# Patient Record
Sex: Female | Born: 1990 | Race: White | Hispanic: No | Marital: Single | Smoking: Current every day smoker
Health system: Southern US, Community
[De-identification: ages and names within clinical notes are randomized; demographics above are authoritative.]

## PROBLEM LIST (undated history)

## (undated) ENCOUNTER — Emergency Department (HOSPITAL_COMMUNITY): Admission: EM | Payer: Commercial Managed Care - PPO | Source: Home / Self Care

## (undated) ENCOUNTER — Emergency Department (HOSPITAL_COMMUNITY): Admission: EM | Discharge status: Absent without leave | Payer: Commercial Managed Care - PPO

## (undated) DIAGNOSIS — F419 Anxiety disorder, unspecified: Secondary | ICD-10-CM

## (undated) DIAGNOSIS — Q211 Atrial septal defect, unspecified: Secondary | ICD-10-CM

## (undated) DIAGNOSIS — F1910 Other psychoactive substance abuse, uncomplicated: Secondary | ICD-10-CM

## (undated) DIAGNOSIS — F329 Major depressive disorder, single episode, unspecified: Secondary | ICD-10-CM

## (undated) DIAGNOSIS — Z7289 Other problems related to lifestyle: Secondary | ICD-10-CM

## (undated) DIAGNOSIS — F489 Nonpsychotic mental disorder, unspecified: Secondary | ICD-10-CM

## (undated) DIAGNOSIS — R011 Cardiac murmur, unspecified: Secondary | ICD-10-CM

## (undated) DIAGNOSIS — F32A Depression, unspecified: Secondary | ICD-10-CM

## (undated) DIAGNOSIS — F319 Bipolar disorder, unspecified: Secondary | ICD-10-CM

## (undated) HISTORY — DX: Major depressive disorder, single episode, unspecified: F32.9

## (undated) HISTORY — DX: Other psychoactive substance abuse, uncomplicated: F19.10

## (undated) HISTORY — PX: ASD REPAIR: SHX258

## (undated) HISTORY — DX: Anxiety disorder, unspecified: F41.9

## (undated) HISTORY — DX: Depression, unspecified: F32.A

## (undated) HISTORY — DX: Cardiac murmur, unspecified: R01.1

---
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
## 2010-02-16 ENCOUNTER — Encounter: Payer: Self-pay | Admitting: Obstetrics

## 2010-02-25 NOTE — Miscellaneous (Signed)
  Clinical Lists Changes  Problems: Removed problem of ACUTE BRONCHITIS (ICD-466.0) Removed problem of ANKLE SPRAIN, RIGHT (ICD-845.00) Removed problem of Question of  ABSCESS, PERITONSILLAR, RIGHT (ICD-475) Removed problem of SORE THROAT (ICD-462) Removed problem of DIARRHEA OF PRESUMED INFECTIOUS ORIGIN (ICD-009.3)

## 2010-03-05 ENCOUNTER — Encounter: Payer: Self-pay | Admitting: *Deleted
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
## 2010-06-30 ENCOUNTER — Emergency Department (HOSPITAL_COMMUNITY)
Admission: EM | Admit: 2010-06-30 | Discharge: 2010-06-30 | Disposition: A | Discharge status: Home / Self Care | Payer: BC Managed Care – PPO | Attending: Emergency Medicine | Admitting: Emergency Medicine

## 2010-06-30 DIAGNOSIS — F329 Major depressive disorder, single episode, unspecified: Secondary | ICD-10-CM | POA: Insufficient documentation

## 2010-06-30 DIAGNOSIS — T424X1A Poisoning by benzodiazepines, accidental (unintentional), initial encounter: Secondary | ICD-10-CM | POA: Insufficient documentation

## 2010-06-30 DIAGNOSIS — T43694A Poisoning by other psychostimulants, undetermined, initial encounter: Secondary | ICD-10-CM | POA: Insufficient documentation

## 2010-06-30 DIAGNOSIS — T424X4A Poisoning by benzodiazepines, undetermined, initial encounter: Secondary | ICD-10-CM | POA: Insufficient documentation

## 2010-06-30 DIAGNOSIS — Z79899 Other long term (current) drug therapy: Secondary | ICD-10-CM | POA: Insufficient documentation

## 2010-06-30 DIAGNOSIS — T43601A Poisoning by unspecified psychostimulants, accidental (unintentional), initial encounter: Secondary | ICD-10-CM | POA: Insufficient documentation

## 2010-06-30 DIAGNOSIS — F3289 Other specified depressive episodes: Secondary | ICD-10-CM | POA: Insufficient documentation

## 2010-06-30 DIAGNOSIS — R1110 Vomiting, unspecified: Secondary | ICD-10-CM | POA: Insufficient documentation

## 2010-06-30 LAB — URINALYSIS, ROUTINE W REFLEX MICROSCOPIC
Bilirubin Urine: NEGATIVE
Glucose, UA: NEGATIVE mg/dL
Hgb urine dipstick: NEGATIVE
Ketones, ur: NEGATIVE mg/dL
Protein, ur: NEGATIVE mg/dL
Specific Gravity, Urine: 1.026 (ref 1.005–1.030)
pH: 5.5 (ref 5.0–8.0)

## 2010-06-30 LAB — COMPREHENSIVE METABOLIC PANEL
Albumin: 4 g/dL (ref 3.5–5.2)
Alkaline Phosphatase: 58 U/L (ref 39–117)
CO2: 24 mEq/L (ref 19–32)
Calcium: 9 mg/dL (ref 8.4–10.5)
Chloride: 104 mEq/L (ref 96–112)
GFR calc Af Amer: >60 mL/min (ref >60)
Potassium: 4.1 mEq/L (ref 3.5–5.1)
Total Protein: 7.1 g/dL (ref 6.0–8.3)

## 2010-06-30 LAB — RAPID URINE DRUG SCREEN, HOSP PERFORMED
Barbiturates: NOT DETECTED
Cocaine: NOT DETECTED

## 2010-06-30 LAB — ACETAMINOPHEN LEVEL: Acetaminophen (Tylenol), Serum: <15 ug/mL (ref 10–30)

## 2010-06-30 LAB — URINE MICROSCOPIC-ADD ON

## 2010-07-01 LAB — URINE CULTURE

## 2010-10-16 LAB — DIFFERENTIAL
Basophils Absolute: 0
Basophils Relative: 0
Eosinophils Absolute: 0.3
Eosinophils Relative: 3
Lymphocytes Relative: 30
Lymphs Abs: 2.8
Monocytes Absolute: 0.9
Monocytes Absolute: 0.6
Monocytes Relative: 9
Neutro Abs: 2.9
Neutrophils Relative %: 44

## 2010-10-16 LAB — COMPREHENSIVE METABOLIC PANEL
ALT: 12
Albumin: 3.3 — ABNORMAL LOW
BUN: 7
Calcium: 9.1
Glucose, Bld: 94
Potassium: 4
Sodium: 139
Total Protein: 6.1

## 2010-10-16 LAB — HEPATIC FUNCTION PANEL
ALT: 14
AST: 15
Alkaline Phosphatase: 58
Bilirubin, Direct: 0.1
Indirect Bilirubin: 0.4

## 2010-10-16 LAB — DRUGS OF ABUSE SCREEN W/O ALC, ROUTINE URINE
Amphetamine Screen, Ur: NEGATIVE
Barbiturate Quant, Ur: NEGATIVE
Benzodiazepines.: NEGATIVE
Phencyclidine (PCP): NEGATIVE
Propoxyphene: NEGATIVE

## 2010-10-16 LAB — URINALYSIS, ROUTINE W REFLEX MICROSCOPIC
Bilirubin Urine: NEGATIVE
Bilirubin Urine: NEGATIVE
Glucose, UA: NEGATIVE
Glucose, UA: NEGATIVE
Hgb urine dipstick: NEGATIVE
Nitrite: NEGATIVE
Protein, ur: NEGATIVE
Specific Gravity, Urine: 1.024
Urobilinogen, UA: 0.2
pH: 6

## 2010-10-16 LAB — CBC
HCT: 44.2
Hemoglobin: 15.2
Hemoglobin: 13.4
MCHC: 34.4
MCHC: 34.7
MCV: 87
Platelets: 350
Platelets: 276
RDW: 14.5
RDW: 14.2

## 2010-10-16 LAB — BASIC METABOLIC PANEL
BUN: 11
CO2: 26
Glucose, Bld: 94
Potassium: 3.8
Potassium: 3.8
Sodium: 139
Sodium: 135

## 2010-10-16 LAB — THC (MARIJUANA), URINE, CONFIRMATION: Marijuana, Ur-Confirmation: 180 ng/mL

## 2010-10-16 LAB — URINE MICROSCOPIC-ADD ON

## 2010-10-16 LAB — T4, FREE: Free T4: 1.07

## 2010-10-16 LAB — LITHIUM LEVEL
Lithium Lvl: 0.29 — ABNORMAL LOW
Lithium Lvl: 1.14

## 2010-10-16 LAB — PREGNANCY, URINE: Preg Test, Ur: NEGATIVE

## 2010-10-16 LAB — TSH: TSH: 4.078

## 2010-10-16 LAB — GC/CHLAMYDIA PROBE AMP, URINE: GC Probe Amp, Urine: NEGATIVE

## 2010-11-05 LAB — I-STAT 8, (EC8 V) (CONVERTED LAB)
Acid-base deficit: 1
Bicarbonate: 23.4
TCO2: 24
pCO2, Ven: 36.2 — ABNORMAL LOW
pH, Ven: 7.419 — ABNORMAL HIGH

## 2010-11-05 LAB — COMPREHENSIVE METABOLIC PANEL
Albumin: 3.5
BUN: 10
Creatinine, Ser: 0.8
Potassium: 3.7
Total Protein: 6.9

## 2010-11-05 LAB — POCT I-STAT CREATININE
Creatinine, Ser: 0.8
Operator id: 272551

## 2010-11-05 LAB — DIFFERENTIAL
Lymphocytes Relative: 18 — ABNORMAL LOW
Monocytes Absolute: 0.8
Monocytes Relative: 7
Neutro Abs: 7.5 — ABNORMAL HIGH

## 2010-11-05 LAB — POCT PREGNANCY, URINE
Operator id: 272551
Preg Test, Ur: NEGATIVE

## 2010-11-05 LAB — SALICYLATE LEVEL: Salicylate Lvl: <4

## 2010-11-05 LAB — CBC
HCT: 45.1
MCV: 86.3
Platelets: 332 — ABNORMAL HIGH
RDW: 13.8

## 2010-11-05 LAB — URINE MICROSCOPIC-ADD ON

## 2010-11-05 LAB — URINALYSIS, ROUTINE W REFLEX MICROSCOPIC
Hgb urine dipstick: NEGATIVE
Nitrite: NEGATIVE
Protein, ur: 30 — AB
Urobilinogen, UA: 0.2

## 2010-11-05 LAB — RAPID URINE DRUG SCREEN, HOSP PERFORMED
Amphetamines: NOT DETECTED
Barbiturates: NOT DETECTED
Benzodiazepines: NOT DETECTED

## 2010-11-05 LAB — ETHANOL: Alcohol, Ethyl (B): <5

## 2010-11-11 LAB — DIFFERENTIAL
Eosinophils Absolute: 0.4
Lymphs Abs: 2.8
Neutro Abs: 3.8
Neutrophils Relative %: 49

## 2010-11-11 LAB — DRUGS OF ABUSE SCREEN W/O ALC, ROUTINE URINE
Amphetamine Screen, Ur: NEGATIVE
Benzodiazepines.: NEGATIVE
Cocaine Metabolites: NEGATIVE
Opiate Screen, Urine: NEGATIVE
Phencyclidine (PCP): NEGATIVE
Phencyclidine (PCP): NEGATIVE
Propoxyphene: NEGATIVE
Propoxyphene: NEGATIVE

## 2010-11-11 LAB — T4, FREE: Free T4: 0.94

## 2010-11-11 LAB — URINALYSIS, ROUTINE W REFLEX MICROSCOPIC
Bilirubin Urine: NEGATIVE
Ketones, ur: NEGATIVE
Nitrite: NEGATIVE
Urobilinogen, UA: 0.2

## 2010-11-11 LAB — CBC
MCV: 86.7
Platelets: 303
WBC: 7.8

## 2010-11-11 LAB — GAMMA GT: GGT: 12

## 2010-11-11 LAB — THC (MARIJUANA), URINE, CONFIRMATION: Marijuana, Ur-Confirmation: 140 ng/mL

## 2010-11-11 LAB — HEPATIC FUNCTION PANEL
Alkaline Phosphatase: 42 — ABNORMAL LOW
Indirect Bilirubin: 0.3
Total Bilirubin: 0.4
Total Protein: 6.1

## 2010-11-11 LAB — BASIC METABOLIC PANEL
BUN: 8
Calcium: 8.9
Chloride: 109
Creatinine, Ser: 0.82

## 2010-11-11 LAB — PREGNANCY, URINE: Preg Test, Ur: NEGATIVE

## 2010-11-11 LAB — URINE CULTURE
Culture: NO GROWTH
Special Requests: NEGATIVE

## 2010-11-24 ENCOUNTER — Emergency Department (HOSPITAL_COMMUNITY)
Admission: EM | Admit: 2010-11-24 | Discharge: 2010-11-26 | Disposition: A | Discharge status: Other Acute Inpatient Hospital | Payer: BC Managed Care – PPO | Attending: Emergency Medicine | Admitting: Emergency Medicine

## 2010-11-24 DIAGNOSIS — F319 Bipolar disorder, unspecified: Secondary | ICD-10-CM | POA: Insufficient documentation

## 2010-11-25 LAB — CBC
HCT: 42.6 % (ref 36.0–46.0)
MCH: 31.6 pg (ref 26.0–34.0)
MCHC: 35 g/dL (ref 30.0–36.0)
RDW: 12.9 % (ref 11.5–15.5)

## 2010-11-25 LAB — DIFFERENTIAL
Basophils Absolute: 0 10*3/uL (ref 0.0–0.1)
Eosinophils Relative: 3 % (ref 0–5)
Lymphocytes Relative: 35 % (ref 12–46)
Monocytes Absolute: 0.9 10*3/uL (ref 0.1–1.0)
Monocytes Relative: 9 % (ref 3–12)

## 2010-11-25 LAB — COMPREHENSIVE METABOLIC PANEL
ALT: 12 U/L (ref 0–35)
AST: 15 U/L (ref 0–37)
Alkaline Phosphatase: 60 U/L (ref 39–117)
CO2: 25 mEq/L (ref 19–32)
Chloride: 101 mEq/L (ref 96–112)
GFR calc non Af Amer: >90 mL/min (ref >90)
Potassium: 3.8 mEq/L (ref 3.5–5.1)
Sodium: 136 mEq/L (ref 135–145)
Total Bilirubin: 0.2 mg/dL — ABNORMAL LOW (ref 0.3–1.2)

## 2010-11-25 LAB — RAPID URINE DRUG SCREEN, HOSP PERFORMED
Barbiturates: NOT DETECTED
Cocaine: NOT DETECTED

## 2010-11-26 ENCOUNTER — Inpatient Hospital Stay (HOSPITAL_COMMUNITY)
Admission: RE | Admit: 2010-11-26 | Discharge: 2010-12-01 | DRG: 430 | Disposition: A | Discharge status: Home / Self Care | Payer: BC Managed Care – PPO | Source: Ambulatory Visit | Attending: Psychiatry | Admitting: Psychiatry

## 2010-11-26 DIAGNOSIS — F3110 Bipolar disorder, current episode manic without psychotic features, unspecified: Principal | ICD-10-CM

## 2010-11-26 DIAGNOSIS — Z560 Unemployment, unspecified: Secondary | ICD-10-CM

## 2010-11-26 DIAGNOSIS — F3112 Bipolar disorder, current episode manic without psychotic features, moderate: Secondary | ICD-10-CM

## 2010-11-26 DIAGNOSIS — F1210 Cannabis abuse, uncomplicated: Secondary | ICD-10-CM

## 2010-11-26 DIAGNOSIS — R5383 Other fatigue: Secondary | ICD-10-CM

## 2010-11-27 DIAGNOSIS — F319 Bipolar disorder, unspecified: Secondary | ICD-10-CM

## 2010-11-28 MED ORDER — RISPERIDONE 2 MG PO TABS
2.0000 mg | ORAL_TABLET | Freq: Every day | ORAL | Status: DC
  Administered 2010-11-29 – 2010-11-30 (×2): 2 mg via ORAL
  Filled 2010-11-28 (×2): qty 1

## 2010-11-28 MED ORDER — BISACODYL 5 MG PO TBEC: 5.0000 mg | DELAYED_RELEASE_TABLET | Freq: Two times a day (BID) | ORAL | Status: DC | PRN

## 2010-11-28 MED ORDER — DIPHENHYDRAMINE HCL 25 MG PO CAPS
25.0000 mg | ORAL_CAPSULE | Freq: Every day | ORAL | Status: DC
  Administered 2010-11-29: 25 mg via ORAL
  Filled 2010-11-28 (×2): qty 1

## 2010-11-28 MED ORDER — ACETAMINOPHEN 325 MG PO TABS: 650.0000 mg | ORAL_TABLET | Freq: Four times a day (QID) | ORAL | Status: DC | PRN

## 2010-11-28 MED ORDER — NICOTINE 21 MG/24HR TD PT24: 21.0000 mg | MEDICATED_PATCH | Freq: Every day | TRANSDERMAL | Status: DC

## 2010-11-28 MED ORDER — MAGNESIUM HYDROXIDE 400 MG/5ML PO SUSP: 30.0000 mL | Freq: Every day | ORAL | Status: DC | PRN

## 2010-11-28 MED ORDER — RISPERIDONE 0.25 MG PO TABS: 0.2500 mg | ORAL_TABLET | ORAL | Status: DC | PRN

## 2010-11-28 MED ORDER — ALUM & MAG HYDROXIDE-SIMETH 200-200-20 MG/5ML PO SUSP: 30.0000 mL | ORAL | Status: DC | PRN

## 2010-11-30 MED ORDER — RISPERIDONE 0.25 MG PO TABS
0.2500 mg | ORAL_TABLET | ORAL | Status: DC | PRN
  Administered 2010-11-30 (×2): 0.25 mg via ORAL
  Filled 2010-11-30 (×2): qty 1

## 2010-11-30 NOTE — Progress Notes (Signed)
  Patient was seen in group today. Patient had very bright smile on her face and was containing herself better than she had yesterday. She asked appropriate questions and waiting her turn better than she had yesterday. It is noted that she has Risperdal 0.25 mg ordered for every 4 hours for anxiety.  It is now modified to be every 2 hours and to also include mood difficulties as an indication.  Patient reported that "relieved" is the mood that she desires to have today.  She listed her anxiety ans depression as 3 to 4.  She is excited that she will be getting her favorite [place] food tomorrow.  She is confused as to why she has to have a new psychiatrist.  Francis Dowse has an appointment with them on Wednesday.

## 2010-11-30 NOTE — Progress Notes (Signed)
[facility] Group Notes:  (Counselor/Nursing/MHT/Case Management/Adjunct)  11/30/2010 1:15 PM  Type of Therapy:  Group Therapy, Dance/Movement Therapy   Participation Level:  Active  Participation Quality:  Attentive, Sharing and Supportive  Affect:  Appropriate  Cognitive:  Appropriate  Insight:  Limited  Engagement in Group:  Good  Engagement in Therapy:  Limited  Modes of Intervention:  Clarification, Problem-solving, Role-play, Socialization and Support  Summary of Progress/Problems: pt participated in a group discussion on how to use supports to change negative cycles of relapse. Pt identified one positive support as her family and peers.  Pt spoke about wanting to help others and an unhealthy connection to peers and peer support. Gevena Mart

## 2010-11-30 NOTE — Progress Notes (Signed)
  In followup individual session the patient does disclose that she is feeling very positive about her experience here at [facility] and stated "this is the first time I've left a place where I didn't hate someone".  This was the most impressive statement for her in that she has described herself as typically being very obnoxious patient most of the time but when people here have been clear with her and instructed her and have given her guidance she's been very interested in trying to make use of that guidance, applying it learn, and act differently. She was informed that tobacco use is a gateway drug back into using alcohol and was advised to try to focus on getting off of that in the next 6 months. She described getting off of it as going to be very difficult.

## 2010-11-30 NOTE — Progress Notes (Signed)
  11-30-10 pt has had Risperdal prn x 2 today due to anxiety/agitation. On her self inventory she wrote slept well, appetite good, energy normal, attention good, depression 1 and hopelessness 1.  No w/d symptoms and no si. She not having any physical problems. Rn will monitor and every  15 min checks continue.

## 2010-12-01 MED ORDER — RISPERIDONE 0.25 MG PO TABS: 0.2500 mg | ORAL_TABLET | Freq: Three times a day (TID) | ORAL | Status: DC

## 2010-12-01 MED ORDER — RISPERIDONE 2 MG PO TABS: 2.0000 mg | ORAL_TABLET | Freq: Every day | ORAL | Status: DC

## 2010-12-01 NOTE — Progress Notes (Signed)
NURS:  Pt has been up and active in group.  Denies SI/HI/hallucinations.  Pt more appropriate on unit today, less manic.  No complaints voiced.  Support and encouragement offered.  Will continue to monitor.
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
## 2010-12-01 NOTE — Progress Notes (Addendum)
Suicide Risk Assessment  Discharge Assessment     Demographic factors:    Current Mental Status:    Risk Reduction Factors:     CLINICAL FACTORS:   Severe Anxiety and/or Agitation 2 on scale of 1 least and 10 the most Bipolar Disorder:  Type 1 Depression: 1 on same scale Alcohol/Substance Abuse/Dependencies Previous Psychiatric Diagnoses and Treatments inpatient as well as outpatient   Medical Diagnoses and Treatments/Surgeries   COGNITIVE FEATURES THAT CONTRIBUTE TO RISK:  No outstanding cognitive risk factors    SUICIDE RISK:   Minimal: No identifiable suicidal ideation.  Patients presenting with no risk factors but with morbid ruminations; may be classified as minimal risk based on the severity of the depressive symptoms  Patient denies suicidal or homicidal ideation, hallucinations, illusions, or delusions. Patient engages with good eye contact, is able to focus adequately in a one to one setting, and has clear goal directed thoughts. Patient speaks with a natural conversational volume, rate, and tone. Patient is oriented times 4, recent and remote memory intact. Judgement: Gaining in understanding about her mental illness and her addictions Insight: is eager to learn ways to improve herself Plan: [facility]  PLAN OF CARE:  Orson Aloe 12/01/2010, 11:36 AM

## 2010-12-01 NOTE — Progress Notes (Signed)
[facility] Group Notes:  (Counselor/Nursing/MHT/Case Management/Adjunct)  12/01/2010 2:30 PM  Type of Therapy:  Grup Therapy at 11:00AM  Participation Level:  Did Not Attend   Clide Dales 12/01/2010, 2:30 PM

## 2010-12-01 NOTE — Progress Notes (Signed)
Nursing Discharge note Pt discharged to follow up at [facility] downstairs.All instructions, belongings,sample meds and Suicide prevention Pamphlet given to patient.She denies S/I,H/I and A/v hallucinations.Escorted to lobby.

## 2010-12-01 NOTE — Discharge Summary (Deleted)
Discharge Note  Inpatient Discharge Instructions:    Date of Admission:  11/26/2010  Date of Discharge:  @dischargeDT @  Axis Diagnosis:  Bipolar Disorder Type 1    Cannabis Abuse  Level of Care:  IOP  Discharge destination:  Home  Is patient on multiple antipsychotic therapies at discharge:  No    Patient phone:  [phone] (home)  Patient address:   [address] [place] [address],  The patient received suicide prevention pamphlet:  Yes Belongings returned:  Judene Companion, Apryl Brymer 12/01/2010, 12:02 PM

## 2010-12-01 NOTE — Progress Notes (Signed)
[facility] Group Notes:  (Counselor/Nursing/MHT/Case Management/Adjunct)  12/01/2010 2:31 PM  Type of Therapy:  Group Therapy 1:15PM  Participation Level:  Minimal  Participation Quality:  Sharing  Affect:  Excited  Cognitive:  Oriented  Insight:  Limited  Engagement in Group:  Limited  Engagement in Therapy:  Limited  Modes of Intervention:  Clarification, Orientation and Support  Summary of Progress/Problems: Pt attended introduction of group before called out for discharge. Pt was very excited to be leaving and shared that "I am grateful for all the good help I received here and also grateful for my two perfectly good legs that work so well" Pt hugged each group member before leaving the room.   Clide Dales 12/01/2010, 2:31 PM

## 2010-12-01 NOTE — Progress Notes (Deleted)
[facility] Group Notes:  (Counselor/Nursing/MHT/Case Management/Adjunct)  12/01/2010 12:20 PM  Type of Therapy:  Group Processing  Participation Level:  Did Not Attend  Participation Quality:  NONE ~ Pt did not attend  Affect:  Pt did not attend  Cognitive: Pt did not attend  Insight:  Pt did not attend  Engagement in Group: Pt did not attend  Engagement in Therapy:  Pt did not attend  Modes of Intervention:  None - PT did not attend  Summary of Progress/Problems: NONE ~ Pt did not attend  Clide Dales 12/01/2010, 12:20 PM

## 2010-12-02 ENCOUNTER — Encounter (HOSPITAL_COMMUNITY): Payer: Self-pay | Admitting: Psychology
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
## 2010-12-03 ENCOUNTER — Other Ambulatory Visit (HOSPITAL_COMMUNITY): Payer: BC Managed Care – PPO | Attending: Physician Assistant | Admitting: Psychology

## 2010-12-03 DIAGNOSIS — F3110 Bipolar disorder, current episode manic without psychotic features, unspecified: Secondary | ICD-10-CM | POA: Insufficient documentation

## 2010-12-03 DIAGNOSIS — F1210 Cannabis abuse, uncomplicated: Secondary | ICD-10-CM | POA: Insufficient documentation
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
## 2010-12-04 ENCOUNTER — Other Ambulatory Visit (HOSPITAL_COMMUNITY): Payer: Self-pay | Admitting: *Deleted

## 2010-12-04 DIAGNOSIS — F1920 Other psychoactive substance dependence, uncomplicated: Secondary | ICD-10-CM

## 2010-12-04 NOTE — Progress Notes (Signed)
    Daily Group Progress Note  Program: CD-IOP   Group Time: 1-2:30 pm  Participation Level: Active  Behavioral Response: Sharing, Attention-Seeking and Intrusive  Type of Therapy: Psycho-education Group  Topic: The Disease Concept of Addiction; A presentation was provided on the chronic nature of addiction. Emphasis was placed on "managing" the disease through a daily program. The importance of routine in observing the daily schedule was reiterated. The session demonstrated that living a quality life through daily management of symptoms is very possible. There was good disclosure among group members and many agreed that complacency had brought about their relapses in the past.         Group Time: 2:45-4 pm  Participation Level: Active  Behavioral Response: Sharing and Grandiose  Type of Therapy: Process Group  Topic: Group Process and Graduation; second half of session spent in process. Members shared their feelings about current struggles and issues in early recovery. Near the end of the session a graduation ceremony was held for a member graduating successfully from the program.   Summary: Patient arrived 15 minutes late for group. She was distracting as she settled into a seat. She shared about her struggles when asked to introduce herself. She was overbearing and had to be asked to stop talking while other members were sharing. She displayed poor boundaries as she spilled out the extent of her behaviors very quickly and vacillated between laughter and tears. She over-reacted when she learned another member played soccer and she challenged him to a game. She received support from her new group members. With 30 minutes remaining in the session, the patient stood up and reported she had to leave. She seemed upset, but nothing had been witnessed that would have been upsetting to her. I asked her to remain until the session ended, but she apologized and explained she had to go. Her  sudden departure proved distracting and inexplicable.   Family Program: Family present? No   Name of family member(s):   UDS collected: No Results: negative  AA/NA attended?: YesMonday, Tuesday and Wednesday  Sponsor?: No   Talecia Sherlin, LCAS

## 2010-12-05 ENCOUNTER — Other Ambulatory Visit (HOSPITAL_COMMUNITY): Payer: BC Managed Care – PPO | Admitting: *Deleted
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
## 2010-12-08 ENCOUNTER — Other Ambulatory Visit (HOSPITAL_COMMUNITY): Payer: BC Managed Care – PPO

## 2010-12-08 ENCOUNTER — Telehealth (HOSPITAL_COMMUNITY): Payer: Self-pay | Admitting: *Deleted

## 2010-12-10 ENCOUNTER — Other Ambulatory Visit (HOSPITAL_COMMUNITY): Payer: BC Managed Care – PPO

## 2010-12-12 ENCOUNTER — Other Ambulatory Visit (HOSPITAL_COMMUNITY): Payer: BC Managed Care – PPO

## 2010-12-15 ENCOUNTER — Other Ambulatory Visit (HOSPITAL_COMMUNITY): Payer: BC Managed Care – PPO

## 2010-12-17 ENCOUNTER — Other Ambulatory Visit (HOSPITAL_COMMUNITY): Payer: BC Managed Care – PPO

## 2010-12-19 ENCOUNTER — Other Ambulatory Visit (HOSPITAL_COMMUNITY): Payer: BC Managed Care – PPO

## 2010-12-22 ENCOUNTER — Other Ambulatory Visit (HOSPITAL_COMMUNITY): Payer: BC Managed Care – PPO

## 2010-12-24 ENCOUNTER — Other Ambulatory Visit (HOSPITAL_COMMUNITY): Payer: BC Managed Care – PPO

## 2010-12-26 ENCOUNTER — Other Ambulatory Visit (HOSPITAL_COMMUNITY): Payer: BC Managed Care – PPO

## 2010-12-29 ENCOUNTER — Other Ambulatory Visit (HOSPITAL_COMMUNITY): Payer: BC Managed Care – PPO | Attending: Physician Assistant

## 2010-12-31 ENCOUNTER — Other Ambulatory Visit (HOSPITAL_COMMUNITY): Payer: BC Managed Care – PPO

## 2011-01-02 ENCOUNTER — Other Ambulatory Visit (HOSPITAL_COMMUNITY): Payer: BC Managed Care – PPO

## 2011-01-05 ENCOUNTER — Other Ambulatory Visit (HOSPITAL_COMMUNITY): Payer: BC Managed Care – PPO

## 2011-01-07 ENCOUNTER — Other Ambulatory Visit (HOSPITAL_COMMUNITY): Payer: BC Managed Care – PPO

## 2011-01-09 ENCOUNTER — Other Ambulatory Visit (HOSPITAL_COMMUNITY): Payer: BC Managed Care – PPO

## 2011-01-12 ENCOUNTER — Other Ambulatory Visit (HOSPITAL_COMMUNITY): Payer: BC Managed Care – PPO

## 2011-01-14 ENCOUNTER — Other Ambulatory Visit (HOSPITAL_COMMUNITY): Payer: BC Managed Care – PPO

## 2011-01-16 ENCOUNTER — Other Ambulatory Visit (HOSPITAL_COMMUNITY): Payer: BC Managed Care – PPO

## 2011-01-19 ENCOUNTER — Other Ambulatory Visit (HOSPITAL_COMMUNITY): Payer: BC Managed Care – PPO

## 2011-01-21 ENCOUNTER — Other Ambulatory Visit (HOSPITAL_COMMUNITY): Payer: BC Managed Care – PPO

## 2011-01-23 ENCOUNTER — Other Ambulatory Visit (HOSPITAL_COMMUNITY): Payer: BC Managed Care – PPO

## 2011-01-26 ENCOUNTER — Other Ambulatory Visit (HOSPITAL_COMMUNITY): Payer: BC Managed Care – PPO

## 2011-03-15 ENCOUNTER — Encounter (HOSPITAL_COMMUNITY): Payer: Self-pay | Admitting: Emergency Medicine

## 2011-03-15 ENCOUNTER — Emergency Department (HOSPITAL_COMMUNITY)
Admission: EM | Admit: 2011-03-15 | Discharge: 2011-03-16 | Disposition: A | Discharge status: Other Acute Inpatient Hospital | Payer: BC Managed Care – PPO | Attending: Emergency Medicine | Admitting: Emergency Medicine

## 2011-03-15 DIAGNOSIS — F23 Brief psychotic disorder: Secondary | ICD-10-CM

## 2011-03-15 DIAGNOSIS — F29 Unspecified psychosis not due to a substance or known physiological condition: Secondary | ICD-10-CM | POA: Insufficient documentation

## 2011-03-15 HISTORY — DX: Nonpsychotic mental disorder, unspecified: F48.9

## 2011-03-15 LAB — CBC
MCH: 31.1 pg (ref 26.0–34.0)
MCHC: 33.9 g/dL (ref 30.0–36.0)
MCV: 91.6 fL (ref 78.0–100.0)
Platelets: 265 10*3/uL (ref 150–400)
RBC: 4.76 MIL/uL (ref 3.87–5.11)

## 2011-03-15 LAB — COMPREHENSIVE METABOLIC PANEL
AST: 15 U/L (ref 0–37)
CO2: 25 mEq/L (ref 19–32)
Calcium: 9 mg/dL (ref 8.4–10.5)
Creatinine, Ser: 0.79 mg/dL (ref 0.50–1.10)
GFR calc Af Amer: >90 mL/min (ref >90)
GFR calc non Af Amer: >90 mL/min (ref >90)
Glucose, Bld: 105 mg/dL — ABNORMAL HIGH (ref 70–99)
Sodium: 139 mEq/L (ref 135–145)
Total Protein: 7 g/dL (ref 6.0–8.3)

## 2011-03-15 LAB — ACETAMINOPHEN LEVEL: Acetaminophen (Tylenol), Serum: <15 ug/mL (ref 10–30)

## 2011-03-15 LAB — RAPID URINE DRUG SCREEN, HOSP PERFORMED
Cocaine: POSITIVE — AB
Opiates: NOT DETECTED

## 2011-03-15 MED ORDER — IBUPROFEN 600 MG PO TABS: 600.0000 mg | ORAL_TABLET | Freq: Three times a day (TID) | ORAL | Status: DC | PRN

## 2011-03-15 MED ORDER — LORAZEPAM 1 MG PO TABS
1.0000 mg | ORAL_TABLET | Freq: Three times a day (TID) | ORAL | Status: DC | PRN
  Administered 2011-03-16 (×2): 1 mg via ORAL
  Filled 2011-03-15 (×2): qty 1

## 2011-03-15 MED ORDER — ZIPRASIDONE MESYLATE 20 MG IM SOLR
INTRAMUSCULAR | Status: AC
  Administered 2011-03-15: 20 mg via INTRAMUSCULAR
  Filled 2011-03-15: qty 20

## 2011-03-15 MED ORDER — ACETAMINOPHEN 325 MG PO TABS: 650.0000 mg | ORAL_TABLET | ORAL | Status: DC | PRN

## 2011-03-15 MED ORDER — ZIPRASIDONE MESYLATE 20 MG IM SOLR: 20.0000 mg | Freq: Once | INTRAMUSCULAR | Status: AC

## 2011-03-15 NOTE — ED Notes (Signed)
Pt now calm, thankful and respectful, restraints removed. Pt agrees to conditions of removal.
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
## 2011-03-16 ENCOUNTER — Inpatient Hospital Stay (HOSPITAL_COMMUNITY)
Admission: EM | Admit: 2011-03-16 | Discharge: 2011-03-31 | DRG: 430 | Payer: BC Managed Care – PPO | Attending: Psychiatry | Admitting: Psychiatry

## 2011-03-16 ENCOUNTER — Encounter (HOSPITAL_COMMUNITY): Payer: Self-pay | Admitting: *Deleted

## 2011-03-16 DIAGNOSIS — R45850 Homicidal ideations: Secondary | ICD-10-CM

## 2011-03-16 DIAGNOSIS — F1410 Cocaine abuse, uncomplicated: Secondary | ICD-10-CM

## 2011-03-16 DIAGNOSIS — F319 Bipolar disorder, unspecified: Secondary | ICD-10-CM

## 2011-03-16 DIAGNOSIS — F1210 Cannabis abuse, uncomplicated: Secondary | ICD-10-CM | POA: Diagnosis present

## 2011-03-16 DIAGNOSIS — F250 Schizoaffective disorder, bipolar type: Secondary | ICD-10-CM | POA: Diagnosis present

## 2011-03-16 DIAGNOSIS — F3110 Bipolar disorder, current episode manic without psychotic features, unspecified: Secondary | ICD-10-CM

## 2011-03-16 DIAGNOSIS — Z79899 Other long term (current) drug therapy: Secondary | ICD-10-CM

## 2011-03-16 DIAGNOSIS — F17200 Nicotine dependence, unspecified, uncomplicated: Secondary | ICD-10-CM

## 2011-03-16 DIAGNOSIS — F259 Schizoaffective disorder, unspecified: Principal | ICD-10-CM

## 2011-03-16 HISTORY — DX: Atrial septal defect: Q21.1

## 2011-03-16 HISTORY — DX: Atrial septal defect, unspecified: Q21.10

## 2011-03-16 MED ORDER — ALUM & MAG HYDROXIDE-SIMETH 200-200-20 MG/5ML PO SUSP: 30.0000 mL | ORAL | Status: DC | PRN

## 2011-03-16 MED ORDER — ZIPRASIDONE MESYLATE 20 MG IM SOLR: 20.0000 mg | Freq: Once | INTRAMUSCULAR | Status: DC

## 2011-03-16 MED ORDER — MAGNESIUM HYDROXIDE 400 MG/5ML PO SUSP: 30.0000 mL | Freq: Every day | ORAL | Status: DC | PRN

## 2011-03-16 MED ORDER — ZIPRASIDONE MESYLATE 20 MG IM SOLR
INTRAMUSCULAR | Status: AC
  Administered 2011-03-16: 20 mg
  Filled 2011-03-16: qty 20

## 2011-03-16 MED ORDER — HYDROXYZINE PAMOATE 25 MG PO CAPS
25.0000 mg | ORAL_CAPSULE | Freq: Three times a day (TID) | ORAL | Status: DC | PRN
  Administered 2011-03-16: 25 mg via ORAL

## 2011-03-16 MED ORDER — ACETAMINOPHEN 325 MG PO TABS
650.0000 mg | ORAL_TABLET | Freq: Four times a day (QID) | ORAL | Status: DC | PRN
  Administered 2011-03-23: 650 mg via ORAL

## 2011-03-16 MED ORDER — WHITE PETROLATUM GEL
Status: AC
  Administered 2011-03-16: 11:00:00
  Filled 2011-03-16: qty 5

## 2011-03-16 MED ORDER — ZIPRASIDONE MESYLATE 20 MG IM SOLR: 10.0000 mg | INTRAMUSCULAR | Status: DC | PRN

## 2011-03-16 NOTE — ED Notes (Signed)
Pt. Ate 100% of lunch, brought used tray to RN window.  Pt. Calmer and cooperative, resting in bed, breathing normal.

## 2011-03-16 NOTE — ED Provider Notes (Signed)
BP 104/66  Pulse 68  Temp(Src) 97.8 F (36.6 C) (Oral)  Resp 20  SpO2 99%  Patient agitated, screaming that she wants to go home. Geodon ordered.  Forbes Cellar, MD 03/16/11 1157

## 2011-03-16 NOTE — ED Notes (Signed)
Pt. Given drink, per request.

## 2011-03-16 NOTE — ED Provider Notes (Signed)
,  BP 104/66  Pulse 68  Temp(Src) 97.8 F (36.6 C) (Oral)  Resp 20  SpO2 99%  Per ACT, pt accepted for transfer to [facility], Dr. Liam Rogers.  Forbes Cellar, MD 03/16/11 [phone]

## 2011-03-16 NOTE — BH Assessment (Addendum)
No beds available at [facility], [place], [address], [facility] or [facility]. Pt declined due to unit acuity at [facility]. However, Baxter Hire at [facility] said to call again after 9 am this am to see if their unit has settled down and [facility] may be able to take pt.

## 2011-03-16 NOTE — ED Notes (Signed)
Pt. C/o chapped lips, vaseline given.

## 2011-03-16 NOTE — ED Notes (Signed)
Per pt. Request, given colors/color book.

## 2011-03-16 NOTE — ED Notes (Signed)
Pt. Came to RN window, yelling/screaming "I want to get the fuck out of here", "you can't keep me here". Pt. Would not return to room, holding up phone in hand as if to throw.  [employer]/[facility] called, pt. Still not directable to return to room.  EDP notified of pt. Behavior, medication ordered and given.

## 2011-03-16 NOTE — ED Notes (Signed)
Pt. Moved to Rm.38 r/t pt. Acting inappropriate with another pt. Across the hall in Rm.34.

## 2011-03-16 NOTE — ED Provider Notes (Signed)
BP 104/66  Pulse 68  Temp(Src) 97.8 F (36.6 C) (Oral)  Resp 20  SpO2 99%  Patient evaluated. No new complaints at this time. IVC. Awaiting placement.  Forbes Cellar, MD 03/16/11 [phone]

## 2011-03-16 NOTE — ED Notes (Signed)
Pt accepted to [facility] to [id number]. Report called, left on hold > 5 min. Will call back. [employer] called for transport, since pt is IVC. Pt calm and cooperative at this time.

## 2011-03-16 NOTE — ED Notes (Signed)
Per ACT team, pt. Pending a bed at [facility] today.  ACT will notify RN with bed number/MD to call report.
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
## 2011-03-16 NOTE — Tx Team (Signed)
Initial Interdisciplinary Treatment Plan  PATIENT STRENGTHS: (choose at least two) Active sense of humor Communication skills Physical Health  PATIENT STRESSORS: Reports unemployed and wanting to go back to school   PROBLEM LIST: Problem List/Patient Goals Date to be addressed Date deferred Reason deferred Estimated date of resolution  Psychosis 03/16/11                                                      DISCHARGE CRITERIA:  Ability to meet basic life and health needs Adequate post-discharge living arrangements Improved stabilization in mood, thinking, and/or behavior Motivation to continue treatment in a less acute level of care Need for constant or close observation no longer present Verbal commitment to aftercare and medication compliance  PRELIMINARY DISCHARGE PLAN: Attend aftercare/continuing care group Outpatient therapy Participate in family therapy Placement in alternative living arrangements  PATIENT/FAMIILY INVOLVEMENT: This treatment plan has been presented to and reviewed with the patient, [name].  The patient and family have been given the opportunity to ask questions and make suggestions.  Dyke Brackett 03/16/2011, 9:56 PM

## 2011-03-16 NOTE — ED Notes (Signed)
Report called to [facility], spoke to [facility]. Pt departs facility with [employer] en route to [facility].

## 2011-03-16 NOTE — ED Notes (Signed)
Pt. Asleep, breathing WNL 

## 2011-03-16 NOTE — BH Assessment (Signed)
First opinion re: IVC completed.

## 2011-03-17 DIAGNOSIS — F1210 Cannabis abuse, uncomplicated: Secondary | ICD-10-CM | POA: Diagnosis present

## 2011-03-17 DIAGNOSIS — F250 Schizoaffective disorder, bipolar type: Secondary | ICD-10-CM | POA: Diagnosis present

## 2011-03-17 DIAGNOSIS — F1410 Cocaine abuse, uncomplicated: Secondary | ICD-10-CM | POA: Diagnosis present

## 2011-03-17 LAB — URINALYSIS, ROUTINE W REFLEX MICROSCOPIC
Glucose, UA: NEGATIVE mg/dL
Hgb urine dipstick: NEGATIVE
Leukocytes, UA: NEGATIVE
Specific Gravity, Urine: 1.03 (ref 1.005–1.030)
pH: 5.5 (ref 5.0–8.0)

## 2011-03-17 MED ORDER — LORAZEPAM 1 MG PO TABS
ORAL_TABLET | ORAL | Status: AC
  Administered 2011-03-17: 1 mg via ORAL
  Filled 2011-03-17: qty 1

## 2011-03-17 MED ORDER — HYDROXYZINE PAMOATE 25 MG PO CAPS: 25.0000 mg | ORAL_CAPSULE | Freq: Four times a day (QID) | ORAL | Status: DC | PRN

## 2011-03-17 MED ORDER — LORAZEPAM 1 MG PO TABS
1.0000 mg | ORAL_TABLET | Freq: Four times a day (QID) | ORAL | Status: DC | PRN
  Administered 2011-03-17 – 2011-03-20 (×6): 1 mg via ORAL
  Filled 2011-03-17 (×5): qty 1

## 2011-03-17 MED ORDER — HYDROXYZINE HCL 25 MG PO TABS
25.0000 mg | ORAL_TABLET | Freq: Four times a day (QID) | ORAL | Status: DC | PRN
  Administered 2011-03-19 – 2011-03-30 (×13): 25 mg via ORAL
  Filled 2011-03-17 (×4): qty 1

## 2011-03-17 MED ORDER — HALOPERIDOL 5 MG PO TABS
5.0000 mg | ORAL_TABLET | Freq: Four times a day (QID) | ORAL | Status: DC | PRN
  Administered 2011-03-17 – 2011-03-24 (×5): 5 mg via ORAL
  Filled 2011-03-17 (×4): qty 1

## 2011-03-17 MED ORDER — RISPERIDONE 2 MG PO TBDP
2.0000 mg | ORAL_TABLET | Freq: Every day | ORAL | Status: DC
  Administered 2011-03-17: 2 mg via ORAL
  Filled 2011-03-17 (×2): qty 1

## 2011-03-17 MED ORDER — HALOPERIDOL 5 MG PO TABS
ORAL_TABLET | ORAL | Status: AC
  Administered 2011-03-17: 5 mg via ORAL
  Filled 2011-03-17: qty 1

## 2011-03-17 MED ORDER — DIVALPROEX SODIUM ER 500 MG PO TB24
1000.0000 mg | ORAL_TABLET | Freq: Every day | ORAL | Status: DC
  Administered 2011-03-17 – 2011-03-18 (×2): 1000 mg via ORAL
  Filled 2011-03-17 (×4): qty 2
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
## 2011-03-17 NOTE — Progress Notes (Signed)
Patient has been up and in the milieu most of the day.  Angry this morning about not being able to leave today.  She was yelling and demanding to leave stating it was all a big misunderstanding that led her to be hospitalized.  Minimizes any problems outside of the hospital.  Patient was given a dose of Haldol and Ativan when she was agitated and it was effective.  Now seems to understand that she needs to be here and will be able to go home in a few days.  Has been cooperative this afternoon but often with childlike behaviors.

## 2011-03-17 NOTE — Tx Team (Signed)
Met with patient in Aftercare Planning Group.   She reported being here at [facility] 3 months ago.  She smiled a great deal throughout short interview, and said she would be going home today.  She admitted that upon last discharge she did not feel she needed medication or treatment, so did not stay on meds or follow up.  Cannot remember where she was set up for aftercare.  Case Manager researched last hospitalization, and found that she was set up for [facility] outpatient CD-IOP.  Placed phone call requesting information from that program about patient's participation in the program.  Patient would not say where she would live at discharge.  She reported "Unless there is an apocalypse, I will be discharged today."  No known case management needs today.  Ambrose Mantle, LCSW 03/17/2011, 3:27 PM

## 2011-03-17 NOTE — Progress Notes (Signed)
Approached Clinical research associate requesting prn for anxiety. Appears bright, silly and elated. Cooperative with assessment. No acute distress noted. States she feels like she needs something to take the edge off, but could not identify any one thing or event causing the increased anxiety. Support and encouragement provided. PRN provided as requested. Is childlike and superficial in her interactions. States she has had a "wonderful day" r/t good groups, good food, hanging out with peers and going to the gym. Goal for tomorrow is to go home. Flatly denies AVH, but did smile when Clinical research associate asked these questions. Denies HI/SI and contracts for safety. Denies pain or discomfort. Offers no questions or concerns otherwise. POC and medications for the shift reviewed and understanding verbalized. Safety has been maintained with Q15 minute observation. Will f/u response to prn and continue current POC.

## 2011-03-18 DIAGNOSIS — F259 Schizoaffective disorder, unspecified: Principal | ICD-10-CM

## 2011-03-18 LAB — T3, FREE: T3, Free: 3.8 pg/mL (ref 2.3–4.2)

## 2011-03-18 LAB — TSH: TSH: 1.91 u[IU]/mL (ref 0.350–4.500)

## 2011-03-18 MED ORDER — RISPERIDONE 1 MG PO TBDP
3.0000 mg | ORAL_TABLET | Freq: Every day | ORAL | Status: DC
  Administered 2011-03-18 – 2011-03-19 (×2): 3 mg via ORAL
  Filled 2011-03-18 (×3): qty 3

## 2011-03-18 NOTE — Tx Team (Signed)
Interdisciplinary Treatment Plan Update (Adult)  Date:  03/18/2011  Time Reviewed:  10:15AM-11:00AM  Progress in Treatment: Attending groups:  Yes Participating in groups:    No, mostly is a Psychologist, clinical, believes other patients are "crazy" Taking medication as prescribed:    Yes Tolerating medication:   Yes Family/Significant other contact made:  Yes Patient understands diagnosis:   No, is in complete denial Discussing patient identified problems/goals with staff:   Yes, although her only identified goal is to be discharged Medical problems stabilized or resolved:   N/A Denies suicidal/homicidal ideation:  Yes Issues/concerns per patient self-inventory:   Wants to be discharged Other:  New problem(s) identified: Yes, Describe:  States that all the things said to have her involuntariliy committed this time and previous times were always by mother, who has "mental issues".  Wants to be discharged immediately.  During treatment team, however, mother dropped off a letter from her lawyer which can be seen in doctor's note, demanding that patient not be discharged prematurely  Reason for Continuation of Hospitalization: Medication stabilization Other; describe paranoia reported  Interventions implemented related to continuation of hospitalization:  Medication monitoring and adjustment, safety checks Q15 min., suicide risk assessment, group therapy, psychoeducation, collateral contact, aftercare planning, ongoing physician assessments, medication education  Additional comments:  Not applicable  Estimated length of stay:  2-3 days  Discharge Plan:  Possibly go to [facility] under IVC; patient wants to go to [place] and live with her grandmother  New goal(s):  Not applicable  Review of initial/current patient goals per problem list:   1.  Goal(s):  Ensure that patient's paranoia is managed through medications and skills  Met:  No  Target date:  By Discharge   As evidenced by:  Patient reports  that she is not paranoid - denies all the various portions of what mother has stated to team members; paranoia is not overt at this time, but team is not yet ready to determine that goal is met.    2.  Goal(s):  Gather additional collateral information  Met:  No  Target date:  By Discharge   As evidenced by:  Patient has given consent for additional family members to be contacted to verify or deny mother's reports, as patient is in complete denial of all mother's statements re patient's behavior  3.  Goal(s):  Determine aftercare and placement at discharge.  Met:  No  Target date:  By Discharge   As evidenced by:  This is under discussion, to be determined  4.  Goal(s):  Address patient's substance abuse issues.  Met:  No  Target date:  By Discharge   As evidenced by:  Patient is in denial of this being an issue.  Attendees: Patient:  [name]  03/18/2011 10:30AM  Family:     Physician:  Dr. Harvie Heck Readling 03/18/2011 10:30AM  Nursing:   Barrie Folk, RN 03/18/2011 10:30AM    Case Manager:  Ambrose Mantle, LCSW 03/18/2011 10:30AM  Counselor:  Veto Kemps, MT-BC 03/18/2011 10:30AM  Other:      Other:      Other:      Other:       Scribe for Treatment Team:   [name], 03/18/2011, 10:12 AM

## 2011-03-18 NOTE — Progress Notes (Signed)
[facility] MD Progress Note  03/18/2011 1:05 PM  Diagnosis:  Axis I: Schizoaffective Disorder - Bipolar Type.  Cocaine Abuse.  Cannabis Abuse.   The patient was seen today and reports the following:   ADL's: Intact.  Sleep: The patient reports to sleeping well without difficulty.  Appetite: The patient reports a good appetite.   Mild>(1-10) >Severe  Hopelessness (1-10): 0  Depression (1-10): 0  Anxiety (1-10): 4   Suicidal Ideation: The patient adamantly denies any suicidal ideations today.  Plan: No  Intent: No  Means: No   Homicidal Ideation: The patient adamantly denies any homicidal ideations today.  Plan: No  Intent: No.  Means: No   General Appearance/Behavior: Casual and cooperative and somewhat hypomanic today.  Eye Contact: Good.  Speech: Appropriate in rate and volume with moderate pressuring noted.  Motor Behavior: Appropriate.  Level of Consciousness: Alert and Oriented x 3.  Mental Status: Alert and Oriented x 3.  Mood: Essentially Euthymic.  Affect: Mild to Moderately Elevated.  Anxiety Level: Mild to Moderately anxious.  Thought Process: Delusional.  Thought Content: The patient denies any auditory or visual hallucinations or delusional thinking. However, according to her mother she has been experiencing significantly paranoid at home. Perception:. Delusional.  Judgment: Poor.  Insight: Poor.  Cognition: Oriented to time, place and person.  Sleep:  Number of Hours: 5.75    Vital Signs:Blood pressure 103/73, pulse 111, temperature 96.6 F (35.9 C), temperature source Oral, resp. rate 16, height 5\' 11"  (1.803 m), weight 77.111 kg (170 lb), last menstrual period 03/15/2011. Current Medications: Current Facility-Administered Medications  Medication Dose Route Frequency Provider Last Rate Last Dose  . acetaminophen (TYLENOL) tablet 650 mg  650 mg Oral Q6H PRN Sanjuana Kava, NP      . alum & mag hydroxide-simeth (MAALOX/MYLANTA) 200-200-20 MG/5ML suspension 30 mL   30 mL Oral Q4H PRN Sanjuana Kava, NP      . divalproex (DEPAKOTE ER) 24 hr tablet 1,000 mg  1,000 mg Oral QHS Chrystal Zeimet, MD   1,000 mg at 03/17/11 2134  . haloperidol (HALDOL) tablet 5 mg  5 mg Oral Q6H PRN Franchot Gallo, MD   5 mg at 03/17/11 2037  . hydrOXYzine (ATARAX/VISTARIL) tablet 25 mg  25 mg Oral Q6H PRN Franchot Gallo, MD      . LORazepam (ATIVAN) tablet 1 mg  1 mg Oral Q6H PRN Franchot Gallo, MD   1 mg at 03/18/11 1250  . magnesium hydroxide (MILK OF MAGNESIA) suspension 30 mL  30 mL Oral Daily PRN Sanjuana Kava, NP      . risperiDONE (RISPERDAL M-TABS) disintegrating tablet 3 mg  3 mg Oral QHS Franchot Gallo, MD      . DISCONTD: hydrOXYzine (VISTARIL) capsule 25 mg  25 mg Oral TID PRN Sanjuana Kava, NP   25 mg at 03/16/11 2211  . DISCONTD: hydrOXYzine (VISTARIL) capsule 25 mg  25 mg Oral Q6H PRN Franchot Gallo, MD      . DISCONTD: risperiDONE (RISPERDAL M-TABS) disintegrating tablet 2 mg  2 mg Oral QHS Franchot Gallo, MD   2 mg at 03/17/11 2134   Lab Results:  Results for orders placed during the hospital encounter of 03/16/11 (from the past 48 hour(s))  URINALYSIS, ROUTINE W REFLEX MICROSCOPIC     Status: Abnormal   Collection Time   03/17/11  2:35 PM      Component Value Range Comment   Color, Urine YELLOW  YELLOW     APPearance  CLOUDY (*) CLEAR     Specific Gravity, Urine 1.030  1.005 - 1.030     pH 5.5  5.0 - 8.0     Glucose, UA NEGATIVE  NEGATIVE (mg/dL)    Hgb urine dipstick NEGATIVE  NEGATIVE     Bilirubin Urine NEGATIVE  NEGATIVE     Ketones, ur NEGATIVE  NEGATIVE (mg/dL)    Protein, ur NEGATIVE  NEGATIVE (mg/dL)    Urobilinogen, UA 0.2  0.0 - 1.0 (mg/dL)    Nitrite NEGATIVE  NEGATIVE     Leukocytes, UA NEGATIVE  NEGATIVE  MICROSCOPIC NOT DONE ON URINES WITH NEGATIVE PROTEIN, BLOOD, LEUKOCYTES, NITRITE, OR GLUCOSE <1000 mg/dL.  TSH     Status: Normal   Collection Time   03/17/11  8:07 PM      Component Value Range Comment   TSH 1.910  0.350 - 4.500 (uIU/mL)     T3, FREE     Status: Normal   Collection Time   03/17/11  8:07 PM      Component Value Range Comment   T3, Free 3.8  2.3 - 4.2 (pg/mL)   T4, FREE     Status: Normal   Collection Time   03/17/11  8:07 PM      Component Value Range Comment   Free T4 1.28  0.80 - 1.80 (ng/dL)    Time was spent today discussing with the patient her current symptoms. The patient was disappointed that she would not be discharged home today.  According to the patient's Mother, she cannot come home.  Later today the treatment team received a hand delivered letter from the patient's Mother.  It was found to be a letter from the patient's Mother's Attorney which is scanned below.         Treatment Plan Summary:  1. Daily contact with patient to assess and evaluate symptoms and progress in treatment  2. Medication management  3. The patient will deny suicidal ideations or homicidal ideations for 48 hours prior to discharge and have a depression and anxiety rating of 3 or less. The patient will also deny any auditory or visual hallucinations or delusional thinking.  4. The patient will deny any symptoms of substance withdrawal at time of discharge.   Plan:  1. Will continue the patient's medications as ordered.  2. Will increase the medication Risperdal M-tabs to 3 mgs po qhs for psychosis and further mood stabilization.  3. Will obtain a serum Depakote Level this evening to assure that her Depakote level is therapeutic. 4. The above letter was given to [facility] to address. 5. The patient will remain under involuntary commitment until further stabilized. 6. Will continue to monitor.   Joanette Silveria 03/18/2011, 1:05 PM

## 2011-03-18 NOTE — Progress Notes (Signed)
Pt approached Clinical research associate asking for HS meds. Appears very bright and elated. Is quite ambivalent and child-like in conversation. Continues to be hypomanic and has been observed doing exercises up and down the hallway. Has had to be redirected to violating boundaries with staff (attempting to hug as an example), but does redirect easily. Continues to have poor insight into her mental illness and denies any current psychotic symptoms. Denies pain. Denies SI/HI and contracts for safety. States she feels like she is ready for DC and is hopeful to be discharged by tomorrow. Did not have any questions or concerns. Support and encouragement provided. POC and medications for the shift reviewed and understanding verbalized. Safety has been maintained with Q19minute observation. Will continue current POC.

## 2011-03-18 NOTE — Discharge Planning (Addendum)
Met with patient in Aftercare Planning Group.   She reported that she does not want to go back to live with her mother, but rather wants to travel to [place] and live with her grandmother.  Per previous conversation by counselor with mother, neither of these situations is actually possible at this time.  Patient is in denial of her drug or alcohol use being a problem, and does not feel a need to go to [facility], when this was mentioned by Case Manager.    Adamantly demanding discharge today.  Patient was brought into treatment team, was very tearful when told news that was uncomfortable to her, that she disagreed with.  She did agree we could speak with other family members and gave telephone numbers to counselor.  Team at one point directed to do [facility] referral.  Case Manager to confirm this, then do as needed.  Ambrose Mantle, LCSW 03/18/2011, 1:38 PM  [facility] referral done, sent to [facility] for authorization, sent to [facility].  Ambrose Mantle, LCSW 03/18/2011, 3:57 PM

## 2011-03-18 NOTE — Progress Notes (Signed)
03/18/2011         Time: 0930      Group Topic/Focus: The focus of this group is on discussing various styles of communication and communicating assertively using 'I' (feeling) statements.  Participation Level: Active  Participation Quality: Appropriate  Affect: Excited  Cognitive: Alert  Additional Comments: Patient very bright, reports she is looking forward to discharge today.   Yusif Gnau 03/18/2011 12:51 PM

## 2011-03-18 NOTE — Progress Notes (Signed)
Patient ID: [name], female   DOB: 1990-10-11, 20 y.o.   MRN: [id number] Patient presents as hypomanic, requires redirection form staff, but does so easily. Appears anxious. Denies SI or HI.

## 2011-03-18 NOTE — Progress Notes (Signed)
Patient ID: [name], female   DOB: 1990-04-25, 20 y.o.   MRN: [id number] Patient tangential, anxious, minimizing treatment issues. Denies SI/HI Rates depression and hopeless scale as 1/10 for both (10 high). Patient presents as hypomanic. Easily redirected this shift.

## 2011-03-19 LAB — VALPROIC ACID LEVEL: Valproic Acid Lvl: 39.3 ug/mL — ABNORMAL LOW (ref 50.0–100.0)

## 2011-03-19 MED ORDER — DIVALPROEX SODIUM ER 500 MG PO TB24
1500.0000 mg | ORAL_TABLET | Freq: Every day | ORAL | Status: DC
  Administered 2011-03-19 – 2011-03-30 (×12): 1500 mg via ORAL
  Filled 2011-03-19 (×5): qty 3
  Filled 2011-03-19: qty 1
  Filled 2011-03-19 (×7): qty 3
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
## 2011-03-19 NOTE — Progress Notes (Signed)
Adult Services Patient-Family Contact/Session  Attendees:  Patient's father, [name] ([phone])  Goal(s):  To determine if patient can live with father  Safety Concerns:    Narrative:  Counselor reported to father that patient had stated that she could live with him after discharge. When asked to confirm this, father stated that this was not true. What he had told her was that she could come down there for her birthday. He stated that mother was working on her treatment and he would support mother with whatever she arranged.  Barrier(s):  Patient's denial and lack of insight  Interventions:  Discussed discharge planning  Recommendation(s): Outpatient treatment   Follow-up Required:  No  Explanation:    [name], [name] 03/19/2011, 1:10 PM

## 2011-03-19 NOTE — Progress Notes (Signed)
Patient ID: [name], female   DOB: [birth year], 20 y.o.   MRN: [id number]   Patient a little guarded on approach but then better after speaking to her. Currently denies any SI/HI. Reports that she feels mood has improved since admitted here. No agitation or psychosis noted. Denies any a/v hallucinations. Staff will monitor and encourage group attendance.

## 2011-03-19 NOTE — Progress Notes (Signed)
Adult Services Patient-Family Contact/Session  Attendees:  Patient's mother, Ashyah Quizon ([phone])  Goal(s):  Return call, update on patient's treatment  Safety Concerns:  Mother continues to be concerned for her safety.  Narrative:  Answered mother's questions and told her that we had received the letter from her lawyer and that counselor had transferred message from [facility] case manager to our case manager. Case manager will be making call to discuss. Discussed with mother counselor's surprise with the lawyer's letter since counselor and mother had a lengthy talk the day before. She stated that the lawyer's letter had been prepared before she spoke with counselor. She felt good about being heard by counselor and only left that in the letter because she had talked to someone that hadn't been so receptive. Counselor explained that who she identified as talking to was the secretary for the unit and would not have been able to take such information.  Told mother that patient was being referred to [facility] and would be committed to this program. Also discussed plans to send her directly from here to facility. Explained that it was a 14 day program and after this they would determine how much longer she would be there. Her initial reaction was that this would not be long enough, but counselor explained that this was a good starting place. Told her that treatment team had told patient that because she was involuntarily  committed that she could not determine her treatment. Informed mother that patient has a court Radio producer that would visit with her and her hearing would be on 2/28. If patient elects to go to court, she may go and the court would decide whether she stayed in the hospital or not. Informed her that the hospital would not have a say so at this point. Mother was informed that she can go to court and counselor will call her of this date, in case it is delayed.   Called mother back to tell  her that transportation would be provided by the sheriff due to her involuntary status to [facility]. [facility] most likely will be available next week. Also informed mother that patient wanted to talk with her. Mother stated that patient has to give code.  Talked with patient about talk with her father where he said she could not live there. Also told her about talk with mother. Patient continued to insist that she be out for her birthday and then go to treatment. Told her that she will be sent directly from the hospital to [facility]. She was tearful.  Barrier(s):  Lack of insight  Interventions:  Information, support  Recommendation(s):  Continued inpatient treatment until there is a bed at [facility]. Patient would be a flight risk if she was discharged and then was expected to go to treatment. Based on her history of non-compliance she would probably not go to treatment as promised.  Follow-up Required:  Yes  Explanation:  Notify mother of court date  Veto Kemps 03/19/2011, 1:16 PM
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
## 2011-03-19 NOTE — Progress Notes (Signed)
Patient ID: [name], female   DOB: March 03, 1990, 21 y.o.   MRN: [id number] [name] was seen in team meeting today.  She denies suicidal thoughts, homicidal thoughts, depressed mood and hopelessness.  She reports her anxiety is 3-4/10 on a 1-10 scale with 10 being the worst symptoms.   She has spoken with her father and he has told her she can come and stay with him until [facility] program is ready to take her.  She is hoping to be out of here in order to celebrate her 21st birthday on Feb 27th, but says she will not go clubbing. Says she wants to go to a nice restaurant and have a  Glass of wine. She is thinking about having her mother come to dinner so she can reassure her that she would never hurt her - feels bad that her mother believes her to be dangerous.  She is tearful discussing this.   Says she is willing to go to the [facility] program. She is apologetic to the team after becoming explosive this morning during discharge planning group when CM reminded her that we were planning on seeking placement in a substance abuse treatment program.  She received prn Haldol and prn Ativan after that incident and has been quiet since.   She also reports her Mom thinks she needs to have a CT scan of her brain and she has never had any imaging studies.    Plan:  Counselor will speak with her father with Jeremy present. Will get CTscan of Brain.  Dr. Allena Katz agrees with plan.

## 2011-03-19 NOTE — Progress Notes (Signed)
[facility] Group Notes:  (Counselor/Nursing/MHT/Case Management/Adjunct)  03/19/2011 6:09 PM  Type of Therapy:  Group Therapy  Participation Level:  Active  Participation Quality:  Attentive and Sharing  Affect:  Irritable  Cognitive:  Oriented  Insight:  None  Engagement in Group:  Good  Engagement in Therapy:  Good  Modes of Intervention:  Clarification, Education and Support  Summary of Progress/Problems: Patient continues to stated that she is ready for discharge. Not accepting that family will not take her home.   HartisAram Beecham 03/19/2011, 6:09 PM
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
## 2011-03-19 NOTE — Tx Team (Signed)
Interdisciplinary Treatment Plan Update (Adult)  Date:  03/19/2011  Time Reviewed:  10:15AM-11:00AM  Progress in Treatment: Attending groups:  Yes Participating in groups:    Yes, although labile and demanding often, also has had several verbal outbursts and rushed out of room Taking medication as prescribed:    Yes Tolerating medication:   Yes Family/Significant other contact made:  Yes, with mother, grandmother, and father Patient understands diagnosis:   No, in total denial, no insight, poor judgment Discussing patient identified problems/goals with staff:   Yes, although her only goal is to leave the hospital for her birthday prior to going to treatment, which she does not feel is necessary Medical problems stabilized or resolved:   Yes Denies suicidal/homicidal ideation:  Yes Issues/concerns per patient self-inventory:   Wants discharge, wants to see her attorney to get out of hospital Other:  New problem(s) identified: No, Describe:    Reason for Continuation of Hospitalization: Delusions  Medication stabilization Other; describe hypomania, reports of paranoia, need for rehab  Interventions implemented related to continuation of hospitalization:  Medication monitoring and adjustment, safety checks Q15 min., suicide risk assessment, group therapy, psychoeducation, collateral contact, aftercare planning, ongoing physician assessments, medication education  Additional comments:  Not applicable  Estimated length of stay:  4-6 days  Discharge Plan:  Involuntarily commit to [facility]  New goal(s):  Not applicable  Review of initial/current patient goals per problem list:   1.  Goal(s):  Ensure that parent's paranoia is managed through medication and skills.  Met:  No  Target date:  By Discharge   As evidenced by:   Patient still not admitting to the paranoia which has been a major factor in her IVC to this facility; however, she is on medication.  She is not yet learning coping  skills, as she is mostly focused on leaving facility, does not feel she needs help, and is quite labile. 2 2.  Goal(s):  Gather additional collateral information.  Met:  Yes  Target date:  By Discharge   As evidenced by:  Have spoken with mother, grandmother and father.  Cannot at this point go to live with any of them  3.  Goal(s):  Determine aftercare and placement at discharge.  Met:  No  Target date:  By Discharge   As evidenced by:  Still working on this, patient states today she is willing to go to [facility] for treatment  4.  Goal(s):  Address patient's substance abuse issues.  Met:  No  Target date:  By Discharge   As evidenced by:  Patient is starting to be receptive to treatment, based on being told that she would be allowed to smoke cigarettes there; referral has been made to [facility].  Patient is trying to insist on discharge to spend her birthday with her Father prior to going to treatment.  Father is not in agreement.  Attendees: Patient:  [name]  03/19/2011 10:30AM  Family:     Physician:  Dr. Harvie Heck Readling 03/19/2011 10:30AM  Nursing:   Izola Price, RN 03/19/2011 10:30AM    Case Manager:  Ambrose Mantle, LCSW 03/19/2011 10:30AM  Counselor:  Veto Kemps, MT-BC 03/19/2011 10:30AM  Other:   Lynann Bologna, NP 03/19/2011 10:30AM  Other:      Other:      Other:       Scribe for Treatment Team:   [name], 03/19/2011, 11:07 AM

## 2011-03-19 NOTE — Progress Notes (Signed)
Patient was very upset this morning, was yelling and demanding to have some "God damned Ativan."  She was given Ativan and Haldol which was effective.  She also received a hydroxyzine 25 mg later in the shift.  She has been cooperative since her outburst this morning and did come back and apologize to the team for her behavior.  The rest of the day she has interacted well with staff and peers.  She has stayed in her room more today as the milieu is somewhat unsettled at this time.  She has been pleasant and cooperative the rest of the day.

## 2011-03-20 MED ORDER — LORAZEPAM 1 MG PO TABS
1.0000 mg | ORAL_TABLET | Freq: Four times a day (QID) | ORAL | Status: DC | PRN
  Administered 2011-03-20 – 2011-03-27 (×10): 1 mg via ORAL
  Filled 2011-03-20 (×10): qty 1

## 2011-03-20 MED ORDER — RISPERIDONE 2 MG PO TBDP
4.0000 mg | ORAL_TABLET | Freq: Every day | ORAL | Status: DC
  Administered 2011-03-20 – 2011-03-26 (×7): 4 mg via ORAL
  Filled 2011-03-20 (×8): qty 2

## 2011-03-20 NOTE — Discharge Planning (Signed)
Did not see patient this day, as she told MHTs that if we want to send her to treatment, she will just not come to groups any longer.  Did do utilization review for additional days, and did follow up on referral to [facility], finding it has not yet been reviewed.  No other case management needs today.  Ambrose Mantle, LCSW 03/20/2011, 3:10 PM

## 2011-03-20 NOTE — Progress Notes (Signed)
[facility] MD Progress Note  03/20/2011 12:58 PM  Diagnosis:   Axis I: Schizoaffective Disorder - Bipolar Type.  Cocaine Abuse.  Cannabis Abuse.   The patient was seen today and reports the following:   ADL's: Intact.  Sleep: The patient reports to sleeping well without difficulty.  Appetite: The patient reports a good appetite.   Mild>(1-10) >Severe  Hopelessness (1-10): 0  Depression (1-10): 0  Anxiety (1-10): 2   Suicidal Ideation: The patient adamantly denies any suicidal ideations today.  Plan: No  Intent: No  Means: No   Homicidal Ideation: The patient adamantly denies any homicidal ideations today.  Plan: No  Intent: No.  Means: No   General Appearance/Behavior: Casual and cooperative and somewhat hypomanic today.  Eye Contact: Good.  Speech: Appropriate in rate and volume with mild pressuring noted.  Motor Behavior: Appropriate.  Level of Consciousness: Alert and Oriented x 3.  Mental Status: Alert and Oriented x 3.  Mood: Essentially Euthymic.  Affect: Mildly Elevated.  Anxiety Level: Mildly anxious.  Thought Process: wnl.  Thought Content: The patient denies any auditory or visual hallucinations or delusional thinking. Perception:. wnl.  Judgment: Poor.  Insight: Poor.  Cognition: Oriented to time, place and person.  Sleep:  Number of Hours: 6.75    Vital Signs:Blood pressure 122/93, pulse 91, temperature 97 F (36.1 C), temperature source Oral, resp. rate 20, height 5\' 11"  (1.803 m), weight 77.111 kg (170 lb), last menstrual period 03/15/2011.  Current Medications: Current Facility-Administered Medications  Medication Dose Route Frequency Provider Last Rate Last Dose  . acetaminophen (TYLENOL) tablet 650 mg  650 mg Oral Q6H PRN Sanjuana Kava, NP      . alum & mag hydroxide-simeth (MAALOX/MYLANTA) 200-200-20 MG/5ML suspension 30 mL  30 mL Oral Q4H PRN Sanjuana Kava, NP      . divalproex (DEPAKOTE ER) 24 hr tablet 1,500 mg  1,500 mg Oral QHS Jaxsyn Catalfamo, MD    1,500 mg at 03/19/11 2221  . haloperidol (HALDOL) tablet 5 mg  5 mg Oral Q6H PRN Franchot Gallo, MD   5 mg at 03/19/11 0843  . hydrOXYzine (ATARAX/VISTARIL) tablet 25 mg  25 mg Oral Q6H PRN Franchot Gallo, MD   25 mg at 03/19/11 2223  . LORazepam (ATIVAN) tablet 1 mg  1 mg Oral Q6H PRN Franchot Gallo, MD      . magnesium hydroxide (MILK OF MAGNESIA) suspension 30 mL  30 mL Oral Daily PRN Sanjuana Kava, NP      . risperiDONE (RISPERDAL M-TABS) disintegrating tablet 3 mg  3 mg Oral QHS Franchot Gallo, MD   3 mg at 03/19/11 2221  . DISCONTD: divalproex (DEPAKOTE ER) 24 hr tablet 1,000 mg  1,000 mg Oral QHS Franchot Gallo, MD   1,000 mg at 03/18/11 2130  . DISCONTD: LORazepam (ATIVAN) tablet 1 mg  1 mg Oral Q6H PRN Franchot Gallo, MD   1 mg at 03/20/11 1252   Lab Results:  Results for orders placed during the hospital encounter of 03/16/11 (from the past 48 hour(s))  VALPROIC ACID LEVEL     Status: Abnormal   Collection Time   03/18/11  7:54 PM      Component Value Range Comment   Valproic Acid Lvl 39.3 (*) 50.0 - 100.0 (ug/mL)    Time was spent today discussing with the patient her current symptoms. The patient reports that she continues to feel she is ready for discharge.  She continues to become easily agitated with explosive  anger outbursts when she does not get her way.  The patient continues to be willing to go to an inpatient substance abuse treatment center as soon as a bed is available.  Treatment Plan Summary:  1. Daily contact with patient to assess and evaluate symptoms and progress in treatment  2. Medication management to address psychiatric symptoms. 3. The patient will deny suicidal ideations or homicidal ideations for 48 hours prior to discharge and have a depression and anxiety rating of 3 or less. The patient will also deny any auditory or visual hallucinations or delusional thinking.  4. The patient will deny any symptoms of substance withdrawal at time of discharge.   Plan:   1. Will continue the patient's medications as ordered.  2. Will increase the medication Risperdal M-tabs to 4 mgs po qhs to provide further mood stabilization.  3. The patient will remain under involuntary commitment until further stabilized.  4. Will continue to monitor.   Shanikqua Zarzycki 03/20/2011, 12:58 PM

## 2011-03-20 NOTE — Progress Notes (Signed)
[facility] Group Notes:  (Counselor/Nursing/MHT/Case Management/Adjunct)  03/20/2011 10:14 AM  Type of Therapy:  Group Therapy  Participation Level:  Did Not Attend  [name] 03/20/2011, 10:14 AM
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
## 2011-03-20 NOTE — Progress Notes (Addendum)
Patient ID: [name], female   DOB: Oct 11, 1990, 20 y.o.   MRN: [id number] Pt is asleep in bed this AM refusing to attend groups. Pt denies SI/HI and AVH. Pt mood is depressed and affect is appropriate to circumstance. Pt plan is attend [facility] from [facility]. Tx team is awaiting insurance approval. Pt mother is expected to visit today. Writer will continue to monitor. Pt mother visited at lunch, and they had a good visit. Pt mood is labile afterwards. Writer counseled Pt about behavior and pt is responsive to input.

## 2011-03-21 NOTE — Progress Notes (Signed)
Patient ID: [name], female   DOB: 1990/07/06, 20 y.o.   MRN: [id number]  Pt. attended and participated in aftercare planning group. Pt. accepted information on suicide prevention, warning signs to look for with suicide and crisis line numbers to use. The pt. agreed to call crisis line numbers if having warning signs or having thoughts of suicide.   Pt shared that she is planning to D/C on Monday and will be transported to [facility] by her mother. She has not further case management needs at this time.

## 2011-03-21 NOTE — Progress Notes (Signed)
[facility] MD Progress Note  03/21/2011 4:16 PM  Diagnosis:   Schizoaffective Disorder Bipolar Type                      Cocaine abuse                      Cannabis abuse  ADL's:  Intact  Sleep: Good  Appetite:  Good  Suicidal Ideation:  denies Homicidal Ideation:  denies  AEB (as evidenced by):Patient's statements. [name] is in bed during the visit today, and is sleepy and drowsy.  She does respond to questions and makes good eye contact.  Mental Status Examination/Evaluation: Objective:  Appearance: Casual  Eye Contact::  Good  Speech:  Clear and Coherent  Volume:  Normal  Mood:  Euthymic  Affect:  Appropriate  Thought Process:  Coherent  Orientation:  Full  "states my 21st birthday is in 4 days."  Thought Content:  WDL  Suicidal Thoughts:  No  Homicidal Thoughts:  No  Memory:  Immediate;   Fair  Judgement:  Other:  undetermined today  Insight:  undetermined today.  Psychomotor Activity:  Normal  Concentration:  Fair  Recall:  Fair  Akathisia:  No  Handed:    AIMS (if indicated):     Assets:  Social Support  Sleep:  Number of Hours: 5.75    Vital Signs:Blood pressure 144/89, pulse 114, temperature 96.7 F (35.9 C), temperature source Oral, resp. rate 16, height 5\' 11"  (1.803 m), weight 77.111 kg (170 lb), last menstrual period 03/15/2011. Current Medications: Current Facility-Administered Medications  Medication Dose Route Frequency Provider Last Rate Last Dose  . acetaminophen (TYLENOL) tablet 650 mg  650 mg Oral Q6H PRN Sanjuana Kava, NP      . alum & mag hydroxide-simeth (MAALOX/MYLANTA) 200-200-20 MG/5ML suspension 30 mL  30 mL Oral Q4H PRN Sanjuana Kava, NP      . divalproex (DEPAKOTE ER) 24 hr tablet 1,500 mg  1,500 mg Oral QHS Randy Readling, MD   1,500 mg at 03/20/11 2159  . haloperidol (HALDOL) tablet 5 mg  5 mg Oral Q6H PRN Franchot Gallo, MD   5 mg at 03/19/11 0843  . hydrOXYzine (ATARAX/VISTARIL) tablet 25 mg  25 mg Oral Q6H PRN Franchot Gallo, MD   25 mg  at 03/19/11 2223  . LORazepam (ATIVAN) tablet 1 mg  1 mg Oral Q6H PRN Franchot Gallo, MD   1 mg at 03/20/11 2159  . magnesium hydroxide (MILK OF MAGNESIA) suspension 30 mL  30 mL Oral Daily PRN Sanjuana Kava, NP      . risperiDONE (RISPERDAL M-TABS) disintegrating tablet 4 mg  4 mg Oral QHS Franchot Gallo, MD   4 mg at 03/20/11 2159    Lab Results: No results found for this or any previous visit (from the past 48 hour(s)). Chart is reviewed and nurses consulted and no concerns are noted at this time today.  [name] did ask about going home, and she is informed that there is no plan to discharge her over the weekend.  She did not question this, but turned over and went back to sleep.  Treatment Plan Summary:  Daily contact with patient to assess and evaluate symptoms and progress in treatment Medication management  Plan: No changes at this time.  Current plan of care to be continued and will follow up as planned.  [name] 03/21/2011, 4:16 PM

## 2011-03-21 NOTE — Progress Notes (Signed)
Patient ID: [name], female   DOB: [birth year], 20 y.o.   MRN: [id number]   [facility] Group Notes:  (Counselor/Nursing/MHT/Case Management/Adjunct)  03/21/2011 11 AM  Type of Therapy:  Group Therapy, Dance/Movement Therapy   Participation Level:  Active  Participation Quality:  Attentive  Affect:  Excited  Cognitive:  Oriented  Insight:  Limited  Engagement in Group:  Good  Engagement in Therapy:  Good  Modes of Intervention:  Clarification, Problem-solving, Role-play, Socialization and Support  Summary of Progress/Problems:   Group practiced technique of how to become mentally present when moving the body. Pt stated that she is going to focus on the positive as a form of self-care today. Pt was engaged in the group process, but she would periodically giggle to herself or to a peer. This indicates that she is able to focus but has difficulty taking things seriously.   Thomasena Edis, [employer]

## 2011-03-21 NOTE — Progress Notes (Signed)
Pt has been isolative in her room all day  She has attended groups and participated  She has been calm and cooperative  She appears to respond to internal stimuli but denies voices   Verbal support given  Medications administered and effectiveness monitored  Q 15 min checks  Pt safe at present

## 2011-03-21 NOTE — Progress Notes (Signed)
122

## 2011-03-21 NOTE — Progress Notes (Signed)
PT RESTING IN BED WITH EYES CLOSED. RESPIRATIONS EVEN AND NON-LABORED. NO DISTRESS NOTED. Q15 MIN SAFETY CHECKS. SAFETY MAINTAINED. 

## 2011-03-21 NOTE — Progress Notes (Signed)
Patient ID+ [name], female   DOB: 1991-01-20, 20 y.o.   MRN: [id number] The patient is labile and had several episodes of angry outburst. She had initially refused her medications, but after given some time to regain control, came willingly to the medication window for her HS medications. Denies any A/V hallucinations. Her thoughts are very loose and disorganized.

## 2011-03-22 DIAGNOSIS — F3110 Bipolar disorder, current episode manic without psychotic features, unspecified: Secondary | ICD-10-CM
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
## 2011-03-22 NOTE — Progress Notes (Signed)
Patient ID: [name], female   DOB: 1991-01-01, 20 y.o.   MRN: [id number] The patient is somewhat labile, but in better control this evening. Is not able to express any insight as to her hospitalization or illness. Feels she is ready for discharge and plans on leaving Monday and return to her home. Discharge plan in place for her is to go to [facility]  after discharge from [facility].

## 2011-03-22 NOTE — Progress Notes (Signed)
Pt is isolative to her room and only interacts with select others  She attends and participates in some groups  She has been calm and cooperative  She can be intrusive at times but redirects easily   Verbal support given  Medications administered and effectiveness monitored  Q 15 min checks  Pt safe at present

## 2011-03-22 NOTE — Progress Notes (Signed)
Patient ID: [name], female   DOB: 04/19/1990, 20 y.o.   MRN: [id number] The patient is still somewhat labile. Her judgement is poor and has limited insight.  Needed to be redirected several times to maintain appropriate boundaries in the milieu.

## 2011-03-22 NOTE — Progress Notes (Signed)
Patient ID: [name], female   DOB: 06/17/1990, 20 y.o.   MRN: [id number]   [facility] Group Notes:  (Counselor/Nursing/MHT/Case Management/Adjunct)  03/22/2011 11 AM  Type of Therapy:  Group Therapy, Dance/Movement Therapy   Participation Level:  Did Not Attend   Kym Groom

## 2011-03-23 MED ORDER — NICOTINE POLACRILEX 2 MG MT GUM
2.0000 mg | CHEWING_GUM | OROMUCOSAL | Status: DC | PRN
  Administered 2011-03-26 – 2011-03-30 (×7): 2 mg via ORAL
  Filled 2011-03-23 (×3): qty 1

## 2011-03-23 NOTE — Progress Notes (Signed)
[facility] Group Notes:  (Counselor/Nursing/MHT/Case Management/Adjunct)  03/23/2011 2:13 PM  Type of Therapy:  Group Therapy  Participation Level:  Did Not Attend   [name] 03/23/2011, 2:13 PM

## 2011-03-23 NOTE — Tx Team (Signed)
Interdisciplinary Treatment Plan Update (Adult)  Date:  03/23/2011  Time Reviewed:  10:15AM-11:00AM  Progress in Treatment: Attending groups:  Yes Participating in groups: Poor participation, not on topic    Taking medication as prescribed:    Yes Tolerating medication:   Yes Family/Significant other contact made:  Yes Patient understands diagnosis:   No, no insight, poor impulse control, poor judgment Discussing patient identified problems/goals with staff:  Yes, totally focused on wanting discharge  Medical problems stabilized or resolved:  N/A  Denies suicidal/homicidal ideation:  Yes Issues/concerns per patient self-inventory:   Wants discharge Other:    New problem(s) identified: No, Describe:    Reason for Continuation of Hospitalization: Medication stabilization Other; describe placement, paranoia reported  Interventions implemented related to continuation of hospitalization:  Medication monitoring and adjustment, safety checks Q15 min., suicide risk assessment, group therapy, psychoeducation, collateral contact, aftercare planning, ongoing physician assessments, medication education  Additional comments:  Very poor impulse control, easily angered,   Estimated length of stay:  2-3 days  Discharge Plan:  Go to [facility]  New goal(s):  Not applicable  Review of initial/current patient goals per problem list:   1.  Goal(s):  Ensure that patient's paranoia is managed through medication and skills.  Met:  Yes  Target date:  By Discharge   As evidenced by:  Still have not seen evidence of paranoia  2.  Goal(s):  Determine aftercare and placement at discharge.  Met:  Yes  Target date:  By Discharge   As evidenced by:  [facility]  3.  Goal(s):  Address patient's substance abuse issues.  Met:  No  Target date:  By Discharge   As evidenced by:  [facility] placement, but need bed there before patient can be discharged      Attendees: Patient:     Family:     Physician:      Nursing:   Robbie Louis, RN 03/23/2011 10:15AM -11:00AM   Case Manager:  Ambrose Mantle, LCSW 03/23/2011 10:15AM-11:00AM  Counselor:  Veto Kemps, MT-BC 03/23/2011 10:15AM-11:00AM  Other:   Lynann Bologna, NP 03/23/2011 10:15AM-11:00AM  Other:      Other:      Other:       Scribe for Treatment Team:   [name], 03/23/2011, 10:15AM-11:15AM

## 2011-03-23 NOTE — Progress Notes (Signed)
In bed, resting quietly with eyes closed on approach. Opened eyes spontaneously to name. Appears flat at the moment. Calm and cooperative with assessment. No acute distress noted. States she has had an ok day but feels really tired (got prn medication at 1800 per report). States she is hopeful for DC tomorrow and feels like she is ready. However, she could not identify a meaningful DCP and doesn't know where she is going. States we are supposed to be sending her somewhere but she hasn't heard when. Support and encouragement provided. Offered no questions or concerns. Denies SI/HI/AVH and contracts for safety. POC and medications for the shift reviewed and understanding verbalized. Safety has been maintained with Q15 minute observation. Will continue current POC.

## 2011-03-23 NOTE — Progress Notes (Signed)
03/23/2011         Time: 0930      Group Topic/Focus: The focus of this group is on enhancing the patient's understanding of leisure, barriers to leisure, and the importance of engaging in positive leisure activities upon discharge for improved total health.  Participation Level: Did Not Attend  Participation Quality: Not Applicable  Affect: Not Applicable  Cognitive: Not Applicable  Additional Comments: Patient reported she couldn't come to group because she had to start calling attorneys.      Kairon Shock 03/23/2011 1:49 PM

## 2011-03-23 NOTE — Discharge Planning (Signed)
Met with patient in Aftercare Planning Group.   She had a difficult time awaiting her turn to speak, but could be seen making effort to do so.  When it was in fact her turn, she immediately demanded discharge, and talked again about how "5 words, I said 5 words and now I've been in the hospital 8 days".  She threatened lawsuits against everyone involved in her hospitalization, and said her father wants her to come stay with him, so she has a place to go.  When CM stated that it was understood that father had said she could come there for the day of her birthday, she became angry and got up and around the room, then left loudly.  Throughout the day, patient was pleasant and persistent in asking about status of [facility] referral.  Case Manager called multiple times, at least 8 times, to find out this status, but the person in charge of this is unavailable today apparently, did not return phone calls.  Ambrose Mantle, LCSW 03/23/2011, 3:29 PM

## 2011-03-23 NOTE — Progress Notes (Addendum)
Patient ID: [name], female   DOB: Jan 11, 1991, 21 y.o.   MRN: [id number] Nixon is fully alert and angry this morning that there is no plan to send her home today.  Our counselor has spoken to her father (see note) who has deferred all decisions to patient's mother and made no offer to care for her in his home.  We are still awaiting admission acceptance from the [facility] program.    Jewels is cursing at me this morning and insists we have no right to control her discharge plan. She wants to go to the shelter and I have instructed her that the plan at this point is that she will stay here until she goes to [facility]. She is cursing and yelling at me.  Denies suicidal and homicidal thoughts. She had wanted to be discharged to celebrate her birthday Wednesday by going out to dinner and having '1-2'glasses of wine.   O:  Mood and affect are irritable.  Insight negligible.  Speech is pressured with multiple expletives. Minimizes substance use. She is taking all medications as prescribed. Was ordered a CT Scan of brain and I am not clear on why this has not been performed.  Explosive at this time due to our plan to send her to the [facility] program without sending her out into the community first.  A. Schizoaffective Bipolar Type. Poly SA.  Plan: Continue current plan.  Will discontinue CT Scan order at this time, which was not fulfilled, given her current behavior, and will re-evaluate in am.

## 2011-03-23 NOTE — Progress Notes (Signed)
Pt is resting in bed with eyes closed. RR WNL, even and unlabored. Level III obs remain in place. Pt is safe. [name]

## 2011-03-23 NOTE — Progress Notes (Signed)
Pt has been up and has been visible in milieu today, pt has been intrusive and irritable during the various activities, has stated that she needs to leave today and spoke about either going to live with her father or she could go to a homeless shelter but said she needed "up out of this bitch". Pt has stated that there is nothing wrong with her and that she has been here too long, pt did respond to re-direction, support provided, will continue to monitor

## 2011-03-24 ENCOUNTER — Ambulatory Visit (HOSPITAL_COMMUNITY)
Admit: 2011-03-24 | Discharge: 2011-03-24 | Disposition: A | Discharge status: Home / Self Care | Payer: BC Managed Care – PPO | Source: Ambulatory Visit | Attending: Psychiatry | Admitting: Psychiatry

## 2011-03-24 DIAGNOSIS — R4182 Altered mental status, unspecified: Secondary | ICD-10-CM | POA: Insufficient documentation

## 2011-03-24 LAB — DIFFERENTIAL
Lymphs Abs: 1.9 10*3/uL (ref 0.7–4.0)
Monocytes Absolute: 0.9 10*3/uL (ref 0.1–1.0)
Monocytes Relative: 10 % (ref 3–12)
Neutro Abs: 5.9 10*3/uL (ref 1.7–7.7)
Neutrophils Relative %: 67 % (ref 43–77)

## 2011-03-24 LAB — COMPREHENSIVE METABOLIC PANEL
ALT: 10 U/L (ref 0–35)
AST: 11 U/L (ref 0–37)
CO2: 28 mEq/L (ref 19–32)
Calcium: 9.2 mg/dL (ref 8.4–10.5)
Creatinine, Ser: 0.77 mg/dL (ref 0.50–1.10)
GFR calc Af Amer: >90 mL/min (ref >90)
GFR calc non Af Amer: >90 mL/min (ref >90)
Sodium: 138 mEq/L (ref 135–145)
Total Protein: 6.7 g/dL (ref 6.0–8.3)

## 2011-03-24 LAB — CBC
HCT: 38.7 % (ref 36.0–46.0)
Hemoglobin: 13.3 g/dL (ref 12.0–15.0)
MCH: 31.3 pg (ref 26.0–34.0)
MCHC: 34.4 g/dL (ref 30.0–36.0)
RBC: 4.25 MIL/uL (ref 3.87–5.11)

## 2011-03-24 MED ORDER — IOHEXOL 300 MG/ML  SOLN: 100.0000 mL | Freq: Once | INTRAMUSCULAR | Status: AC | PRN

## 2011-03-24 MED ORDER — IOHEXOL 300 MG/ML  SOLN
100.0000 mL | Freq: Once | INTRAMUSCULAR | Status: AC | PRN
  Administered 2011-03-24: 100 mL via INTRAVENOUS

## 2011-03-24 MED ORDER — HALOPERIDOL 1 MG PO TABS: 1.0000 mg | ORAL_TABLET | Freq: Once | ORAL | Status: DC

## 2011-03-24 NOTE — Discharge Planning (Signed)
Met with patient in Aftercare Planning Group and in Treatment Team.  She left group this morning very angry and cursing because Case Manager would not tell her it was okay for her to go to a shelter today.  However, she quickly re-presented with a smile on her face.  Case Manager finally got someone at [facility] on phone, and they reported that patient is not yet accepted onto wait list, said they need information about her "delusions and paranoia."  Case Manager sent last few days' notes.  In the meantime, they also relayed that female beds are currently full and it could be 3 weeks prior to an opening.  Case Manager contacted Granville Lewis, care manager at [facility] to ask for ideas of other places patient could possibly be sent due to length of wait for [facility] bed.  Did utilization review for additional days.  During Aftercare Planning Group, Case Manager provided psychoeducation on "Suicide Prevention Information."  This included descriptions of risk factors for suicide, warning signs that an individual is in crisis and thinking of suicide, and what to do if this occurs.  Pt indicated understanding of information provided, and will read brochure given upon discharge.     Ambrose Mantle, LCSW 03/24/2011, 2:28 PM

## 2011-03-24 NOTE — Tx Team (Signed)
Interdisciplinary Treatment Plan Update (Adult)  Date:  03/24/2011  Time Reviewed:  10:15AM-11:00AM  Progress in Treatment: Attending groups:  Yes Participating in groups:    Only focused on leaving hospital Taking medication as prescribed:    Yes Tolerating medication:   Yes Family/Significant other contact made:  Yes Patient understands diagnosis:   No Discussing patient identified problems/goals with staff:   Yes, but only focused on discharge Medical problems stabilized or resolved:   N/A Denies suicidal/homicidal ideation:  Yes Issues/concerns per patient self-inventory:   Wants immediate discharge, threatening Other:    New problem(s) identified: No, Describe:    Reason for Continuation of Hospitalization: Other; describe Placement ([facility])  Interventions implemented related to continuation of hospitalization:  Medication monitoring and adjustment, safety checks Q15 min., suicide risk assessment, group therapy, psychoeducation, collateral contact, aftercare planning, ongoing physician assessments, medication education  Additional comments:  Not applicable  Estimated length of stay:  1-2 days  Discharge Plan:  [facility] or homeless shelter  New goal(s):  Not applicable  Review of initial/current patient goals per problem list:   1.  Goal(s):  Address patient's substance abuse issues.  Met:  No  Target date:  By Discharge   As evidenced by:  Patient refutes all efforts to discuss her drug & alcohol issues  2.  Goal(s):  Determine aftercare and placement at discharge.  Met:  No  Target date:  By Discharge   As evidenced by:  Still needs rehab, but [facility] states will not have bed for 3 weeks so seeking placement elsewhere     Attendees: Patient:  [name]  03/24/2011 10:15AM-11:00AM  Family:     Physician:  Dr. Harvie Heck Readling 03/24/2011 10:15AM-11:00AM  Nursing:   Robbie Louis, RN 03/24/2011 10:15AM -11:00AM   Case Manager:  Ambrose Mantle, LCSW 03/24/2011  10:15AM-11:00AM  Counselor:  Veto Kemps, MT-BC 03/24/2011 10:15AM-11:00AM  Other:   Lynann Bologna, NP 03/24/2011 10:15AM-11:00AM  Other:   Izola Price, NP 03/24/2011 10:15AM-11:00AM  Other:      Other:       Scribe for Treatment Team:   [name], 03/24/2011, 10:15AM-11:15AM
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
## 2011-03-24 NOTE — Plan of Care (Signed)
Patient very frustrated over not being able to leave today.  Insists that she can go to various relatives or friends homes.  Also states she would be fine going to a homeless shelter.  Patient came to treatment team today.  Very whiney and demanding.  Not listening or processing what was being said to her.  She called her Godmother after treatment team and was told she would not be able to stay with her.  Staying in her room more today, but interacting well with peers when she is out of her room.  Requested Ativan once for complaints of anxiety.
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
## 2011-03-24 NOTE — Progress Notes (Signed)
[facility] MD Progress Note  03/24/2011 1:48 PM  Diagnosis:  Axis I: Schizoaffective Disorder - Bipolar Type.  Cocaine Abuse.  Cannabis Abuse.   The patient was seen today and reports the following:   ADL's: Intact.  Sleep: The patient reports to continuing to sleep well without difficulty.  Appetite: The patient reports a good appetite.   Mild>(1-10) >Severe  Hopelessness (1-10): 0  Depression (1-10): 0  Anxiety (1-10): 0   Suicidal Ideation: The patient adamantly denies any suicidal ideations today.  Plan: No  Intent: No  Means: No   Homicidal Ideation: The patient adamantly denies any homicidal ideations today.  Plan: No  Intent: No.  Means: No   General Appearance/Behavior: Casual and cooperative but remains somewhat hypomanic today.  Eye Contact: Good.  Speech: Appropriate in rate and volume with mild pressuring noted.  Motor Behavior: Appropriate.  Level of Consciousness: Alert and Oriented x 3.  Mental Status: Alert and Oriented x 3.  Mood: Essentially Euthymic.  Affect: Mildly Elevated.  Anxiety Level: No Anxiety reported or noted.  Thought Process: wnl.  Thought Content: The patient denies any auditory or visual hallucinations or delusional thinking.  Perception:. wnl.  Judgment: Poor.  Insight: Poor.  Cognition: Oriented to time, place and person.  Sleep:  Number of Hours: 6.75    Vital Signs:Blood pressure 103/70, pulse 118, temperature 97.6 F (36.4 C), temperature source Oral, resp. rate 20, height 5\' 11"  (1.803 m), weight 77.111 kg (170 lb), last menstrual period 03/15/2011.  Current Medications: Current Facility-Administered Medications  Medication Dose Route Frequency Provider Last Rate Last Dose  . acetaminophen (TYLENOL) tablet 650 mg  650 mg Oral Q6H PRN Sanjuana Kava, NP   650 mg at 03/23/11 1304  . alum & mag hydroxide-simeth (MAALOX/MYLANTA) 200-200-20 MG/5ML suspension 30 mL  30 mL Oral Q4H PRN Sanjuana Kava, NP      . divalproex (DEPAKOTE ER) 24  hr tablet 1,500 mg  1,500 mg Oral QHS Jasmynn Pfalzgraf, MD   1,500 mg at 03/23/11 2133  . haloperidol (HALDOL) tablet 5 mg  5 mg Oral Q6H PRN Franchot Gallo, MD   5 mg at 03/23/11 1559  . hydrOXYzine (ATARAX/VISTARIL) tablet 25 mg  25 mg Oral Q6H PRN Franchot Gallo, MD   25 mg at 03/23/11 1304  . LORazepam (ATIVAN) tablet 1 mg  1 mg Oral Q6H PRN Franchot Gallo, MD   1 mg at 03/24/11 1137  . magnesium hydroxide (MILK OF MAGNESIA) suspension 30 mL  30 mL Oral Daily PRN Sanjuana Kava, NP      . nicotine polacrilex (NICORETTE) gum 2 mg  2 mg Oral PRN Franchot Gallo, MD      . risperiDONE (RISPERDAL M-TABS) disintegrating tablet 4 mg  4 mg Oral QHS Franchot Gallo, MD   4 mg at 03/23/11 2133   Lab Results: No results found for this or any previous visit (from the past 48 hour(s)).  Time was spent today discussing with the patient her current symptoms. The patient reports that she continues to feel she is ready for discharge. It was explained to the patient that appropriate placement will be necessary before discharge can be ordered.  The patient is also being referred to [facility] for further treatment of her substance abuse issues.  Treatment Plan Summary:  1. Daily contact with patient to assess and evaluate symptoms and progress in treatment  2. Medication management to address psychiatric symptoms.  3. The patient will deny suicidal ideations or homicidal  ideations for 48 hours prior to discharge and have a depression and anxiety rating of 3 or less. The patient will also deny any auditory or visual hallucinations or delusional thinking.  4. The patient will deny any symptoms of substance withdrawal at time of discharge.   Plan:  1. Will continue the patient's medications as ordered.  2. Will order a CMP, CBC with Diff and serum Depakote Level in the morning. 3. The patient will remain under involuntary commitment until further stabilized.  4. Will continue to monitor.  5. Case Manager will continue to  work on placement. 6. A CT of the Head with and without contrasted will be reordered today to rule out CNS pathology.  Marcella Charlson 03/24/2011, 1:48 PM

## 2011-03-25 NOTE — Progress Notes (Signed)
Patient ID: [name], female   DOB: 03/01/1990, 21 y.o.   MRN: [id number] Pt. Is tearful, reports "I'm just sad, I want to go home". Pt. Rates depression at 2 out of 10. Writer and pt. Discussed that according to Dr. Ramond Craver not pt. May be discharged if she agrees to maintain behavior for 48 hrs. Pt. Excited that she maybe discharged on Friday. Staff will continue to monitor q87min for safety.

## 2011-03-25 NOTE — Progress Notes (Signed)
Pt stayed in bed until nearly noon today.  She remains labile and with very poor judgement.  She has to be told the same information over and over.  She did talk with the public defender today at lunch and wants to go to court tomorrow.  As the day passed, she started to say she didn't want to go.  She stated,"I am afraid the doctor is against me so I don't want to go now".  She had asked to speak with the doctor again today.  He said that if he got time to he would but if not he would see her in the morning.  She was satisfied by that response then.  At 1658, she request a prn ativan.  She stated, I am getting anxious that the doctor is against me"  Reminded her that the doctor had her best interest in mind and he would talk with her in the morning.  She voiced understanding.  She went to dinner and came back and was much calmer.

## 2011-03-25 NOTE — Progress Notes (Signed)
[facility] MD Progress Note  03/25/2011 2:15 PM  Diagnosis:  Axis I: Schizoaffective Disorder - Bipolar Type.  Cocaine Abuse.  Cannabis Abuse.   The patient was seen today and reports the following:   ADL's: Intact.  Sleep: The patient reports to continuing to sleep well without difficulty.  Appetite: The patient reports a good appetite.   Mild>(1-10) >Severe  Hopelessness (1-10): 0  Depression (1-10): 0  Anxiety (1-10): 0   Suicidal Ideation: The patient adamantly denies any suicidal ideations today.  Plan: No  Intent: No  Means: No   Homicidal Ideation: The patient adamantly denies any homicidal ideations today.  Plan: No  Intent: No.  Means: No   General Appearance/Behavior: Casual and cooperative today.  Eye Contact: Good.  Speech: Appropriate in rate and volume with no pressuring noted today.  Motor Behavior: Appropriate.  Level of Consciousness: Alert and Oriented x 3.  Mental Status: Alert and Oriented x 3.  Mood: Essentially Euthymic.  Affect: Bright and Full.  Anxiety Level: No Anxiety reported or noted.  Thought Process: wnl.  Thought Content: The patient denies any auditory or visual hallucinations or delusional thinking.  Perception:. wnl.  Judgment: Poor.  Insight: Poor.  Cognition: Oriented to time, place and person.  Sleep:  Number of Hours: 6.75    Vital Signs:Blood pressure 97/63, pulse 77, temperature 97.8 F (36.6 C), temperature source Oral, resp. rate 17, height 5\' 11"  (1.803 m), weight 77.111 kg (170 lb), last menstrual period 03/15/2011.  Current Medications: Current Facility-Administered Medications  Medication Dose Route Frequency Provider Last Rate Last Dose  . acetaminophen (TYLENOL) tablet 650 mg  650 mg Oral Q6H PRN Sanjuana Kava, NP   650 mg at 03/23/11 1304  . alum & mag hydroxide-simeth (MAALOX/MYLANTA) 200-200-20 MG/5ML suspension 30 mL  30 mL Oral Q4H PRN Sanjuana Kava, NP      . divalproex (DEPAKOTE ER) 24 hr tablet 1,500 mg  1,500 mg  Oral QHS Javarian Jakubiak, MD   1,500 mg at 03/24/11 2153  . haloperidol (HALDOL) tablet 5 mg  5 mg Oral Q6H PRN Franchot Gallo, MD   5 mg at 03/24/11 1733  . hydrOXYzine (ATARAX/VISTARIL) tablet 25 mg  25 mg Oral Q6H PRN Franchot Gallo, MD   25 mg at 03/24/11 2155  . LORazepam (ATIVAN) tablet 1 mg  1 mg Oral Q6H PRN Franchot Gallo, MD   1 mg at 03/24/11 1733  . magnesium hydroxide (MILK OF MAGNESIA) suspension 30 mL  30 mL Oral Daily PRN Sanjuana Kava, NP      . nicotine polacrilex (NICORETTE) gum 2 mg  2 mg Oral PRN Franchot Gallo, MD      . risperiDONE (RISPERDAL M-TABS) disintegrating tablet 4 mg  4 mg Oral QHS Franchot Gallo, MD   4 mg at 03/24/11 2153  . DISCONTD: haloperidol (HALDOL) tablet 1 mg  1 mg Oral Once Franchot Gallo, MD       Facility-Administered Medications Ordered in Other Encounters  Medication Dose Route Frequency Provider Last Rate Last Dose  . iohexol (OMNIPAQUE) 300 MG/ML solution 100 mL  100 mL Intravenous Once PRN Medication Radiologist, MD   100 mL at 03/24/11 1639  . iohexol (OMNIPAQUE) 300 MG/ML solution 100 mL  100 mL Intravenous Once PRN Medication Radiologist, MD       Lab Results:  Results for orders placed during the hospital encounter of 03/16/11 (from the past 48 hour(s))  COMPREHENSIVE METABOLIC PANEL     Status: Abnormal  Collection Time   03/24/11  7:57 PM      Component Value Range Comment   Sodium 138  135 - 145 (mEq/L)    Potassium 3.9  3.5 - 5.1 (mEq/L)    Chloride 103  96 - 112 (mEq/L)    CO2 28  19 - 32 (mEq/L)    Glucose, Bld 107 (*) 70 - 99 (mg/dL)    BUN 14  6 - 23 (mg/dL)    Creatinine, Ser 4.09  0.50 - 1.10 (mg/dL)    Calcium 9.2  8.4 - 10.5 (mg/dL)    Total Protein 6.7  6.0 - 8.3 (g/dL)    Albumin 3.5  3.5 - 5.2 (g/dL)    AST 11  0 - 37 (U/L)    ALT 10  0 - 35 (U/L)    Alkaline Phosphatase 49  39 - 117 (U/L)    Total Bilirubin 0.1 (*) 0.3 - 1.2 (mg/dL)    GFR calc non Af Amer >90  >90 (mL/min)    GFR calc Af Amer >90  >90 (mL/min)     CBC     Status: Normal   Collection Time   03/24/11  7:57 PM      Component Value Range Comment   WBC 8.8  4.0 - 10.5 (K/uL)    RBC 4.25  3.87 - 5.11 (MIL/uL)    Hemoglobin 13.3  12.0 - 15.0 (g/dL)    HCT 81.1  91.4 - 78.2 (%)    MCV 91.1  78.0 - 100.0 (fL)    MCH 31.3  26.0 - 34.0 (pg)    MCHC 34.4  30.0 - 36.0 (g/dL)    RDW 95.6  21.3 - 08.6 (%)    Platelets 238  150 - 400 (K/uL)   DIFFERENTIAL     Status: Normal   Collection Time   03/24/11  7:57 PM      Component Value Range Comment   Neutrophils Relative 67  43 - 77 (%)    Neutro Abs 5.9  1.7 - 7.7 (K/uL)    Lymphocytes Relative 22  12 - 46 (%)    Lymphs Abs 1.9  0.7 - 4.0 (K/uL)    Monocytes Relative 10  3 - 12 (%)    Monocytes Absolute 0.9  0.1 - 1.0 (K/uL)    Eosinophils Relative 2  0 - 5 (%)    Eosinophils Absolute 0.1  0.0 - 0.7 (K/uL)    Basophils Relative 0  0 - 1 (%)    Basophils Absolute 0.0  0.0 - 0.1 (K/uL)   VALPROIC ACID LEVEL     Status: Normal   Collection Time   03/24/11  7:57 PM      Component Value Range Comment   Valproic Acid Lvl 86.3  50.0 - 100.0 (ug/mL)    CT HEAD WITHOUT AND WITH CONTRAST  Technique: Contiguous axial images were obtained from the base of  the skull through the vertex without and with intravenous contrast.  Contrast: 100 ml Omnipaque-300  Comparison: None.  Findings: The ventricles are normal. No extra-axial fluid  collections are seen. The brainstem and cerebellum are  unremarkable. No acute intracranial findings such as infarction or  hemorrhage. No mass lesions.Tiny fatty lesion noted near the base  of the third ventricle on the left it is likely a small lipoma. No  areas of abnormal contrast enhancement are identified. The major  vascular structures appear normal including the dural venous  sinuses.  The bony calvarium is  intact. The visualized paranasal sinuses and  mastoid air cells are clear.  IMPRESSION:  No acute intracranial findings or mass lesions.  Original  Report Authenticated By: P. Loralie Champagne, M.D.  Time was spent today discussing with the patient her current symptoms. The patient continues to reports that she feels ready for discharge. We again discussed the need to have a discharge plan where she will be discharged to a safe environment.  Currently the patient cannot provide a contact of a person who will allow her to live with them in a safe environment.  She is scheduled for court tomorrow regarding her IVC.  Treatment Plan Summary:  1. Daily contact with patient to assess and evaluate symptoms and progress in treatment  2. Medication management to address psychiatric symptoms.  3. The patient will deny suicidal ideations or homicidal ideations for 48 hours prior to discharge and have a depression and anxiety rating of 3 or less. The patient will also deny any auditory or visual hallucinations or delusional thinking.  4. The patient will deny any symptoms of substance withdrawal at time of discharge.   Plan:  1. Will continue the patient's medications as ordered.  2. The patient's CT was reviewed with no pathology noted. 3. The patient's laboratory studies were also reviewed. 4. The patient is scheduled for Court tomorrow to review her IVC.  Currently the patient would like to go to court to contest the commitment. 5. Will continue to monitor.  Noel Henandez 03/25/2011, 2:15 PM

## 2011-03-26 NOTE — Discharge Planning (Signed)
Patient currently refuses to attend groups, and relates this as a punishment for staff for keeping her in the hospital.  She thus did not attend Aftercare Planning Group, and Case Manager sought her out in her room to discuss going to court.  She stated she still planned to attend the hearing today.  Case Manager spoke with Office [place] from [place] to arrange transportation.    Later in hall, patient approached Case Manager and asked if CM believed she would be released from the hospital by going to court.  CM told her that it was not Treatment Team's belief that she would be released, because she does not seem to understand that she has an illness which is affecting her decisions.  She asked for specific examples, but scoffed at the examples given of jumping into a stranger's car and not completing any previous treatment programs.  She stated we do not have a legitimate reason for keeping her in the hospital the last 9 days.  CM gave a simple, very basic explanation about the impact of drugs and alcohol on one's brain and decision-making capabilities, but patient said that this did not apply to her and she would just tell the judge she needed to go home.  Approximately 1 hour later, MHT approached CM to relay from patient that she had changed her mind and did not want to go to court.  CM spoke with her at length and tried to share with her the positive reasons for her to go to court and represent herself, how she would feel proud afterward no matter the outcome.  Her response was, "I'm not going, I'm going to lose anyway."  When CM told her that she did not know if she would win or lose without going, she said "You're against me, I'm going to lose."  CM told her that there have been instances where the judge decided on behalf of a patient regardless of Treatment Team's position, but she adamantly declined to attend.  CM informed her that likelihood is that her IVC would be extended for  another 30 days if she did not go, as we had already provided testimony in the form of a letter to the court, and she said "I don't care, I'm not going."  A number of phone calls between the Office of Special Proceedings, [facility], patient's mother and Public Defender's Office and hospital were necessary to resolve the matter.  Ultimately, patient waived her appearance in court and we are awaiting word of outcome of hearing.  Case Manager did reach [facility] staff this morning, and patient's referral has been returned to the admitting doctor along with the additional information requested, awaiting review currently.  Ambrose Mantle, LCSW 03/26/2011, 2:42 PM

## 2011-03-26 NOTE — Progress Notes (Signed)
Writer introduced self to patient as her nurse for the shift. Patient requested a prn of ativan which was given, patient was concerned with what her Dr. Had written in his note concerning her day. Patient was asked to speak with her Dr. on tomorrow to discuss any concerns she may have. Patient currently denies having pain, -si/hi/a/v hall. Safety maintained on unit, will continue to monitor.

## 2011-03-26 NOTE — Progress Notes (Signed)
Patient was originally requesting to go to court this morning but changed her mind.  I explained to patient that if she did not go that the hearing that her commitment would likely be extended today.  She stated she didn't care any more.  She requested Ativan after lunch today stating that she was feeling really anxious.  Has been interacting some with peers today.

## 2011-03-26 NOTE — Progress Notes (Signed)
[facility] MD Progress Note  03/26/2011 9:49 AM  Diagnosis:  Axis I: Schizoaffective Disorder - Bipolar Type.  Cocaine Abuse.  Cannabis Abuse.   The patient was seen today and reports the following:   ADL's: Intact.  Sleep: The patient reports to continuing to sleep well without difficulty.  Appetite: The patient continues to report a good appetite.   Mild>(1-10) >Severe  Hopelessness (1-10): 0  Depression (1-10): 0  Anxiety (1-10): 0   Suicidal Ideation: The patient adamantly denies any suicidal ideations today.  Plan: No  Intent: No  Means: No   Homicidal Ideation: The patient adamantly denies any homicidal ideations today.  Plan: No  Intent: No.  Means: No   General Appearance/Behavior: Casual and cooperative today.  Eye Contact: Good.  Speech: Appropriate in rate and volume with no pressuring noted today.  Motor Behavior: Appropriate.  Level of Consciousness: Alert and Oriented x 3.  Mental Status: Alert and Oriented x 3.  Mood: Essentially Euthymic.  Affect: Remains Bright and Full.  Anxiety Level: No Anxiety reported or noted.  Thought Process: wnl.  Thought Content: The patient denies any auditory or visual hallucinations or delusional thinking.  Perception:. wnl.  Judgment: Poor.  Insight: Poor.  Cognition: Oriented to time, place and person.  Sleep:  Number of Hours: 6.75    Vital Signs:Blood pressure 97/63, pulse 77, temperature 97.8 F (36.6 C), temperature source Oral, resp. rate 17, height 5\' 11"  (1.803 m), weight 77.111 kg (170 lb), last menstrual period 03/15/2011.  Current Medications: Current Facility-Administered Medications  Medication Dose Route Frequency Provider Last Rate Last Dose  . acetaminophen (TYLENOL) tablet 650 mg  650 mg Oral Q6H PRN Sanjuana Kava, NP   650 mg at 03/23/11 1304  . alum & mag hydroxide-simeth (MAALOX/MYLANTA) 200-200-20 MG/5ML suspension 30 mL  30 mL Oral Q4H PRN Sanjuana Kava, NP      . divalproex (DEPAKOTE ER) 24 hr tablet  1,500 mg  1,500 mg Oral QHS Randy Readling, MD   1,500 mg at 03/25/11 2201  . haloperidol (HALDOL) tablet 5 mg  5 mg Oral Q6H PRN Franchot Gallo, MD   5 mg at 03/24/11 1733  . hydrOXYzine (ATARAX/VISTARIL) tablet 25 mg  25 mg Oral Q6H PRN Franchot Gallo, MD   25 mg at 03/24/11 2155  . LORazepam (ATIVAN) tablet 1 mg  1 mg Oral Q6H PRN Franchot Gallo, MD   1 mg at 03/25/11 1658  . magnesium hydroxide (MILK OF MAGNESIA) suspension 30 mL  30 mL Oral Daily PRN Sanjuana Kava, NP      . nicotine polacrilex (NICORETTE) gum 2 mg  2 mg Oral PRN Franchot Gallo, MD      . risperiDONE (RISPERDAL M-TABS) disintegrating tablet 4 mg  4 mg Oral QHS Franchot Gallo, MD   4 mg at 03/25/11 2202   Lab Results:  Results for orders placed during the hospital encounter of 03/16/11 (from the past 48 hour(s))  COMPREHENSIVE METABOLIC PANEL     Status: Abnormal   Collection Time   03/24/11  7:57 PM      Component Value Range Comment   Sodium 138  135 - 145 (mEq/L)    Potassium 3.9  3.5 - 5.1 (mEq/L)    Chloride 103  96 - 112 (mEq/L)    CO2 28  19 - 32 (mEq/L)    Glucose, Bld 107 (*) 70 - 99 (mg/dL)    BUN 14  6 - 23 (mg/dL)    Creatinine,  Ser 0.77  0.50 - 1.10 (mg/dL)    Calcium 9.2  8.4 - 10.5 (mg/dL)    Total Protein 6.7  6.0 - 8.3 (g/dL)    Albumin 3.5  3.5 - 5.2 (g/dL)    AST 11  0 - 37 (U/L)    ALT 10  0 - 35 (U/L)    Alkaline Phosphatase 49  39 - 117 (U/L)    Total Bilirubin 0.1 (*) 0.3 - 1.2 (mg/dL)    GFR calc non Af Amer >90  >90 (mL/min)    GFR calc Af Amer >90  >90 (mL/min)   CBC     Status: Normal   Collection Time   03/24/11  7:57 PM      Component Value Range Comment   WBC 8.8  4.0 - 10.5 (K/uL)    RBC 4.25  3.87 - 5.11 (MIL/uL)    Hemoglobin 13.3  12.0 - 15.0 (g/dL)    HCT 95.6  21.3 - 08.6 (%)    MCV 91.1  78.0 - 100.0 (fL)    MCH 31.3  26.0 - 34.0 (pg)    MCHC 34.4  30.0 - 36.0 (g/dL)    RDW 57.8  46.9 - 62.9 (%)    Platelets 238  150 - 400 (K/uL)   DIFFERENTIAL     Status: Normal    Collection Time   03/24/11  7:57 PM      Component Value Range Comment   Neutrophils Relative 67  43 - 77 (%)    Neutro Abs 5.9  1.7 - 7.7 (K/uL)    Lymphocytes Relative 22  12 - 46 (%)    Lymphs Abs 1.9  0.7 - 4.0 (K/uL)    Monocytes Relative 10  3 - 12 (%)    Monocytes Absolute 0.9  0.1 - 1.0 (K/uL)    Eosinophils Relative 2  0 - 5 (%)    Eosinophils Absolute 0.1  0.0 - 0.7 (K/uL)    Basophils Relative 0  0 - 1 (%)    Basophils Absolute 0.0  0.0 - 0.1 (K/uL)   VALPROIC ACID LEVEL     Status: Normal   Collection Time   03/24/11  7:57 PM      Component Value Range Comment   Valproic Acid Lvl 86.3  50.0 - 100.0 (ug/mL)    CT HEAD WITHOUT AND WITH CONTRAST  Technique: Contiguous axial images were obtained from the base of  the skull through the vertex without and with intravenous contrast.  Contrast: 100 ml Omnipaque-300  Comparison: None.  Findings: The ventricles are normal. No extra-axial fluid  collections are seen. The brainstem and cerebellum are  unremarkable. No acute intracranial findings such as infarction or  hemorrhage. No mass lesions.Tiny fatty lesion noted near the base  of the third ventricle on the left it is likely a small lipoma. No  areas of abnormal contrast enhancement are identified. The major  vascular structures appear normal including the dural venous  sinuses.  The bony calvarium is intact. The visualized paranasal sinuses and  mastoid air cells are clear.  IMPRESSION:  No acute intracranial findings or mass lesions.  Original Report Authenticated By: P. Loralie Champagne, M.D.   Time was spent today discussing with the patient her current symptoms.  The patient continues to feel she is ready for discharge.  Time was spent discussing with the patient the plan for court today.  It was explained that I would discuss with the judge my concerns about  her not having a safe discharge plan as well as her impulsive behaviors.  The patient continues to express a  desire for discharge and hopes the judge releases her today.  Treatment Plan Summary:  1. Daily contact with patient to assess and evaluate symptoms and progress in treatment  2. Medication management to address psychiatric symptoms.  3. The patient will deny suicidal ideations or homicidal ideations for 48 hours prior to discharge and have a depression and anxiety rating of 3 or less. The patient will also deny any auditory or visual hallucinations or delusional thinking.  4. The patient will deny any symptoms of substance withdrawal at time of discharge.   Plan:  1. Will continue the patient's medications as ordered.  2. The patient is scheduled for Court today to review her IVC. Currently the patient would like to go to court to contest the commitment.  3. Will continue to monitor.  Randy Readling 03/26/2011, 9:49 AM

## 2011-03-27 MED ORDER — CHLORDIAZEPOXIDE HCL 25 MG PO CAPS
25.0000 mg | ORAL_CAPSULE | Freq: Four times a day (QID) | ORAL | Status: DC | PRN
  Administered 2011-03-28 – 2011-03-31 (×7): 25 mg via ORAL
  Filled 2011-03-27 (×8): qty 1

## 2011-03-27 MED ORDER — RISPERIDONE 2 MG PO TBDP
2.0000 mg | ORAL_TABLET | ORAL | Status: DC
  Administered 2011-03-27 – 2011-03-31 (×8): 2 mg via ORAL
  Filled 2011-03-27 (×3): qty 1
  Filled 2011-03-27: qty 2
  Filled 2011-03-27 (×3): qty 1
  Filled 2011-03-27 (×2): qty 2
  Filled 2011-03-27 (×3): qty 1

## 2011-03-27 NOTE — Tx Team (Signed)
Interdisciplinary Treatment Plan Update (Adult)  Date:  03/27/2011  Time Reviewed:  10:15AM-11:00AM  Progress in Treatment: Attending groups:  No - It is recommended that she attend 300 hall groups Participating in groups:    No Taking medication as prescribed:    Yes Tolerating medication:   Yes Family/Significant other contact made:  Yes Patient understands diagnosis:   No Discussing patient identified problems/goals with staff:   Yes, only focused on d/c Medical problems stabilized or resolved:   Yes Denies suicidal/homicidal ideation:  Yes Issues/concerns per patient self-inventory:   None Other:    New problem(s) identified: No, Describe:    Reason for Continuation of Hospitalization: Other; describe   awaiting placement at [facility]; patient cannot be safe in the community  Interventions implemented related to continuation of hospitalization:  Medication monitoring and adjustment, safety checks Q15 min., suicide risk assessment, group therapy, psychoeducation, collateral contact, aftercare planning, ongoing physician assessments, medication education  Additional comments:  Not applicable  Estimated length of stay:  3-4 days  Discharge Plan:  Go to [facility] under IVC  New goal(s):  Not applicable  Review of initial/current patient goals per problem list:   1.  Goal(s):  Address patient's substance abuse issues.  Met:  Yes  Target date:  By Discharge   As evidenced by:  Awaiting bed availability at [facility]  2.  Goal(s):  Determine aftercare and placement at discharge.  Met:  No  Target date:  By Discharge   As evidenced by:  If [facility] bed not available, not determined where else could go      Attendees: Patient:     Family:     Physician:  Dr. Harvie Heck Readling 03/27/2011 10:15AM-11:00AM  Nursing:   Omelia Blackwater, RN 03/27/2011 10:15AM -11:00AM   Case Manager:  Ambrose Mantle, LCSW 03/27/2011 10:15AM-11:00AM  Counselor:  Veto Kemps, MT-BC 03/27/2011 10:15AM-11:00AM    Other:    y  Other:      Other:      Other:       Scribe for Treatment Team:   [name], 03/27/2011, 10:15AM-11:15AM

## 2011-03-27 NOTE — Progress Notes (Signed)
[facility] MD Progress Note  03/27/2011 2:40 PM  Diagnosis:  Axis I: Schizoaffective Disorder - Bipolar Type.  Cocaine Abuse.  Cannabis Abuse.   The patient was seen today and reports the following:   ADL's: Intact.  Sleep: The patient continues to report to sleep well without difficulty.  Appetite: The patient continues to report a good appetite.   \Mild>(1-10) >Severe  Hopelessness (1-10): 0  Depression (1-10): 0  Anxiety (1-10): 0   Suicidal Ideation: The patient adamantly denies any suicidal ideations today.  Plan: No  Intent: No  Means: No   Homicidal Ideation: The patient adamantly denies any homicidal ideations today.  Plan: No  Intent: No.  Means: No   General Appearance/Behavior: Casual and cooperative today.  Eye Contact: Good.  Speech: Appropriate in rate and volume with no pressuring noted today.  Motor Behavior: Appropriate.  Level of Consciousness: Alert and Oriented x 3.  Mental Status: Alert and Oriented x 3.  Mood: Essentially Euthymic.  Affect: Remains bright and full.  Anxiety Level: No Anxiety reported or noted.  Thought Process: wnl.  Thought Content: The patient denies any auditory or visual hallucinations or delusional thinking.  Perception:. wnl.  Judgment: Poor.  Insight: Poor.  Cognition: Oriented to time, place and person.  Sleep:  Number of Hours: 6.75    Vital Signs:Blood pressure 109/69, pulse 105, temperature 97.9 F (36.6 C), temperature source Oral, resp. rate 17, height 5\' 11"  (1.803 m), weight 77.111 kg (170 lb), last menstrual period 03/15/2011.  Current Medications: Current Facility-Administered Medications  Medication Dose Route Frequency Provider Last Rate Last Dose  . acetaminophen (TYLENOL) tablet 650 mg  650 mg Oral Q6H PRN Sanjuana Kava, NP   650 mg at 03/23/11 1304  . alum & mag hydroxide-simeth (MAALOX/MYLANTA) 200-200-20 MG/5ML suspension 30 mL  30 mL Oral Q4H PRN Sanjuana Kava, NP      . chlordiazePOXIDE (LIBRIUM) capsule 25  mg  25 mg Oral QID PRN Franchot Gallo, MD      . divalproex (DEPAKOTE ER) 24 hr tablet 1,500 mg  1,500 mg Oral QHS Lissandro Dilorenzo, MD   1,500 mg at 03/26/11 2134  . haloperidol (HALDOL) tablet 5 mg  5 mg Oral Q6H PRN Franchot Gallo, MD   5 mg at 03/24/11 1733  . hydrOXYzine (ATARAX/VISTARIL) tablet 25 mg  25 mg Oral Q6H PRN Franchot Gallo, MD   25 mg at 03/26/11 1718  . magnesium hydroxide (MILK OF MAGNESIA) suspension 30 mL  30 mL Oral Daily PRN Sanjuana Kava, NP      . nicotine polacrilex (NICORETTE) gum 2 mg  2 mg Oral PRN Franchot Gallo, MD   2 mg at 03/26/11 1250  . risperiDONE (RISPERDAL M-TABS) disintegrating tablet 2 mg  2 mg Oral BH-qamhs Shanya Ferriss, MD      . DISCONTD: LORazepam (ATIVAN) tablet 1 mg  1 mg Oral Q6H PRN Franchot Gallo, MD   1 mg at 03/27/11 1409  . DISCONTD: risperiDONE (RISPERDAL M-TABS) disintegrating tablet 4 mg  4 mg Oral QHS Franchot Gallo, MD   4 mg at 03/26/11 2134   Lab Results: No results found for this or any previous visit (from the past 48 hour(s)).  CT HEAD WITHOUT AND WITH CONTRAST  Technique: Contiguous axial images were obtained from the base of  the skull through the vertex without and with intravenous contrast.  Contrast: 100 ml Omnipaque-300  Comparison: None.  Findings: The ventricles are normal. No extra-axial fluid  collections are  seen. The brainstem and cerebellum are  unremarkable. No acute intracranial findings such as infarction or  hemorrhage. No mass lesions.Tiny fatty lesion noted near the base  of the third ventricle on the left it is likely a small lipoma. No  areas of abnormal contrast enhancement are identified. The major  vascular structures appear normal including the dural venous  sinuses.  The bony calvarium is intact. The visualized paranasal sinuses and  mastoid air cells are clear.  IMPRESSION:  No acute intracranial findings or mass lesions.  Original Report Authenticated By: P. Loralie Champagne, M.D.   Time was spent  today discussing with the patient her current symptoms. The patient continues to feel she is ready for discharge.  The patient today however stated that she does see the need to go to [facility] for longer term treatment of her substance abuse issues.    Treatment Plan Summary:  1. Daily contact with patient to assess and evaluate symptoms and progress in treatment  2. Medication management to address psychiatric symptoms.  3. The patient will deny suicidal ideations or homicidal ideations for 48 hours prior to discharge and have a depression and anxiety rating of 3 or less. The patient will also deny any auditory or visual hallucinations or delusional thinking.  4. The patient will deny any symptoms of substance withdrawal at time of discharge.   Plan:  1. At the patient's request, this provider will change the patient's Risperdal 4 mgs po qhs to 2 mgs po q am and hs. 2. Also at the patient's request, will discontinue Ativan and order Librium 25 mgs po q 6 hours - prn for anxiety. 3. The patient will remain under involuntary commitment until placement at [facility].  4. Will continue to monitor.  [name] 03/27/2011, 2:40 PM

## 2011-03-27 NOTE — Progress Notes (Addendum)
[facility] Group Notes:  (Counselor/Nursing/MHT/Case Management/Adjunct)  03/27/2011 1:24 PM  Type of Therapy:  Group Therapy 04/22/11  Participation Level:  Disruptive, minimal  Participation Quality:  Intrusive, Inattentive and Resistant  Affect:  Appropriate  Cognitive:  Oriented  Insight:  None  Engagement in Group:  Limited  Engagement in Therapy:  None  Modes of Intervention:  Education  Summary of Progress/Problems: Patient was present for the mental health association,but talked, laughed and was off task with female peer. Not serious about treatment or what he had to say. Several attempts were made to redirect and set limits on patient.   Jozef Eisenbeis, [name] 03/27/2011, 1:24 PM

## 2011-03-27 NOTE — Progress Notes (Signed)
Adult Services Patient-Family Contact/Session  Attendees: Patient's mother Consuella Lose  (Late Entry)  Goal(s):  Discuss court date  Safety Concerns: Patient trying to manipulate so that mother does not go to court.     Narrative:  Called mother several times yesterday to advise her that patient was going to court and then she wasn't and then again to confirm that everything had been called off. Gave mother support, directions, etc. She had taken off work to be present. Feeling torn about what was happening to patient. Appreciative to counselor for support. Barrier(s):  Patient's lack of insight  Interventions:  Information, support  Recommendation(s):  Continued stay  Follow-up Required:  Yes  Explanation:  Update if patient allowed  Veto Kemps 03/27/2011, 9:26 AM

## 2011-03-27 NOTE — Progress Notes (Signed)
[facility] Group Notes:  (Counselor/Nursing/MHT/Case Management/Adjunct)  03/27/2011 1:23 PM  Type of Therapy:  Group Therapy 03/24/11  Participation Level:  Did Not Attend   [name] 03/27/2011, 1:23 PM

## 2011-03-27 NOTE — Progress Notes (Signed)
[facility] Group Notes:  (Counselor/Nursing/MHT/Case Management/Adjunct)  03/27/2011 1:26 PM  Type of Therapy:  Group Therapy  Participation Level:  Minimal  Participation Quality:  Attentive and Sharing  Affect:  Appropriate  Cognitive:  Oriented  Insight:  None  Engagement in Group:  Limited  Engagement in Therapy:  Limited  Modes of Intervention:  Education, Limit-setting and Problem-solving  Summary of Progress/Problems: Patient shared with group that she was going home after court. She was confronted that this was not likely due to her impulsiveness and jumping into a car when she went to get CT scan. Patient stated "I'm a good talker, I'll get out". When she stated that she never lies, she was confronted. She left the group.   [name], [name] 03/27/2011, 1:26 PM

## 2011-03-27 NOTE — Progress Notes (Signed)
Patient ID: [name], female   DOB: Jan 30, 1990, 21 y.o.   MRN: [id number] Pt denies SI/HI/AVH.  She reports on her self inventory that she slept well, appetite good, energy level is normal, ability to pay attention is improving, no depression and hopelessness, and no physical pain.  Her goal when she is discharged is to take better care of herself & family and attend counseling.  [name] was rubbing a female patient's arm earlier in group and he moved away, since then she has been rude to this patient.  Close monitoring in place to keep these patients separate.

## 2011-03-27 NOTE — Discharge Planning (Signed)
Met with patient in Aftercare Planning Group.   She left in anger when Case Manager would not agree to rehash her insistence that she should be discharged.  Was able to contact [facility] later in the day and determine that patient can be transported there on Tuesday 3/5.  Told patient, who was excited but immediately started saying she should be discharged to a relative or friend's house to await [facility] on Tuesday, or even just go to their house for dinner.  Ambrose Mantle, LCSW 03/27/2011, 4:13 PM

## 2011-03-28 NOTE — Progress Notes (Signed)
Pt is pleasant on approach and somewhat flirtatious with males  She can be slightly intrusive and med seeking  Her thoughts are mostly logical and coherent   She is easily redirected  She attends and participates in groups   Verbal support given  medications administered and effectiveness monitored  Q 15 min checks  Pt safe at present

## 2011-03-28 NOTE — Progress Notes (Signed)
Patient ID: [name], female   DOB: [birth year], 21 y.o.   MRN: [id number] [facility] MD Progress Note  03/28/2011 8:15 PM  Diagnosis:  Axis I: Schizoaffective Disorder - Bipolar Type.  Cocaine Abuse.  Cannabis Abuse.   The patient was seen today. left groups early for no specific reason. Made multiple comment about this write in the hallway today. Could not tell why she came here and also the reasons for her IVC. Per pt she made homicidal remarks about her mother. reports poor compliance with meds. Not sure whether current meds are helping or not. Multiple stressors in her life but could not give the details. At times she changed her statements and gave conflicting stories.  She is also reports the following:   ADL's: Intact.  Sleep: The patient continues to report to sleep well without difficulty.  Appetite: The patient continues to report a good appetite.   \Mild>(1-10) >Severe  Hopelessness (1-10): 0  Depression (1-10): 0  Anxiety (1-10): 0   Suicidal Ideation: The patient adamantly denies any suicidal ideations today.  Plan: No  Intent: No  Means: No   Homicidal Ideation: The patient adamantly denies any homicidal ideations today.  Plan: No  Intent: No.  Means: No   General Appearance/Behavior: Casual and cooperative today.  Eye Contact: Good.  Speech: Appropriate in rate and volume with no pressuring noted today.  Motor Behavior: Appropriate.  Level of Consciousness: Alert and Oriented x 3.  Mental Status: Alert and Oriented x 3.  Mood: good  Affect: Labile  Anxiety Level: No Anxiety reported or noted.  Thought Process: wnl.  Thought Content: The patient denies any auditory or visual hallucinations or delusional thinking.  Perception:. wnl.  Judgment: Poor.  Insight: Poor.  Cognition: Oriented to time, place and person.  Sleep:  Number of Hours: 5.75    Vital Signs:Blood pressure 113/79, pulse 87, temperature 98.2 F (36.8 C), temperature source Oral, resp. rate  16, height 5\' 11"  (1.803 m), weight 77.111 kg (170 lb), last menstrual period 03/15/2011.  Current Medications: Current Facility-Administered Medications  Medication Dose Route Frequency Provider Last Rate Last Dose  . acetaminophen (TYLENOL) tablet 650 mg  650 mg Oral Q6H PRN Sanjuana Kava, NP   650 mg at 03/23/11 1304  . alum & mag hydroxide-simeth (MAALOX/MYLANTA) 200-200-20 MG/5ML suspension 30 mL  30 mL Oral Q4H PRN Sanjuana Kava, NP      . chlordiazePOXIDE (LIBRIUM) capsule 25 mg  25 mg Oral QID PRN Franchot Gallo, MD   25 mg at 03/28/11 1801  . divalproex (DEPAKOTE ER) 24 hr tablet 1,500 mg  1,500 mg Oral QHS Randy Readling, MD   1,500 mg at 03/27/11 2146  . haloperidol (HALDOL) tablet 5 mg  5 mg Oral Q6H PRN Franchot Gallo, MD   5 mg at 03/24/11 1733  . hydrOXYzine (ATARAX/VISTARIL) tablet 25 mg  25 mg Oral Q6H PRN Franchot Gallo, MD   25 mg at 03/28/11 1141  . magnesium hydroxide (MILK OF MAGNESIA) suspension 30 mL  30 mL Oral Daily PRN Sanjuana Kava, NP      . nicotine polacrilex (NICORETTE) gum 2 mg  2 mg Oral PRN Franchot Gallo, MD   2 mg at 03/28/11 1802  . risperiDONE (RISPERDAL M-TABS) disintegrating tablet 2 mg  2 mg Oral BH-qamhs Randy Readling, MD   2 mg at 03/28/11 0865   Lab Results: No results found for this or any previous visit (from the past 48 hour(s)).  CT  HEAD WITHOUT AND WITH CONTRAST  Technique: Contiguous axial images were obtained from the base of  the skull through the vertex without and with intravenous contrast.  Contrast: 100 ml Omnipaque-300  Comparison: None.  Findings: The ventricles are normal. No extra-axial fluid  collections are seen. The brainstem and cerebellum are  unremarkable. No acute intracranial findings such as infarction or  hemorrhage. No mass lesions.Tiny fatty lesion noted near the base  of the third ventricle on the left it is likely a small lipoma. No  areas of abnormal contrast enhancement are identified. The major  vascular  structures appear normal including the dural venous  sinuses.  The bony calvarium is intact. The visualized paranasal sinuses and  mastoid air cells are clear.  IMPRESSION:  No acute intracranial findings or mass lesions.  Original Report Authenticated By: P. Loralie Champagne, M.D.     Plan:   1. Continue current meds 2. Will continue to monitor.  [name] 03/28/2011, 8:15 PM

## 2011-03-28 NOTE — Progress Notes (Signed)
Patient ID: [name], female   DOB: 02/27/1990, 21 y.o.   MRN: [id number] The patient is pleasant, bright and social. Somewhat euphoric and intrusive, but responds to redirection. Attended evening group and actively participated, offering suggestions to others. States she is going to [facility] on Tuesday for two weeks and feels she is ready for discharge. Denied any A/V hallucinations. Denies any suicidal/homicidal ideations.

## 2011-03-28 NOTE — Progress Notes (Signed)
Patient ID: [name], female   DOB: 03/20/1990, 21 y.o.   MRN: [id number]  [facility] Group Notes:  (Counselor/Nursing/MHT/Case Management/Adjunct)  03/28/2011 11 AM  Type of Therapy:  Group Therapy, Dance/Movement Therapy   Participation Level:  Did Not Attend   Rhunette Croft

## 2011-03-29 NOTE — Progress Notes (Signed)
Patient ID: [name], female   DOB: April 16, 1990, 21 y.o.   MRN: [id number] 03-29-11 nursing shift note: pt seems very elated and bright eyed when she came to the medication window. She stated she wanted to go to her room and go back to bed. On her inventory sheet she wrote slept well, appetite good, appetite good, energy normal and attention good. He depression and hopelessness both rated at 1. No withdrawal and no thoughts of suicide or hurting herself.  No physical problems and she want have any problems staying on her medications after discharge.  She wanted to stay in bed for lunch but the rn encouraged her to go to the cafeteria. She was given a librium for anxiety at 1403 and it decreased her anxiety .  Pt has communicating with her grandmother by phone. She also did her laundry today.  rn will monitor and safety checks continue every 15 minutes.

## 2011-03-29 NOTE — Progress Notes (Signed)
Patient ID: [name], female   DOB: [birth year], 21 y.o.   MRN: [id number] [facility] MD Progress Note  03/29/2011 11:12 PM  Diagnosis:  Axis I: Schizoaffective Disorder - Bipolar Type.  Cocaine Abuse.  Cannabis Abuse.   The patient was seen today. No interested in groups today. Per pt she is sleepy today. Wants her IVC to be DC. Could not tell why she came here and also the reasons for her IVC. Per pt she made homicidal remarks about her mother. reports poor compliance with  inpast. Not sure whether current meds are helping or not.   She is also reports the following:   ADL's: Intact.  Sleep: The patient continues to report to sleep well without difficulty.  Appetite: The patient continues to report a good appetite.   \Mild>(1-10) >Severe  Hopelessness (1-10): 0  Depression (1-10): 0  Anxiety (1-10): 0   Suicidal Ideation: The patient adamantly denies any suicidal ideations today.  Plan: No  Intent: No  Means: No   Homicidal Ideation: The patient adamantly denies any homicidal ideations today.  Plan: No  Intent: No.  Means: No   General Appearance/Behavior: Casual and cooperative today.  Eye Contact: Good.  Speech: Appropriate in rate and volume with no pressuring noted today.  Motor Behavior: Appropriate.  Level of Consciousness: Alert and Oriented x 3.  Mental Status: Alert and Oriented x 3.  Mood: good  Affect: Labile  Anxiety Level: No Anxiety reported or noted.  Thought Process: wnl.  Thought Content: The patient denies any auditory or visual hallucinations or delusional thinking.  Perception:. wnl.  Judgment: Poor.  Insight: Poor.  Cognition: Oriented to time, place and person.  Sleep:  Number of Hours: 5.5    Vital Signs:Blood pressure 112/68, pulse 92, temperature 97.7 F (36.5 C), temperature source Oral, resp. rate 16, height 5\' 11"  (1.803 m), weight 77.111 kg (170 lb), last menstrual period 03/15/2011.  Current Medications: Current Facility-Administered  Medications  Medication Dose Route Frequency Provider Last Rate Last Dose  . acetaminophen (TYLENOL) tablet 650 mg  650 mg Oral Q6H PRN Sanjuana Kava, NP   650 mg at 03/23/11 1304  . alum & mag hydroxide-simeth (MAALOX/MYLANTA) 200-200-20 MG/5ML suspension 30 mL  30 mL Oral Q4H PRN Sanjuana Kava, NP      . chlordiazePOXIDE (LIBRIUM) capsule 25 mg  25 mg Oral QID PRN Franchot Gallo, MD   25 mg at 03/29/11 2202  . divalproex (DEPAKOTE ER) 24 hr tablet 1,500 mg  1,500 mg Oral QHS Randy Readling, MD   1,500 mg at 03/29/11 2202  . haloperidol (HALDOL) tablet 5 mg  5 mg Oral Q6H PRN Franchot Gallo, MD   5 mg at 03/24/11 1733  . hydrOXYzine (ATARAX/VISTARIL) tablet 25 mg  25 mg Oral Q6H PRN Franchot Gallo, MD   25 mg at 03/29/11 1657  . magnesium hydroxide (MILK OF MAGNESIA) suspension 30 mL  30 mL Oral Daily PRN Sanjuana Kava, NP      . nicotine polacrilex (NICORETTE) gum 2 mg  2 mg Oral PRN Franchot Gallo, MD   2 mg at 03/29/11 2009  . risperiDONE (RISPERDAL M-TABS) disintegrating tablet 2 mg  2 mg Oral BH-qamhs Randy Readling, MD   2 mg at 03/29/11 2202   Lab Results: No results found for this or any previous visit (from the past 48 hour(s)).  CT HEAD WITHOUT AND WITH CONTRAST  Technique: Contiguous axial images were obtained from the base of  the skull  through the vertex without and with intravenous contrast.  Contrast: 100 ml Omnipaque-300  Comparison: None.  Findings: The ventricles are normal. No extra-axial fluid  collections are seen. The brainstem and cerebellum are  unremarkable. No acute intracranial findings such as infarction or  hemorrhage. No mass lesions.Tiny fatty lesion noted near the base  of the third ventricle on the left it is likely a small lipoma. No  areas of abnormal contrast enhancement are identified. The major  vascular structures appear normal including the dural venous  sinuses.  The bony calvarium is intact. The visualized paranasal sinuses and  mastoid air cells  are clear.  IMPRESSION:  No acute intracranial findings or mass lesions.  Original Report Authenticated By: P. Loralie Champagne, M.D.     Plan:   1. Continue current meds 2. Will continue to monitor.  [name] 03/29/2011, 11:12 PM

## 2011-03-29 NOTE — Progress Notes (Signed)
Patient ID: [name], female   DOB: 29-Sep-1990, 21 y.o.   MRN: [id number] The patient is bright and social, somewhat elated. She can be flirtatious and intrusive. Needs some reminders of boundaries. Responds well to redirection. Frequently seeks medication stating she is feeling anxious, although her behavior, mood, and affect are not congruent with anxiety.  Denies any suicidal/homicidal ideation.

## 2011-03-29 NOTE — Progress Notes (Signed)
Patient ID: [name], female   DOB: [birth year], 21 y.o.   MRN: [id number]  [facility] Group Notes:  (Counselor/Nursing/MHT/Case Management/Adjunct)  03/29/2011 11 AM  Type of Therapy:  Group Therapy, Dance/Movement Therapy   Participation Level:  Did Not Attend   Rhunette Croft

## 2011-03-30 MED ORDER — NICOTINE POLACRILEX 2 MG MT GUM: 2.0000 mg | CHEWING_GUM | OROMUCOSAL | Status: AC | PRN

## 2011-03-30 MED ORDER — RISPERIDONE 2 MG PO TBDP: 2.0000 mg | ORAL_TABLET | ORAL | Status: DC

## 2011-03-30 MED ORDER — DIVALPROEX SODIUM ER 500 MG PO TB24: 1500.0000 mg | ORAL_TABLET | Freq: Every day | ORAL | Status: DC

## 2011-03-30 NOTE — Progress Notes (Signed)
[facility] Group Notes:  (Counselor/Nursing/MHT/Case Management/Adjunct)  03/30/2011 4:14 PM  Type of Therapy:  Group Therapy  Participation Level:  Active  Participation Quality:  Appropriate  Affect:  Appropriate  Cognitive:  Oriented  Insight:  Limited  Engagement in Group:  Good  Engagement in Therapy:  Good  Modes of Intervention:  Education, Problem-solving and Support  Summary of Progress/Problems: Patient attended group and shared her good news of having a bed at [facility] on Tuesday. Gave supportive feedback to peer but not insightful.   Stevin Bielinski, [name] 03/30/2011, 4:14 PM

## 2011-03-30 NOTE — Progress Notes (Signed)
03/30/2011  Time: 0930   Group Topic/Focus: The focus of this group is on discussing various styles of communication and communicating assertively using 'I' (feeling) statements.   Participation Level:  Active  Participation Quality:  Intrusive and Monopolizing  Affect:  Labile  Cognitive:  Alert  Additional Comments: Patient reports she is looking forward to discharge tomorrow. Patient continues to be loud/intrusive and laughs inappropriately often during group.  [name]  03/30/2011 1:16 PM

## 2011-03-30 NOTE — Progress Notes (Signed)
[facility] MD Progress Note  03/30/2011 4:58 PM  Diagnosis:  Axis I: Schizoaffective Disorder - Bipolar Type.  Cocaine Abuse.  Cannabis Abuse.   The patient was seen today and reports the following:   ADL's: Intact.  Sleep: The patient continues to report to sleep well without difficulty.  Appetite: The patient continues to report a good appetite.   Mild>(1-10) >Severe  Hopelessness (1-10): 0  Depression (1-10): 0  Anxiety (1-10): 0   Suicidal Ideation: The patient adamantly denies any suicidal ideations today.  Plan: No  Intent: No  Means: No   Homicidal Ideation: The patient adamantly denies any homicidal ideations today.  Plan: No  Intent: No.  Means: No   General Appearance/Behavior: Casual and cooperative today.  Eye Contact: Good.  Speech: Appropriate in rate and volume with no pressuring noted today.  Motor Behavior: Appropriate.  Level of Consciousness: Alert and Oriented x 3.  Mental Status: Alert and Oriented x 3.  Mood: Essentially Euthymic.  Affect: Remains bright and full.  Anxiety Level: No Anxiety reported or noted.  Thought Process: wnl.  Thought Content: The patient denies any auditory or visual hallucinations or delusional thinking.  Perception:. wnl.  Judgment: Poor.  Insight: Poor.  Cognition: Oriented to time, place and person.  Sleep:  Number of Hours: 5.25    Vital Signs:Blood pressure 108/72, pulse 97, temperature 97.9 F (36.6 C), temperature source Oral, resp. rate 20, height 5\' 11"  (1.803 m), weight 77.111 kg (170 lb), last menstrual period 03/15/2011.  Current Medications: Current Facility-Administered Medications  Medication Dose Route Frequency Provider Last Rate Last Dose  . acetaminophen (TYLENOL) tablet 650 mg  650 mg Oral Q6H PRN Sanjuana Kava, NP   650 mg at 03/23/11 1304  . alum & mag hydroxide-simeth (MAALOX/MYLANTA) 200-200-20 MG/5ML suspension 30 mL  30 mL Oral Q4H PRN Sanjuana Kava, NP      . chlordiazePOXIDE (LIBRIUM) capsule 25  mg  25 mg Oral QID PRN Franchot Gallo, MD   25 mg at 03/30/11 0806  . divalproex (DEPAKOTE ER) 24 hr tablet 1,500 mg  1,500 mg Oral QHS Greyson Peavy, MD   1,500 mg at 03/29/11 2202  . haloperidol (HALDOL) tablet 5 mg  5 mg Oral Q6H PRN Franchot Gallo, MD   5 mg at 03/24/11 1733  . hydrOXYzine (ATARAX/VISTARIL) tablet 25 mg  25 mg Oral Q6H PRN Franchot Gallo, MD   25 mg at 03/30/11 1348  . magnesium hydroxide (MILK OF MAGNESIA) suspension 30 mL  30 mL Oral Daily PRN Sanjuana Kava, NP      . nicotine polacrilex (NICORETTE) gum 2 mg  2 mg Oral PRN Franchot Gallo, MD   2 mg at 03/30/11 1638  . risperiDONE (RISPERDAL M-TABS) disintegrating tablet 2 mg  2 mg Oral BH-qamhs Adewale Pucillo, MD   2 mg at 03/30/11 8295   Lab Results: No results found for this or any previous visit (from the past 48 hour(s)).  CT HEAD WITHOUT AND WITH CONTRAST  Technique: Contiguous axial images were obtained from the base of  the skull through the vertex without and with intravenous contrast.  Contrast: 100 ml Omnipaque-300  Comparison: None.  Findings: The ventricles are normal. No extra-axial fluid  collections are seen. The brainstem and cerebellum are  unremarkable. No acute intracranial findings such as infarction or  hemorrhage. No mass lesions.Tiny fatty lesion noted near the base  of the third ventricle on the left it is likely a small lipoma. No  areas of abnormal contrast enhancement are identified. The major  vascular structures appear normal including the dural venous  sinuses.  The bony calvarium is intact. The visualized paranasal sinuses and  mastoid air cells are clear.  IMPRESSION:  No acute intracranial findings or mass lesions.  Original Report Authenticated By: P. Loralie Champagne, M.D.   Time was spent today discussing with the patient her current symptoms. The patient reports today that she is excited about going to [facility] in the morning and agrees with this plan.  She reports today that she  understands the need for a longer term placement for her substance abuse issues.  She hopes to be able to live with her Grandmother in [place] after completing the [facility] program.  Treatment Plan Summary:  1. Daily contact with patient to assess and evaluate symptoms and progress in treatment  2. Medication management to address psychiatric symptoms.  3. The patient will deny suicidal ideations or homicidal ideations for 48 hours prior to discharge and have a depression and anxiety rating of 3 or less. The patient will also deny any auditory or visual hallucinations or delusional thinking.  4. The patient will deny any symptoms of substance withdrawal at time of discharge.   Plan:  1. Will continue the patient on her current medications. 2. Will continue to monitor the patient. 3. The patient will remain under involuntary commitment until placement at [facility].  4. The patient will be discharged at 9 am tomorrow morning to transfer to [facility].  Schawn Byas 03/30/2011, 4:59 PM
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
## 2011-03-30 NOTE — Progress Notes (Signed)
03/30/2011 Nursing 1245 [name] is seen OOB UAL on the 400 hall today...status quo. SHe has a labile mood, crying one minute and then singing loudly and intrusively as she jaunts down the 400 hall..the  next. SHe is manipulative, as well and  Is guarded when she makes eye contact with this Clinical research associate. When this nurse attempted to speak with pt...she got angry, said ' just get away from me..I don't feel like talking to you right now". Nurse introduced herself to pt and then exited. 2 minutes later, pt approached this nurse and apologized for her behavior. R Safety is maintaiend and POC includes fostering therapeutic relationship already establisehd PD RN [facility]

## 2011-03-30 NOTE — Progress Notes (Signed)
SW met with pt individually at this time.  Pt was resting in her bed quietly.  Pt states that she is ready to go to [facility] tomorrow because she wants to leave here and go somewhere new.  SW asked what pt would be working on there and pt states alcohol and drug use.  Pt states her mood is stable.  Pt will be transported there by sheriff dept.  Transportation order was requested by SW and should be arranged for tomorrow morning.  Pt voiced no further needs at this time.   [name], LCSWA 03/30/2011  1:26 PM
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
## 2011-03-31 NOTE — Progress Notes (Signed)
Pt was discharged at 0855. Pt denied SI/HI, understands and intends to f/u at [facility].

## 2011-03-31 NOTE — Progress Notes (Signed)
Staff reported that pt was upset that she could not have a candy bar that she says her mother brought to her.  She was in the dayroom crying and cursing, saying that she knew her mother had brought two candy bars to her and she did not understand why she could not have them.  Earlier the CN told her that she could not have them because she could not have food brought from outside the facility.  Pt became very disruptive to the milieu.  This writer went to the pt and tried to reason with her.  She is being discharged tomorrow to [facility] and this Clinical research associate asked her if she could not wait until tomorrow.  She is preoccupied with getting the candy tonight.  Called the [facility] and discussed the situation with him.  In order to de-escalate the situation, the [facility] went to the pt's belongings that were brought tonight and found the candy.  This Clinical research associate and [facility] felt it was to the best interest of the pt and the milieu that the pt be given one of the candy bars tonight.  After the pt received the candy bar, she became more cooperative.  She reports no other concerns or needs.  She denies SI/HI/AV.  Will continue to monitor escalating behaviors.  Safety maintained with q15 minute checks.

## 2011-03-31 NOTE — Tx Team (Signed)
Interdisciplinary Treatment Plan Update (Adult)  Date:  03/31/2011  Time Reviewed:  10:15AM-11:00AM  Progress in Treatment: Attending groups:  Yes Participating in groups:    Yes, to limited extent Taking medication as prescribed:  Yes   Tolerating medication:   Yes Family/Significant other contact made:  Yes Patient understands diagnosis:   Yes, to limited extent (does not agree however) Discussing patient identified problems/goals with staff:   Yes Medical problems stabilized or resolved:   Yes Denies suicidal/homicidal ideation:  Yes Issues/concerns per patient self-inventory:   None Other:    New problem(s) identified: No, Describe:    Reason for Continuation of Hospitalization: None  Interventions implemented related to continuation of hospitalization:  Medication monitoring and adjustment, safety checks Q15 min., suicide risk assessment, group therapy, psychoeducation, collateral contact, aftercare planning, ongoing physician assessments, medication education - only until discharge  Additional comments:  Not applicable  Estimated length of stay:  Discharge today  Discharge Plan:  Go by Sheriff's Dept to [facility]  New goal(s):  Not applicable  Review of initial/current patient goals per problem list:   1.  Goal(s):  All other goals previously met  Met:  Yes  Target date:  By Discharge   As evidenced by:  See earlier Treatment Plan updates  2.  Goal(s):  Determine aftercare and placement at discharge  Met:  Yes  Target date:  By Discharge   As evidenced by:  Accepted at [facility] today    Attendees: Patient:  Did not attend, had already left for [facility]   Family:     Physician:  Dr. Harvie Heck Readling 03/31/2011 10:15AM-11:00AM  Nursing:   Neill Loft, RN 03/31/2011 10:15AM -11:00AM   Case Manager:  Ambrose Mantle, LCSW 03/31/2011 10:15AM-11:00AM  Counselor:  Veto Kemps, MT-BC 03/31/2011 10:15AM-11:00AM  Other:   Lynann Bologna, NP 03/31/2011 10:15AM-11:00AM  Other:       Other:      Other:       Scribe for Treatment Team:   [name], 03/31/2011, 10:15AM-11:15AM

## 2011-03-31 NOTE — Progress Notes (Signed)
Pt continues to be cooperative and appreciative that she received the candy her mother brought.  She voices no other needs at this time.

## 2011-03-31 NOTE — [facility] Suicide Risk Assessment (Signed)
Suicide Risk Assessment  Discharge Assessment     Demographic factors:  Assessment Details Time of Assessment: Admission Information Obtained From: Patient Current Mental Status:  Current Mental Status:  (Denies) Risk Reduction Factors:  Risk Reduction Factors: Sense of responsibility to family;Religious beliefs about death;Living with another person, especially a relative;Positive social support  CLINICAL FACTORS:   Alcohol/Substance Abuse/Dependencies More than one psychiatric diagnosis Previous Psychiatric Diagnoses and Treatments Schizoaffective Disorder - Bipolar Type.  COGNITIVE FEATURES THAT CONTRIBUTE TO RISK:  Thought constriction (tunnel vision)    Diagnosis:  Axis I: Schizoaffective Disorder - Bipolar Type.  Cocaine Abuse.  Cannabis Abuse.   The patient was seen today and reports the following:   ADL's: Intact.  Sleep: The patient continues to report to sleep well without difficulty.  Appetite: The patient continues to report a good appetite.   Mild>(1-10) >Severe  Hopelessness (1-10): 0  Depression (1-10): 0  Anxiety (1-10): 0   Suicidal Ideation: The patient adamantly denies any suicidal ideations today.  Plan: No  Intent: No  Means: No  Homicidal Ideation: The patient adamantly denies any homicidal ideations today.  Plan: No  Intent: No.  Means: No   General Appearance/Behavior: Casual and cooperative today.  Eye Contact: Good.  Speech: Appropriate in rate and volume with no pressuring noted today.  Motor Behavior: Appropriate.  Level of Consciousness: Alert and Oriented x 3.  Mental Status: Alert and Oriented x 3.  Mood: Essentially Euthymic.  Affect: Remains bright and full.  Anxiety Level: No Anxiety reported or noted.  Thought Process: wnl.  Thought Content: The patient denies any auditory or visual hallucinations or delusional thinking.  Perception:. wnl.  Judgment: Poor.  Insight: Poor.  Cognition: Oriented to time, place and person.    Lab Results: No results found for this or any previous visit (from the past 48 hour(s)).  CT HEAD WITHOUT AND WITH CONTRAST  Technique: Contiguous axial images were obtained from the base of  the skull through the vertex without and with intravenous contrast.  Contrast: 100 ml Omnipaque-300  Comparison: None.  Findings: The ventricles are normal. No extra-axial fluid  collections are seen. The brainstem and cerebellum are  unremarkable. No acute intracranial findings such as infarction or  hemorrhage. No mass lesions.Tiny fatty lesion noted near the base  of the third ventricle on the left it is likely a small lipoma. No  areas of abnormal contrast enhancement are identified. The major  vascular structures appear normal including the dural venous  sinuses.  The bony calvarium is intact. The visualized paranasal sinuses and  mastoid air cells are clear.   IMPRESSION:  No acute intracranial findings or mass lesions.  Original Report Authenticated By: P. Loralie Champagne, M.D.   Time was spent today discussing with the patient her current symptoms. The patient continues to report to looking forward to her transfer to [facility] today for ongoing treatment of her substance abuse issues.  She has no complaints today and feels ready for discharge.  Treatment Plan Summary:  1. Daily contact with patient to assess and evaluate symptoms and progress in treatment  2. Medication management to address psychiatric symptoms.  3. The patient will deny suicidal ideations or homicidal ideations for 48 hours prior to discharge and have a depression and anxiety rating of 3 or less. The patient will also deny any auditory or visual hallucinations or delusional thinking.  4. The patient will deny any symptoms of substance withdrawal at time of discharge.   Plan:  1. Will continue the patient on her current medications.  2. Will continue to monitor the patient.  3. The patient will be discharged at 9 am today  to transfer to [facility] for longer term treatment of her substance abuse issues.  SUICIDE RISK:   Minimal: No identifiable suicidal ideation.  Patients presenting with no risk factors but with morbid ruminations; may be classified as minimal risk based on the severity of the depressive symptoms  [name] 03/31/2011, 8:21 AM

## 2011-03-31 NOTE — Progress Notes (Signed)
[facility] Case Management Discharge Plan:  Will you be returning to the same living situation after discharge: No.  Cannot return home with mother, will go to treatment before this will be determined At discharge, do you have transportation home?:Yes,  Sheriff's Dept will provide transportation to [facility] Do you have the ability to pay for your medications:Yes,  insurance  Interagency Information:     Release of information consent forms completed and in the chart;  Patient's signature needed at discharge.  Patient to Follow up at:  Follow-up Information    Follow up with [facility] on 03/31/2011. (By noon)    Contact information:   [address] [place] Telephone:  [phone]         Patient denies SI/HI:   Yes,      Aeronautical engineer and Suicide Prevention discussed:  Yes,    Barrier to discharge identified:No.  Summary and Recommendations:  Patient will go to treatment at [facility] prior to being discharged with a plan, either to return home or go to [facility], or whatever new plan may be.     [name] 03/31/2011, 9:52 AM

## 2011-04-03 NOTE — Progress Notes (Signed)
Patient Discharge Instructions:  Psychiatric Admission Assessment Note Faxed,  03/26/2011 Discharge Summary Note Faxed,   04/03/2011 After Visit Summary (AVS) Faxed,  04/03/2011 Face Sheet Faxed, 03/26/2011 Faxed to the Next Level Care provider:  03/26/2011 and 04/03/2011  Faxed to [facility] [place] @ [phone]  Wandra Scot, 04/03/2011, 10:16 AM

## 2011-05-14 ENCOUNTER — Ambulatory Visit (HOSPITAL_COMMUNITY)
Admission: RE | Admit: 2011-05-14 | Discharge: 2011-05-14 | Disposition: A | Discharge status: Home / Self Care | Payer: BC Managed Care – PPO | Attending: Psychiatry | Admitting: Psychiatry

## 2011-05-14 DIAGNOSIS — Z79899 Other long term (current) drug therapy: Secondary | ICD-10-CM | POA: Insufficient documentation

## 2011-05-14 DIAGNOSIS — F17200 Nicotine dependence, unspecified, uncomplicated: Secondary | ICD-10-CM | POA: Insufficient documentation

## 2011-05-14 DIAGNOSIS — F1920 Other psychoactive substance dependence, uncomplicated: Secondary | ICD-10-CM | POA: Insufficient documentation

## 2011-05-14 NOTE — BH Assessment (Signed)
Assessment Note   [name] is an 21 y.o. female. PT PRESENTED TO GET ASSESSED IN ORDER TO PARTICIPATE IN THE CD IOP & GET LIFE BACK TOGETHER. PT ADMITS HAVING A LONG HX OF SUBSTANCE ABUSE SINCE AGE 13 WHICH SHE USED ALL KINDS OF DRUGS. PT SAYS LATELY SHE USES THC, ETOH(BEER & WINE) AS WELL AS POWDER COCAINE. PT SAYS SHE USES DRUGS TO COPE WITH STRESS. PT EXPRESSED THE LONGEST SHE HAS BEEN SOBER WAS 6 WEEKS & EXPRESSED SHE NO LONGER WANT TO USE DRUGS TO BE HAPPY.  PT DENIES ANY IDEATION & HALLUCINATIONS. PT HAS AGREED TO DO CD IOP  STARTING 05/15/11.  Axis I: POLYSUBSTANCE DEPENDENCE Axis II: Deferred Axis III:  Past Medical History  Diagnosis Date  . Mental health problem   . Atrial septal defect     corrected 1999   Axis IV: economic problems, occupational problems, other psychosocial or environmental problems and problems related to social environment Axis V: 51-60 moderate symptoms  Past Medical History:  Past Medical History  Diagnosis Date  . Mental health problem   . Atrial septal defect     corrected 1999    No past surgical history on file.  Family History: No family history on file.  Social History:  reports that she has been smoking Cigarettes.  She has a 5 pack-year smoking history. She has never used smokeless tobacco. She reports that she drinks about 1.2 ounces of alcohol per week. She reports that she uses illicit drugs (Marijuana and Cocaine) about 3 times per week.  Additional Social History:    Allergies: No Known Allergies  Home Medications:  Medications Prior to Admission  Medication Sig Dispense Refill  . divalproex (DEPAKOTE ER) 500 MG 24 hr tablet Take 3 tablets (1,500 mg total) by mouth at bedtime. For mood stability and impulse control.      . risperiDONE (RISPERDAL M-TABS) 2 MG disintegrating tablet Take 1 tablet (2 mg total) by mouth 2 (two) times daily in the am and at bedtime.. For delusional thoughts and mood stability.      . risperiDONE  (RISPERDAL) 0.25 MG tablet Take 1 tablet (0.25 mg total) by mouth 3 (three) times daily.  90 tablet  0  . risperiDONE (RISPERDAL) 2 MG tablet Take 1 tablet (2 mg total) by mouth at bedtime.  30 tablet  0   No current facility-administered medications on file as of 05/14/2011.    OB/GYN Status:  No LMP recorded.  General Assessment Data Location of Assessment: [facility] Living Arrangements: Parent Can pt return to current living arrangement?: Yes Admission Status: Voluntary Is patient capable of signing voluntary admission?: Yes Transfer from: Home Referral Source: Self/Family/Friend     Risk to self Suicidal Ideation: No Suicidal Intent: No Is patient at risk for suicide?: No Suicidal Plan?: No Access to Means: No What has been your use of drugs/alcohol within the last 12 months?: PT ADMITS TO A LONG HX OF SUBSTANCE ABUSE OF ALL KINDS TO HER LASTEST DRUG OF CHOICE WAS THC, ETOH & POWDER COCAINE. PT SAYS HE USES THOSE ON A DAILY BASES TO COPE WITH STRESS. PT ADMITS USING SEERAL BLUNTS  & LAST USE WAS 4 DAYS AGO; DRINKS BEER & WINE & LAST USE WAS 3 DAYS AGO; COCAINE SHE USED 1GRAM & LAST USE WAS 2/16 Previous Attempts/Gestures: No How many times?: 0  Other Self Harm Risks: NA Triggers for Past Attempts: Unpredictable Intentional Self Injurious Behavior: None Family Suicide History: No Recent  stressful life event(s): Financial Problems;Job Loss;Legal Issues;Turmoil (Comment) Persecutory voices/beliefs?: No Depression: No Depression Symptoms: Loss of interest in usual pleasures Substance abuse history and/or treatment for substance abuse?: Yes Suicide prevention information given to non-admitted patients: Not applicable  Risk to Others Homicidal Ideation: No Thoughts of Harm to Others: No Current Homicidal Intent: No Current Homicidal Plan: No Access to Homicidal Means: No Identified Victim: NA History of harm to others?: No Assessment of Violence: None  Noted Violent Behavior Description: COOPERATIVE, PLEASANT, ANXIOUS, EXCITED Does patient have access to weapons?: No Criminal Charges Pending?: No Does patient have a court date: No  Psychosis Hallucinations: None noted Delusions: None noted  Mental Status Report Appear/Hygiene: Other (Comment) (NEAT) Eye Contact: Good Motor Activity: Freedom of movement Speech: Logical/coherent Level of Consciousness: Alert;Restless Mood: Preoccupied Affect: Appropriate to circumstance Anxiety Level: Minimal Thought Processes: Coherent;Relevant Judgement: Impaired Orientation: Person;Place;Time;Situation Obsessive Compulsive Thoughts/Behaviors: None  Cognitive Functioning Concentration: Decreased Memory: Recent Intact;Remote Intact IQ: Average Insight: Poor Impulse Control: Poor Appetite: Good Weight Loss: 0  Weight Gain: 0  Sleep: No Change Total Hours of Sleep: 10  Vegetative Symptoms: None  Prior Inpatient Therapy Prior Inpatient Therapy: Yes Prior Therapy Dates: FEB 2013 Prior Therapy Facilty/Provider(s): [facility] Reason for Treatment: DETOX, STABILIZATION  Prior Outpatient Therapy Prior Outpatient Therapy: No Prior Therapy Dates: NA Prior Therapy Facilty/Provider(s): NA Reason for Treatment: NA                     Additional Information 1:1 In Past 12 Months?: No CIRT Risk: No Elopement Risk: No Does patient have medical clearance?: Yes     Disposition:  Disposition Disposition of Patient: Outpatient treatment Type of outpatient treatment: Chemical Dependence - Intensive Outpatient  On Site Evaluation by:   Reviewed with Physician:     Waldron Session 05/14/2011 11:53 AM

## 2011-08-19 ENCOUNTER — Ambulatory Visit: Payer: Self-pay | Admitting: Internal Medicine

## 2012-01-24 ENCOUNTER — Ambulatory Visit (INDEPENDENT_AMBULATORY_CARE_PROVIDER_SITE_OTHER): Payer: BC Managed Care – PPO | Admitting: Family Medicine

## 2012-01-24 VITALS — BP 118/85 | HR 96 | Temp 98.5°F | Resp 16 | Ht 70.5 in | Wt 186.2 lb

## 2012-01-24 DIAGNOSIS — R05 Cough: Secondary | ICD-10-CM

## 2012-01-24 DIAGNOSIS — M791 Myalgia, unspecified site: Secondary | ICD-10-CM

## 2012-01-24 DIAGNOSIS — IMO0001 Reserved for inherently not codable concepts without codable children: Secondary | ICD-10-CM

## 2012-01-24 DIAGNOSIS — F1410 Cocaine abuse, uncomplicated: Secondary | ICD-10-CM

## 2012-01-24 DIAGNOSIS — J111 Influenza due to unidentified influenza virus with other respiratory manifestations: Secondary | ICD-10-CM

## 2012-01-24 DIAGNOSIS — R5383 Other fatigue: Secondary | ICD-10-CM

## 2012-01-24 MED ORDER — HYDROCODONE-HOMATROPINE 5-1.5 MG/5ML PO SYRP: ORAL_SOLUTION | ORAL | Status: DC

## 2012-01-24 MED ORDER — OSELTAMIVIR PHOSPHATE 75 MG PO CAPS: 75.0000 mg | ORAL_CAPSULE | Freq: Two times a day (BID) | ORAL | Status: DC

## 2012-01-24 NOTE — Progress Notes (Signed)
Subjective:    Patient ID: [name], female    DOB: 07/12/1990, 21 y.o.   MRN: [id number]  HPI [name] is a 21 y.o. female  Started yesterday, - body ache, generalized fatigue, cough.  No known fever. No flu shot this year. Slight sore throat. Current worse sx - fatigue and body aches.  Recent UTI - treated in [facility] 12/26, - treated with Septra. may have picked up something in waiting room.  tried nyquil for cough - min relief. Hx of cocaine and marijuana in past - sober from marijuana, sober from cocaine for 1 year.  Still occasional alcoholic drink.  Denies addiction to narcotics or pain meds in past and has taken codeine cough syrup prior without difficulty.   Hx of " bronchitis in past",  Will not come to doctor after 1st of year due to insurance.   Deliver International aid/development worker.   Review of Systems  Constitutional: Negative for fever and chills.  HENT: Positive for congestion.   Respiratory: Positive for cough.   Musculoskeletal: Positive for myalgias.       Objective:   Physical Exam  Constitutional: She is oriented to person, place, and time. She appears well-developed and well-nourished. No distress.  HENT:  Head: Normocephalic and atraumatic.  Right Ear: Hearing, tympanic membrane, external ear and ear canal normal.  Left Ear: Hearing, tympanic membrane, external ear and ear canal normal.  Nose: Nose normal.  Mouth/Throat: Oropharynx is clear and moist. No oropharyngeal exudate.  Eyes: Conjunctivae normal and EOM are normal. Pupils are equal, round, and reactive to light.  Cardiovascular: Normal rate, regular rhythm, normal heart sounds and intact distal pulses.   No murmur heard. Pulmonary/Chest: Effort normal and breath sounds normal. No respiratory distress. She has no wheezes. She has no rhonchi.  Neurological: She is alert and oriented to person, place, and time.  Skin: Skin is warm and dry. No rash noted.  Psychiatric: She has a normal mood  and affect. Her behavior is normal.   Results for orders placed in visit on 01/24/12  POCT INFLUENZA A/B      Component Value Range   Influenza A, POC Positive     Influenza B, POC Negative        Assessment & Plan:  [name] is a 21 y.o. female 1. Cough  POCT Influenza A/B, HYDROcodone-homatropine (HYCODAN) 5-1.5 MG/5ML syrup  2. Myalgia  POCT Influenza A/B  3. Fatigue  POCT Influenza A/B  4. Influenza  oseltamivir (TAMIFLU) 75 MG capsule   Influenza - tamiflu x 5 days. sx care discussed with mucinex or Mucinex DM, hycodan at night only if needed - cautioned on use of this and prior addiction.  rtc precautions discussed.   Hx of cocaine abuse - commended on sobriety, but encouraged to restart NA meetings.   Patient Instructions  Take Tamiflu until gone. Take Mucinex or Mucinex DM for the congestion and cough, hycodan at bedtime if needed. Return to the clinic or go to the nearest emergency room if any of your symptoms worsen or new symptoms occur.  Influenza Facts Flu (influenza) is a contagious respiratory illness caused by the influenza viruses. It can cause mild to severe illness. While most healthy people recover from the flu without specific treatment and without complications, older people, young children, and people with certain health conditions are at higher risk for serious complications from the flu, including death. CAUSES   The flu virus is spread from person to  person by respiratory droplets from coughing and sneezing.  A person can also become infected by touching an object or surface with a virus on it and then touching their mouth, eye or nose.  Adults may be able to infect others from 1 day before symptoms occur and up to 7 days after getting sick. So it is possible to give someone the flu even before you know you are sick and continue to infect others while you are sick. SYMPTOMS   Fever (usually high).  Headache.  Tiredness (can be  extreme).  Cough.  Sore throat.  Runny or stuffy nose.  Body aches.  Diarrhea and vomiting may also occur, particularly in children.  These symptoms are referred to as "flu-like symptoms". A lot of different illnesses, including the common cold, can have similar symptoms. DIAGNOSIS   There are tests that can determine if you have the flu as long you are tested within the first 2 or 3 days of illness.  A doctor's exam and additional tests may be needed to identify if you have a disease that is a complicating the flu. RISKS AND COMPLICATIONS  Some of the complications caused by the flu include:  Bacterial pneumonia or progressive pneumonia caused by the flu virus.  Loss of body fluids (dehydration).  Worsening of chronic medical conditions, such as heart failure, asthma, or diabetes.  Sinus problems and ear infections. HOME CARE INSTRUCTIONS   Seek medical care early on.  If you are at high risk from complications of the flu, consult your health-care provider as soon as you develop flu-like symptoms. Those at high risk for complications include:  People 65 years or older.  People with chronic medical conditions, including diabetes.  Pregnant women.  Young children.  Your caregiver may recommend use of an antiviral medication to help treat the flu.  If you get the flu, get plenty of rest, drink a lot of liquids, and avoid using alcohol and tobacco.  You can take over-the-counter medications to relieve the symptoms of the flu if your caregiver approves. (Never give aspirin to children or teenagers who have flu-like symptoms, particularly fever). PREVENTION  The single best way to prevent the flu is to get a flu vaccine each fall. Other measures that can help protect against the flu are:  Antiviral Medications  A number of antiviral drugs are approved for use in preventing the flu. These are prescription medications, and a doctor should be consulted before they are  used.  Habits for Good Health  Cover your nose and mouth with a tissue when you cough or sneeze, throw the tissue away after you use it.  Wash your hands often with soap and water, especially after you cough or sneeze. If you are not near water, use an alcohol-based hand cleaner.  Avoid people who are sick.  If you get the flu, stay home from work or school. Avoid contact with other people so that you do not make them sick, too.  Try not to touch your eyes, nose, or mouth as germs ore often spread this way. IN CHILDREN, EMERGENCY WARNING SIGNS THAT NEED URGENT MEDICAL ATTENTION:  Fast breathing or trouble breathing.  Bluish skin color.  Not drinking enough fluids.  Not waking up or not interacting.  Being so irritable that the child does not want to be held.  Flu-like symptoms improve but then return with fever and worse cough.  Fever with a rash. IN ADULTS, EMERGENCY WARNING SIGNS THAT NEED URGENT MEDICAL ATTENTION:  Difficulty breathing or shortness of breath.  Pain or pressure in the chest or abdomen.  Sudden dizziness.  Confusion.  Severe or persistent vomiting. SEEK IMMEDIATE MEDICAL CARE IF:  You or someone you know is experiencing any of the symptoms above. When you arrive at the emergency center,report that you think you have the flu. You may be asked to wear a mask and/or sit in a secluded area to protect others from getting sick. MAKE SURE YOU:   Understand these instructions.  Monitor your condition.  Seek medical care if you are getting worse, or not improving. Document Released: 01/15/2003 Document Revised: 04/06/2011 Document Reviewed: 10/11/2008 [facility] Patient Information 2013 [place], [place].

## 2012-01-24 NOTE — Patient Instructions (Addendum)
Take Tamiflu until gone. Take Mucinex or Mucinex DM for the congestion and cough, hycodan at bedtime if needed. Return to the clinic or go to the nearest emergency room if any of your symptoms worsen or new symptoms occur.  Influenza Facts Flu (influenza) is a contagious respiratory illness caused by the influenza viruses. It can cause mild to severe illness. While most healthy people recover from the flu without specific treatment and without complications, older people, young children, and people with certain health conditions are at higher risk for serious complications from the flu, including death. CAUSES   The flu virus is spread from person to person by respiratory droplets from coughing and sneezing.  A person can also become infected by touching an object or surface with a virus on it and then touching their mouth, eye or nose.  Adults may be able to infect others from 1 day before symptoms occur and up to 7 days after getting sick. So it is possible to give someone the flu even before you know you are sick and continue to infect others while you are sick. SYMPTOMS   Fever (usually high).  Headache.  Tiredness (can be extreme).  Cough.  Sore throat.  Runny or stuffy nose.  Body aches.  Diarrhea and vomiting may also occur, particularly in children.  These symptoms are referred to as "flu-like symptoms". A lot of different illnesses, including the common cold, can have similar symptoms. DIAGNOSIS   There are tests that can determine if you have the flu as long you are tested within the first 2 or 3 days of illness.  A doctor's exam and additional tests may be needed to identify if you have a disease that is a complicating the flu. RISKS AND COMPLICATIONS  Some of the complications caused by the flu include:  Bacterial pneumonia or progressive pneumonia caused by the flu virus.  Loss of body fluids (dehydration).  Worsening of chronic medical conditions, such as heart  failure, asthma, or diabetes.  Sinus problems and ear infections. HOME CARE INSTRUCTIONS   Seek medical care early on.  If you are at high risk from complications of the flu, consult your health-care provider as soon as you develop flu-like symptoms. Those at high risk for complications include:  People 65 years or older.  People with chronic medical conditions, including diabetes.  Pregnant women.  Young children.  Your caregiver may recommend use of an antiviral medication to help treat the flu.  If you get the flu, get plenty of rest, drink a lot of liquids, and avoid using alcohol and tobacco.  You can take over-the-counter medications to relieve the symptoms of the flu if your caregiver approves. (Never give aspirin to children or teenagers who have flu-like symptoms, particularly fever). PREVENTION  The single best way to prevent the flu is to get a flu vaccine each fall. Other measures that can help protect against the flu are:  Antiviral Medications  A number of antiviral drugs are approved for use in preventing the flu. These are prescription medications, and a doctor should be consulted before they are used.  Habits for Good Health  Cover your nose and mouth with a tissue when you cough or sneeze, throw the tissue away after you use it.  Wash your hands often with soap and water, especially after you cough or sneeze. If you are not near water, use an alcohol-based hand cleaner.  Avoid people who are sick.  If you get the flu, stay  home from work or school. Avoid contact with other people so that you do not make them sick, too.  Try not to touch your eyes, nose, or mouth as germs ore often spread this way. IN CHILDREN, EMERGENCY WARNING SIGNS THAT NEED URGENT MEDICAL ATTENTION:  Fast breathing or trouble breathing.  Bluish skin color.  Not drinking enough fluids.  Not waking up or not interacting.  Being so irritable that the child does not want to be  held.  Flu-like symptoms improve but then return with fever and worse cough.  Fever with a rash. IN ADULTS, EMERGENCY WARNING SIGNS THAT NEED URGENT MEDICAL ATTENTION:  Difficulty breathing or shortness of breath.  Pain or pressure in the chest or abdomen.  Sudden dizziness.  Confusion.  Severe or persistent vomiting. SEEK IMMEDIATE MEDICAL CARE IF:  You or someone you know is experiencing any of the symptoms above. When you arrive at the emergency center,report that you think you have the flu. You may be asked to wear a mask and/or sit in a secluded area to protect others from getting sick. MAKE SURE YOU:   Understand these instructions.  Monitor your condition.  Seek medical care if you are getting worse, or not improving. Document Released: 01/15/2003 Document Revised: 04/06/2011 Document Reviewed: 10/11/2008 [facility] Patient Information 2013 [place], [place].

## 2012-01-26 ENCOUNTER — Encounter (HOSPITAL_COMMUNITY): Payer: Self-pay | Admitting: *Deleted

## 2012-01-26 ENCOUNTER — Emergency Department (INDEPENDENT_AMBULATORY_CARE_PROVIDER_SITE_OTHER)
Admission: EM | Admit: 2012-01-26 | Discharge: 2012-01-26 | Disposition: A | Discharge status: Home / Self Care | Payer: BC Managed Care – PPO | Source: Home / Self Care | Attending: Family Medicine | Admitting: Family Medicine

## 2012-01-26 DIAGNOSIS — J45901 Unspecified asthma with (acute) exacerbation: Secondary | ICD-10-CM

## 2012-01-26 MED ORDER — LEVOFLOXACIN 500 MG PO TABS: 500.0000 mg | ORAL_TABLET | Freq: Every day | ORAL | Status: DC

## 2012-01-26 MED ORDER — SULFAMETHOXAZOLE-TRIMETHOPRIM 800-160 MG PO TABS: 1.0000 | ORAL_TABLET | Freq: Two times a day (BID) | ORAL | Status: DC

## 2012-01-26 NOTE — ED Provider Notes (Signed)
History     CSN: [id number]  Arrival date & time 01/26/12  9629   First MD Initiated Contact with Patient 01/26/12 1920      Chief Complaint  Patient presents with  . Cough    (Consider location/radiation/quality/duration/timing/severity/associated sxs/prior treatment) Patient is a 21 y.o. female presenting with cough. The history is provided by the patient.  Cough This is a new problem. The current episode started more than 2 days ago. The problem has been gradually worsening. The cough is productive of sputum (I have bronchitis, no intention of quitting smoking.). There has been no fever. Associated symptoms include wheezing. She is a smoker. Her past medical history is significant for bronchitis.    Past Medical History  Diagnosis Date  . Mental health problem   . Atrial septal defect     corrected 1999  . Depression   . Substance abuse   . Anxiety   . Heart murmur     History reviewed. No pertinent past surgical history.  Family History  Problem Relation Age of Onset  . Cancer Mother     History  Substance Use Topics  . Smoking status: Current Every Day Smoker -- 1.0 packs/day for 5 years    Types: Cigarettes  . Smokeless tobacco: Never Used  . Alcohol Use: 1.2 oz/week    2 Glasses of wine per week     Comment: few times a week, 2 drinks per week (wine)    OB History    Grav Para Term Preterm Abortions TAB SAB Ect Mult Living                  Review of Systems  Constitutional: Negative.  Negative for fever.  Respiratory: Positive for cough and wheezing.   Cardiovascular: Negative.     Allergies  Review of patient's allergies indicates no known allergies.  Home Medications   Current Outpatient Rx  Name  Route  Sig  Dispense  Refill  . DIVALPROEX SODIUM ER 500 MG PO TB24   Oral   Take 3 tablets (1,500 mg total) by mouth at bedtime. For mood stability and impulse control.         Marland Kitchen HYDROCODONE-HOMATROPINE 5-1.5 MG/5ML PO SYRP      79m by  mouth a bedtime as needed for cough.   120 mL   0   . LEVOFLOXACIN 500 MG PO TABS   Oral   Take 1 tablet (500 mg total) by mouth daily.   7 tablet   0   . OSELTAMIVIR PHOSPHATE 75 MG PO CAPS   Oral   Take 1 capsule (75 mg total) by mouth 2 (two) times daily.   10 capsule   0   . RISPERIDONE 2 MG PO TBDP   Oral   Take 1 tablet (2 mg total) by mouth 2 (two) times daily in the am and at bedtime.. For delusional thoughts and mood stability.         Marland Kitchen RISPERIDONE 0.25 MG PO TABS   Oral   Take 1 tablet (0.25 mg total) by mouth 3 (three) times daily.   90 tablet   0   . RISPERIDONE 2 MG PO TABS   Oral   Take 1 tablet (2 mg total) by mouth at bedtime.   30 tablet   0   . SULFAMETHOXAZOLE-TMP DS 800-160 MG PO TABS   Oral   Take 1 tablet by mouth 2 (two) times daily.  BP 122/82  Pulse 78  Temp 98.6 F (37 C) (Oral)  Resp 18  SpO2 98%  LMP 01/05/2012  Physical Exam  Nursing note and vitals reviewed. Constitutional: She is oriented to person, place, and time. She appears well-developed and well-nourished.  HENT:  Right Ear: External ear normal.  Left Ear: External ear normal.  Mouth/Throat: Oropharynx is clear and moist.  Neck: Normal range of motion. Neck supple.  Cardiovascular: Normal rate, regular rhythm, normal heart sounds and intact distal pulses.   Pulmonary/Chest: Effort normal. She has wheezes. She has no rales.  Lymphadenopathy:    She has no cervical adenopathy.  Neurological: She is alert and oriented to person, place, and time.  Skin: Skin is warm and dry.    ED Course  Procedures (including critical care time)  Labs Reviewed - No data to display No results found.   1. Asthmatic bronchitis with exacerbation       MDM  Pt wouldn't allow neck exam, was offered neb treatment --"but wont I counteract that if I smoke."        Linna Hoff, MD 01/26/12 [phone]

## 2012-01-26 NOTE — ED Notes (Signed)
Pt  Reports  She  Was  Treated  3  Days  Ago  For  The  Flu      She  Is  On  tamiflu           She    Reports  She  Has  Been  Coughing  wheezey  And  Tightness  In  Her  Chest  -  She   Is  A  Smoker

## 2012-03-08 ENCOUNTER — Ambulatory Visit: Payer: BC Managed Care – PPO | Admitting: Internal Medicine

## 2012-03-08 VITALS — BP 104/68 | HR 93 | Temp 98.7°F | Resp 16 | Ht 70.5 in | Wt 183.0 lb

## 2012-03-08 DIAGNOSIS — J020 Streptococcal pharyngitis: Secondary | ICD-10-CM

## 2012-03-08 MED ORDER — HYDROCODONE-HOMATROPINE 5-1.5 MG/5ML PO SYRP: 5.0000 mL | ORAL_SOLUTION | Freq: Four times a day (QID) | ORAL | Status: DC | PRN

## 2012-03-08 MED ORDER — AMOXICILLIN 500 MG PO CAPS: 1000.0000 mg | ORAL_CAPSULE | Freq: Two times a day (BID) | ORAL | Status: AC

## 2012-03-08 NOTE — Progress Notes (Signed)
  Subjective:    Patient ID: [name], female    DOB: May 13, 1990, 22 y.o.   MRN: [id number]  Sore Throat  This is a new problem. The current episode started yesterday. The pain is worse on the right side. There has been no fever. The pain is at a severity of 10/10. The pain is severe. Associated symptoms include ear pain, swollen glands and trouble swallowing. Pertinent negatives include no headaches.   Patient comes into office today with a sore throat she woke up yesterday with a sore throat  Right side hurt and swollen more than left. She also have bumps on her back and neck HX of acne Her acne has gotten worse despite topical care.  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 Negative for fever.  HENT: Positive for ear pain and trouble swallowing.        Right side  Skin: Positive for rash.       HX of acne  Neurological: Negative for dizziness and headaches.       Objective:   Physical Exam Vital signs stable TMs clear Nares clear Throat with 3+ tonsils plus exudate and erythema 2+ a.c. nodes tender No PC/[place] nodes   RS neg    Assessment & Plan:  Acute pharyngitis  thr culture sent Started amox Followup Saturday if not greatly improved Recheck skin in one month

## 2012-03-10 LAB — CULTURE, GROUP A STREP: Organism ID, Bacteria: NORMAL

## 2012-08-10 ENCOUNTER — Encounter: Payer: Self-pay | Admitting: Internal Medicine

## 2012-08-10 ENCOUNTER — Ambulatory Visit (INDEPENDENT_AMBULATORY_CARE_PROVIDER_SITE_OTHER): Payer: BC Managed Care – PPO | Admitting: Internal Medicine

## 2012-08-10 VITALS — BP 118/60 | HR 63 | Temp 98.7°F | Resp 16 | Wt 204.6 lb

## 2012-08-10 DIAGNOSIS — N926 Irregular menstruation, unspecified: Secondary | ICD-10-CM

## 2012-08-10 DIAGNOSIS — Z0000 Encounter for general adult medical examination without abnormal findings: Secondary | ICD-10-CM

## 2012-08-10 DIAGNOSIS — M25579 Pain in unspecified ankle and joints of unspecified foot: Secondary | ICD-10-CM

## 2012-08-10 DIAGNOSIS — R5381 Other malaise: Secondary | ICD-10-CM

## 2012-08-10 DIAGNOSIS — F17200 Nicotine dependence, unspecified, uncomplicated: Secondary | ICD-10-CM

## 2012-08-10 DIAGNOSIS — M549 Dorsalgia, unspecified: Secondary | ICD-10-CM

## 2012-08-10 DIAGNOSIS — F319 Bipolar disorder, unspecified: Secondary | ICD-10-CM

## 2012-08-10 DIAGNOSIS — F250 Schizoaffective disorder, bipolar type: Secondary | ICD-10-CM

## 2012-08-10 LAB — COMPREHENSIVE METABOLIC PANEL
ALT: 11 U/L (ref 0–35)
AST: 12 U/L (ref 0–37)
CO2: 23 mEq/L (ref 19–32)
Calcium: 9.5 mg/dL (ref 8.4–10.5)
Chloride: 105 mEq/L (ref 96–112)
Potassium: 4.3 mEq/L (ref 3.5–5.3)
Sodium: 137 mEq/L (ref 135–145)
Total Protein: 6.8 g/dL (ref 6.0–8.3)

## 2012-08-10 LAB — POCT URINALYSIS DIPSTICK
Blood, UA: NEGATIVE
Ketones, UA: NEGATIVE
Leukocytes, UA: NEGATIVE
Nitrite, UA: NEGATIVE
Protein, UA: NEGATIVE
pH, UA: 6

## 2012-08-10 LAB — CBC WITH DIFFERENTIAL/PLATELET
Hemoglobin: 15.2 g/dL — ABNORMAL HIGH (ref 12.0–15.0)
Lymphocytes Relative: 35 % (ref 12–46)
Lymphs Abs: 3.2 10*3/uL (ref 0.7–4.0)
MCH: 31.5 pg (ref 26.0–34.0)
Monocytes Relative: 8 % (ref 3–12)
Neutro Abs: 4.9 10*3/uL (ref 1.7–7.7)
Neutrophils Relative %: 54 % (ref 43–77)
RBC: 4.83 MIL/uL (ref 3.87–5.11)
WBC: 9.1 10*3/uL (ref 4.0–10.5)

## 2012-08-10 LAB — POCT UA - MICROSCOPIC ONLY
Casts, Ur, LPF, POC: NEGATIVE
Crystals, Ur, HPF, POC: NEGATIVE
Sperm: POSITIVE

## 2012-08-10 MED ORDER — OXYCODONE-ACETAMINOPHEN 5-325 MG PO TABS: 1.0000 | ORAL_TABLET | Freq: Every evening | ORAL | Status: DC | PRN

## 2012-08-10 MED ORDER — CYCLOBENZAPRINE HCL 10 MG PO TABS: 10.0000 mg | ORAL_TABLET | Freq: Three times a day (TID) | ORAL | Status: DC | PRN

## 2012-08-10 MED ORDER — MELOXICAM 15 MG PO TABS: 15.0000 mg | ORAL_TABLET | Freq: Every day | ORAL | Status: DC

## 2012-08-10 NOTE — Progress Notes (Signed)
Subjective:    Patient ID: [name], female    DOB: [birth year], 22 y.o.   MRN: [id number]  HPIhere out of concern for recent fatigue, weight gain, myalgias and arthralgias. Present for 6-12 weeks. No joint swelling. Significant medical pain affecting gait. Pain in cervical thoracic and lumbar areas that gets worse with sitting or with activity. Occasionally interferes with sleep.  Off all medications for bipolar disorder and says she is stable. No longer in counseling with matt mcmillan-he dismissed her cause she was "not serious enough" Now has medications and care from Dr. Dub Mikes but is off medications at this point  Recent pregnancy #2 handed with abortion #2 after [redacted] weeks gestation. Continues with steady boyfriend described as good relationship. Only one partner in the last 12 months. No vaginal symptoms. Was restarted on contraception afterwards.  Not working. Continues smoking heavily-not willing to quit. No current illegal drug use-this probably varies week to week. Periods had been irregular leading up to abortion PaP within the last year normal  Increased acne/increased hair growth    Review of Systems No fever chills or night sweats remainder of review of systems is negative except for those things mentioned in present illness    Objective:   Physical Exam BP 118/60  Pulse 63  Temp(Src) 98.7 F (37.1 C) (Oral)  Resp 16  Wt 204 lb 9.6 oz (92.806 kg)  BMI 28.93 kg/m2  SpO2 98%  LMP 07/19/2012 HEENT clear Neck supple without thyromegaly Heart regular without murmur Lungs clear Abdomen benign  Extremities without edema/good pulses Ankles tender to palpation but no swelling and no decreased range of motion Tender over paracervical parathoracic and paralumbar muscle groups Straight leg raise to 90 intact Deep tendon  reflex is symmetrical No peripheral motor or sensory losses Cranial nerves II through XII intact Gait normal Skin with acne-no excessive hair  growth  Results for orders placed in visit on 08/10/12  POCT UA - MICROSCOPIC ONLY      Result Value Range   WBC, Ur, HPF, POC 0-1     RBC, urine, microscopic 0-3     Bacteria, U Microscopic neg     Mucus, UA neg     Epithelial cells, urine per micros 3-6     Crystals, Ur, HPF, POC neg     Casts, Ur, LPF, POC neg     Yeast, UA neg     Sperm positive    POCT URINALYSIS DIPSTICK      Result Value Range   Color, UA yellow     Clarity, UA clear     Glucose, UA neg     Bilirubin, UA neg     Ketones, UA neg     Spec Grav, UA 1.020     Blood, UA neg     pH, UA 6.0     Protein, UA neg     Urobilinogen, UA 0.2     Nitrite, UA neg     Leukocytes, UA Negative         Assessment & Plan:  Annual physical examination  Fatigue Ankle pain Cervical thoracic and lumbar pain Acne Mood disorder Irregular menses/recent pregnancy termination Nicotine addiction History of substance-abuse Overweight  Results for orders placed in visit on 08/10/12  CBC WITH DIFFERENTIAL      Result Value Range   WBC 9.1  4.0 - 10.5 K/uL   RBC 4.83  3.87 - 5.11 MIL/uL   Hemoglobin 15.2 (*) 12.0 - 15.0 g/dL  HCT 43.1  36.0 - 46.0 %   MCV 89.2  78.0 - 100.0 fL   MCH 31.5  26.0 - 34.0 pg   MCHC 35.3  30.0 - 36.0 g/dL   RDW 16.1  09.6 - 04.5 %   Platelets 317  150 - 400 K/uL   Neutrophils Relative % 54  43 - 77 %   Neutro Abs 4.9  1.7 - 7.7 K/uL   Lymphocytes Relative 35  12 - 46 %   Lymphs Abs 3.2  0.7 - 4.0 K/uL   Monocytes Relative 8  3 - 12 %   Monocytes Absolute 0.7  0.1 - 1.0 K/uL   Eosinophils Relative 3  0 - 5 %   Eosinophils Absolute 0.2  0.0 - 0.7 K/uL   Basophils Relative 0  0 - 1 %   Basophils Absolute 0.0  0.0 - 0.1 K/uL   Smear Review Criteria for review not met    SEDIMENTATION RATE      Result Value Range   Sed Rate      COMPREHENSIVE METABOLIC PANEL      Result Value Range   Sodium 137  135 - 145 mEq/L   Potassium 4.3  3.5 - 5.3 mEq/L   Chloride 105  96 - 112 mEq/L   CO2  23  19 - 32 mEq/L   Glucose, Bld 90  70 - 99 mg/dL   BUN 11  6 - 23 mg/dL   Creat 4.09  8.11 - 9.14 mg/dL   Total Bilirubin 0.3  0.3 - 1.2 mg/dL   Alkaline Phosphatase 44  39 - 117 U/L   AST 12  0 - 37 U/L   ALT 11  0 - 35 U/L   Total Protein 6.8  6.0 - 8.3 g/dL   Albumin 4.4  3.5 - 5.2 g/dL   Calcium 9.5  8.4 - 78.2 mg/dL  TSH      Result Value Range   TSH      T4, FREE      Result Value Range   Free T4    0.80 - 1.80 ng/dL  POCT UA - MICROSCOPIC ONLY      Result Value Range   WBC, Ur, HPF, POC 0-1     RBC, urine, microscopic 0-3     Bacteria, U Microscopic neg     Mucus, UA neg     Epithelial cells, urine per micros 3-6     Crystals, Ur, HPF, POC neg     Casts, Ur, LPF, POC neg     Yeast, UA neg     Sperm positive    POCT URINALYSIS DIPSTICK      Result Value Range   Color, UA yellow     Clarity, UA clear     Glucose, UA neg     Bilirubin, UA neg     Ketones, UA neg     Spec Grav, UA 1.020     Blood, UA neg     pH, UA 6.0     Protein, UA neg     Urobilinogen, UA 0.2     Nitrite, UA neg     Leukocytes, UA Negative     Remainder of labs pending includes URiprobe thyroid studies and sedimentation rate  Advised regular sleep cycle/ Adequate diet/daily exercise/stress reduction/continue with Dr. Gwyndolyn Kaufman counseling (gave names-A stewart/k fraser) Specific back exercises to include yoga//okay for meds for one month Followup 6 weeks  Meds ordered this encounter  Medications  . cyclobenzaprine (FLEXERIL) 10 MG tablet    Sig: Take 1 tablet (10 mg total) by mouth 3 (three) times daily as needed for muscle spasms.    Dispense:  30 tablet    Refill:  1  . meloxicam (MOBIC) 15 MG tablet    Sig: Take 1 tablet (15 mg total) by mouth daily.    Dispense:  30 tablet    Refill:  1  . oxyCODONE-acetaminophen (ROXICET) 5-325 MG per tablet    Sig: Take 1 tablet by mouth at bedtime as needed for pain.    Dispense:  30 tablet    Refill:  0

## 2012-08-10 NOTE — Progress Notes (Signed)
  Subjective:    Patient ID: [name], female    DOB: May 17, 1990, 22 y.o.   MRN: [id number]  HPI    Review of Systems  Constitutional: Positive for fatigue and unexpected weight change.  HENT: Positive for neck stiffness.   Eyes: Negative.   Respiratory: Negative.   Cardiovascular: Negative.   Gastrointestinal: Negative.   Endocrine: Negative.   Genitourinary: Negative.   Musculoskeletal: Positive for myalgias, back pain and arthralgias.  Skin: Negative.   Allergic/Immunologic: Negative.   Neurological: Negative.   Hematological: Negative.   Psychiatric/Behavioral: Negative.        Objective:   Physical Exam        Assessment & Plan:

## 2012-08-11 LAB — TSH: TSH: 2.124 u[IU]/mL (ref 0.350–4.500)

## 2012-08-11 LAB — SEDIMENTATION RATE: Sed Rate: 1 mm/hr (ref 0–22)

## 2012-09-06 ENCOUNTER — Other Ambulatory Visit: Payer: Self-pay

## 2012-09-06 ENCOUNTER — Telehealth: Payer: Self-pay

## 2012-09-06 NOTE — Telephone Encounter (Signed)
Pharmacist called and stated pt reported that she had come in [facility] today concerning [facility] of her Percocet and was told that pharmacist needed to call and verify that pt has been getting her Rx through them. He reported that she got it filled there on 08/10/12. I do not see any req for a RF. I am sending req to Dr Merla Riches.

## 2012-09-07 MED ORDER — OXYCODONE-ACETAMINOPHEN 5-325 MG PO TABS: 1.0000 | ORAL_TABLET | Freq: Every evening | ORAL | Status: DC | PRN

## 2012-09-07 NOTE — Telephone Encounter (Signed)
Percocet must be written Ok for another 2 week supply then needs f/u to check the exam again

## 2012-09-08 NOTE — Telephone Encounter (Signed)
Patient advised this is ready for pick up , she should follow up before this runs out.

## 2012-09-08 NOTE — Telephone Encounter (Signed)
Unable to reach voicemail not set up yet. rx at front desk

## 2012-09-21 ENCOUNTER — Ambulatory Visit: Payer: BC Managed Care – PPO | Admitting: Internal Medicine

## 2012-09-21 ENCOUNTER — Encounter: Payer: Self-pay | Admitting: Internal Medicine

## 2012-09-21 VITALS — BP 126/72 | HR 73 | Temp 98.4°F | Resp 18 | Wt 203.8 lb

## 2012-09-21 DIAGNOSIS — M549 Dorsalgia, unspecified: Secondary | ICD-10-CM

## 2012-09-21 DIAGNOSIS — R5381 Other malaise: Secondary | ICD-10-CM

## 2012-09-21 DIAGNOSIS — M25579 Pain in unspecified ankle and joints of unspecified foot: Secondary | ICD-10-CM

## 2012-09-21 DIAGNOSIS — R599 Enlarged lymph nodes, unspecified: Secondary | ICD-10-CM

## 2012-09-21 DIAGNOSIS — F17200 Nicotine dependence, unspecified, uncomplicated: Secondary | ICD-10-CM

## 2012-09-21 DIAGNOSIS — F319 Bipolar disorder, unspecified: Secondary | ICD-10-CM

## 2012-09-21 MED ORDER — OXYCODONE-ACETAMINOPHEN 5-325 MG PO TABS: 1.0000 | ORAL_TABLET | Freq: Every evening | ORAL | Status: DC | PRN

## 2012-09-21 MED ORDER — CYCLOBENZAPRINE HCL 10 MG PO TABS: 10.0000 mg | ORAL_TABLET | Freq: Every day | ORAL | Status: DC

## 2012-09-21 MED ORDER — MELOXICAM 15 MG PO TABS: 15.0000 mg | ORAL_TABLET | Freq: Every day | ORAL | Status: DC

## 2012-09-21 NOTE — Progress Notes (Signed)
  Subjective:    Patient ID: [name], female    DOB: 1990-08-14, 22 y.o.   MRN: [id number]  HPIf/u for: Fatigue  Ankle pain  Cervical thoracic and lumbar pain  Acne  Mood disorder  Irregular menses/recent pregnancy termination  Nicotine addiction  History of substance-abuse  Overweight  Lost a couple of pounds in last month. Doing "pretty good." Has not been able to do yoga, back exercises. Hair normal, skin clearing up.  Flexeril helping with pain. Wants refill of percocet. Thinks meloxicam is working, but can't tell. May help her get going in the mornings. Feels that core is strong. Smoking less. Ankle and back pain improving. Acne better  Here with boyfriend today-asking questions about erectile problems Has a new job she is excited about   Review of Systems Slowly increasing activity Fatigue better No appetite change No chest pain or palpitations No uncontrolled mood, Flights of ideas, irrational behavior, confusion    Objective:   Physical Exam BP 126/72  Pulse 73  Temp(Src) 98.4 F (36.9 C) (Oral)  Resp 18  Wt 203 lb 12.8 oz (92.443 kg)  BMI 28.82 kg/m2  SpO2 98%  LMP 09/13/2012 Pupils equal round reactive to light and accommodation Neck supple without thyromegaly Heart regular Cranial nerves II through XII intact Gait normal Facial acne improved Straight leg raise good 90 bilaterally     Assessment & Plan:  BIPOLAR DISORDER UNSPECIFIED  TOBACCO USER  FATIGUE  Back pain  Pain in joint, ankle and foot, unspecified laterality  Meds ordered this encounter  Medications  . cyclobenzaprine (FLEXERIL) 10 MG tablet    Sig: Take 1 tablet (10 mg total) by mouth at bedtime.    Dispense:  30 tablet    Refill:  3  . meloxicam (MOBIC) 15 MG tablet    Sig: Take 1 tablet (15 mg total) by mouth daily.    Dispense:  30 tablet    Refill:  3  . oxyCODONE-acetaminophen (ROXICET) 5-325 MG per tablet    Sig: Take 1 tablet by mouth at bedtime as needed  for pain.    Dispense:  20 tablet    Refill:  0    Followup 2 months/continued to increase activity/seek out yoga/boyfriend referred to community clinic

## 2012-10-11 ENCOUNTER — Telehealth: Payer: Self-pay

## 2012-10-11 DIAGNOSIS — M545 Low back pain: Secondary | ICD-10-CM

## 2012-10-11 NOTE — Telephone Encounter (Signed)
Oxycodone is intended for short course only. She is to use Meloxicam and can take Flexeril I have called her and advise. Patient states she went to a yoga class with back pain after the class. Pain has gotten worse since doing the yoga. I advised her to not do the yoga until she feels better, she agrees. Patient wants to know if she can go for physical therapy and get a renewal on the pain meds. Please advise. Pended PT order

## 2012-10-11 NOTE — Telephone Encounter (Signed)
PT STATES SHE WAS GIVEN SOME PAIN MEDICINE AND TOLD TO TAKE TWICE A DAY BUT HE ONLY GAVE HER 20 PILLS AND SHE IS ABOUT OUT PLEASE CALL [phone]

## 2012-10-12 MED ORDER — OXYCODONE-ACETAMINOPHEN 5-325 MG PO TABS: 1.0000 | ORAL_TABLET | Freq: Every evening | ORAL | Status: DC | PRN

## 2012-10-12 NOTE — Telephone Encounter (Signed)
Patient notified and voiced understanding.

## 2012-10-12 NOTE — Telephone Encounter (Signed)
Meds ordered this encounter  Medications  . oxyCODONE-acetaminophen (ROXICET) 5-325 MG per tablet    Sig: Take 1 tablet by mouth at bedtime as needed for pain.    Dispense:  20 tablet    Refill:  0  ref PT sent

## 2012-11-08 ENCOUNTER — Ambulatory Visit: Payer: BC Managed Care – PPO | Admitting: Internal Medicine

## 2012-11-08 VITALS — BP 118/62 | HR 91 | Temp 99.1°F | Resp 18 | Ht 71.0 in | Wt 201.0 lb

## 2012-11-08 DIAGNOSIS — G8929 Other chronic pain: Secondary | ICD-10-CM

## 2012-11-08 DIAGNOSIS — J3501 Chronic tonsillitis: Secondary | ICD-10-CM

## 2012-11-08 DIAGNOSIS — J039 Acute tonsillitis, unspecified: Secondary | ICD-10-CM

## 2012-11-08 DIAGNOSIS — R509 Fever, unspecified: Secondary | ICD-10-CM

## 2012-11-08 LAB — POCT INFLUENZA A/B
Influenza A, POC: NEGATIVE
Influenza B, POC: NEGATIVE

## 2012-11-08 MED ORDER — AMOXICILLIN 500 MG PO CAPS: 1000.0000 mg | ORAL_CAPSULE | Freq: Two times a day (BID) | ORAL | Status: AC

## 2012-11-08 MED ORDER — OXYCODONE-ACETAMINOPHEN 5-325 MG PO TABS: 1.0000 | ORAL_TABLET | Freq: Every evening | ORAL | Status: DC | PRN

## 2012-11-08 NOTE — Progress Notes (Signed)
This chart was scribed for Ellamae Sia, MD by Joaquin Music, ED Scribe. This patient was seen in room Room 11 and the patient's care was started at 1:34 PM  Subjective:    Patient ID: [name], female    DOB: 10-06-1990, 22 y.o.   MRN: [id number]  HPI KRYSIA ZAHRADNIK is a 22 y.o. female who presents to the [facility] complaining of fatigue, sore throat, cough, body aches, chills, and fevers since yesterday. Pt is concerned of possible having the flu. She states she feels she was hallucinating last night. Pt states she woke up sweating. Pt states she has been coughing up sputum and has noticed some blood. Pt is an every day smoker.   Pt has a history of chronic tonsillitis and is awaiting ENT evaluation.   Her chronic back pain is improving with conditioning and weight loss. She still needs pain medication to sleep on occasion. She is awaiting orthopedic evaluation.   Patient Active Problem List   Diagnosis Date Noted   Schizoaffective disorder, bipolar type 03/17/2011   Cocaine abuse 03/17/2011   Cannabis abuse 03/17/2011   TOBACCO USER 10/13/2008   IRREGULAR MENSTRUAL CYCLE 07/10/2008   BIPOLAR DISORDER UNSPECIFIED 06/24/2007   FATIGUE 06/24/2007       Review of Systems  Constitutional: Positive for fever, chills and fatigue.  HENT: Positive for sore throat.   Respiratory: Positive for cough.   Psychiatric/Behavioral: Positive for hallucinations.      Objective:   Physical Exam  Nursing note and vitals reviewed. Constitutional: She is oriented to person, place, and time. She appears well-developed and well-nourished. No distress.  HENT:  Head: Normocephalic and atraumatic.  2+ tonsils with exudate bilaterally. 1+ AC nodes non-tender.   Eyes: Conjunctivae and EOM are normal. Pupils are equal, round, and reactive to light.  Neck: Neck supple. No thyromegaly present.  Cardiovascular: Normal rate, regular rhythm and normal heart sounds.    Pulmonary/Chest: Effort normal and breath sounds normal. No respiratory distress.  Musculoskeletal: Normal range of motion.  Neurological: She is alert and oriented to person, place, and time. She has normal reflexes.  Skin: Skin is warm and dry.  Psychiatric:  As usual, pt is very easily angered. Thought content demonstrates a belief in non-scientific principles regarding health care.   BP 118/62   Pulse 91   Temp(Src) 99.1 F (37.3 C) (Oral)   Resp 18   Ht 5\' 11"  (1.803 m)   Wt 201 lb (91.173 kg)   BMI 28.05 kg/m2   SpO2 98%   LMP 10/25/2012        Assessment & Plan:  The encounter diagnosis was Fever. Chronic tonsillitis Back pain Chronic Schizoaffective Disorder  Meds ordered this encounter  Medications   oxyCODONE-acetaminophen (ROXICET) 5-325 MG per tablet    Sig: Take 1 tablet by mouth at bedtime as needed for pain.    Dispense:  20 tablet    Refill:  0   amoxicillin (AMOXIL) 500 MG capsule    Sig: Take 2 capsules (1,000 mg total) by mouth 2 (two) times daily.    Dispense:  28 capsule    Refill:  0    I have completed the patient encounter in its entirety as documented by the scribe, with editing by me where necessary. Robert P. Merla Riches, M.D.

## 2012-12-10 ENCOUNTER — Ambulatory Visit (INDEPENDENT_AMBULATORY_CARE_PROVIDER_SITE_OTHER): Payer: BC Managed Care – PPO | Admitting: Family Medicine

## 2012-12-10 VITALS — BP 116/62 | HR 89 | Temp 98.5°F | Resp 18 | Ht 71.0 in | Wt 203.0 lb

## 2012-12-10 DIAGNOSIS — M25551 Pain in right hip: Secondary | ICD-10-CM

## 2012-12-10 DIAGNOSIS — R059 Cough, unspecified: Secondary | ICD-10-CM

## 2012-12-10 DIAGNOSIS — R05 Cough: Secondary | ICD-10-CM

## 2012-12-10 DIAGNOSIS — M25559 Pain in unspecified hip: Secondary | ICD-10-CM

## 2012-12-10 DIAGNOSIS — F17200 Nicotine dependence, unspecified, uncomplicated: Secondary | ICD-10-CM

## 2012-12-10 DIAGNOSIS — G894 Chronic pain syndrome: Secondary | ICD-10-CM

## 2012-12-10 NOTE — Progress Notes (Signed)
Subjective: Patient is here for a right hip which is hurting her for the last 2 days. She says she just woke up and it was hurting her. She knows of no injury.  She asked about a cough that she's been having. She's been bothered by this off and on for the last several months. She is coughing up purulent phlegm. She says she gets some antibiotics on hand and takes them when she needs them. She also is concerned about her tonsils which were infected last time she was here. She says she has had peritonsillar infections in the past and had to be lanced. She wonders whether she needs to have her tonsils taken out. She continues to smoke on a regular basis. She asked me for would be the best way to quit.  She complains of chronic back pain. She incidentally asked if I would refill her Percocet for her back. Dr. Merla Riches has been prescribing her medication apparently. She has not been to a pain clinic.  She has not had any x-rays done she doesn't have the money for that.  She asked to check and see what her weight and since last time. She says she's been on a strict 1200-calorie diet and exercising daily  Objective: TMs are normal. Throat has moderately large tonsils not erythematous or infected looking this time. Without nodes. Chest clear to auscultation. Heart regular without murmurs. Abdomen soft nontender. Hip has good range of motion with no point tenderness or excessive pain.  Assessment:  Right hip pain History of chronic pain syndrome Chronic back pain Chronic opioid use for back pain Nonspecific tonsillar enlargement Cough Tobacco abuse  Plan:  As not please her narcotic pain medication. She needs to see the regular person for this. Our policy is that the original prescriber is to be prescribing these controlled substances, and for chronic pain medications I recommended going to a pain center. Urged her for her sake at her age to try and get off of using chronic pain  medication.Recommended Naprosyn 440 mg twice daily for the hip Told her I would not give her an antibiotic at this time, though she says she has some.  Talked a lot about smoking secession. She asked what her weight was. Told her the weight has been essentially the same since this summer she abruptly said "you mean to tell me that sticking to a 1200-calorie diet and exercising everyday I cannot lose weight." Responded that I didn't say that, then I felt like with those properties that she could lose weight. She needed to be completely honest with herself or her dietary intake. She stormed out of the room at that point in a very abrupt fashion, not willing to take anything or do anything. I called after her and told her she needed to get her discharge instruction sheet. She asked that I will let Dr. Merla Riches know that she was here. Marland Kitchen

## 2012-12-10 NOTE — Patient Instructions (Addendum)
Aleve (naprosyn) 220 two pills twice daily for pain and inflammation of hip  Return if hip pain persists  Stop smoking!  Mucinex if needed to thin secretions.  Return to see Dr. Merla Riches to discuss pain control.   Suggest discussing referral to a pain clinic.

## 2012-12-14 ENCOUNTER — Ambulatory Visit: Payer: BC Managed Care – PPO | Admitting: Internal Medicine

## 2013-01-08 ENCOUNTER — Telehealth: Payer: Self-pay

## 2013-01-08 NOTE — Telephone Encounter (Signed)
Pt took half of a fentanyl for her back pain an now can not keep anything down and would like to know what to do next   Best number [phone]

## 2013-01-08 NOTE — Telephone Encounter (Signed)
Advised pt that she needs to see a doctor.  We did not prescribe this for her and no one did, I am assuming she bought it off the street somewhere.  She states she is pregnant and have thrown up several times.  Again I advised her that she must be seen because she could have possibly got too much of the medication.

## 2013-01-08 NOTE — Telephone Encounter (Signed)
Pt took half of a fentanyl and is now not able to keep anything down   Best number [phone]

## 2013-02-05 ENCOUNTER — Encounter (HOSPITAL_COMMUNITY): Payer: Self-pay | Admitting: Emergency Medicine

## 2013-02-05 ENCOUNTER — Telehealth: Payer: Self-pay

## 2013-02-05 ENCOUNTER — Emergency Department (INDEPENDENT_AMBULATORY_CARE_PROVIDER_SITE_OTHER)
Admission: EM | Admit: 2013-02-05 | Discharge: 2013-02-05 | Disposition: A | Discharge status: Home / Self Care | Payer: Self-pay | Source: Home / Self Care

## 2013-02-05 DIAGNOSIS — M542 Cervicalgia: Secondary | ICD-10-CM

## 2013-02-05 DIAGNOSIS — S39012A Strain of muscle, fascia and tendon of lower back, initial encounter: Secondary | ICD-10-CM

## 2013-02-05 DIAGNOSIS — S43499A Other sprain of unspecified shoulder joint, initial encounter: Secondary | ICD-10-CM

## 2013-02-05 DIAGNOSIS — M545 Low back pain, unspecified: Secondary | ICD-10-CM

## 2013-02-05 DIAGNOSIS — S46819A Strain of other muscles, fascia and tendons at shoulder and upper arm level, unspecified arm, initial encounter: Secondary | ICD-10-CM

## 2013-02-05 DIAGNOSIS — G8929 Other chronic pain: Secondary | ICD-10-CM

## 2013-02-05 DIAGNOSIS — S335XXA Sprain of ligaments of lumbar spine, initial encounter: Secondary | ICD-10-CM

## 2013-02-05 MED ORDER — DICLOFENAC POTASSIUM 50 MG PO TABS: 50.0000 mg | ORAL_TABLET | Freq: Three times a day (TID) | ORAL | Status: DC

## 2013-02-05 MED ORDER — KETOROLAC TROMETHAMINE 60 MG/2ML IM SOLN
INTRAMUSCULAR | Status: AC
  Filled 2013-02-05: qty 2

## 2013-02-05 MED ORDER — KETOROLAC TROMETHAMINE 60 MG/2ML IM SOLN
60.0000 mg | Freq: Once | INTRAMUSCULAR | Status: AC
  Administered 2013-02-05: 60 mg via INTRAMUSCULAR

## 2013-02-05 MED ORDER — METAXALONE 800 MG PO TABS: 400.0000 mg | ORAL_TABLET | Freq: Three times a day (TID) | ORAL | Status: DC
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
## 2013-02-05 NOTE — Telephone Encounter (Signed)
Spoke with pt, advised her that Dr. Merla Richesoolittle is not going to be in until Jan 23rd. I advised her to RTC to been by another doctor. I am confused on why her doctor could Rx anything for her. Pt will come in to be seen.
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
## 2013-02-06 NOTE — ED Provider Notes (Signed)
Medical screening examination/treatment/procedure(s) were performed by resident physician or non-physician practitioner and as supervising physician I was immediately available for consultation/collaboration.   KINDL,JAMES DOUGLAS MD.   James D Kindl, MD 02/06/13 1758 

## 2013-02-07 ENCOUNTER — Ambulatory Visit: Payer: BC Managed Care – PPO | Admitting: Physician Assistant

## 2013-02-07 ENCOUNTER — Ambulatory Visit: Payer: BC Managed Care – PPO

## 2013-02-07 VITALS — BP 118/70 | HR 70 | Temp 98.7°F | Resp 18 | Ht 70.0 in | Wt 200.0 lb

## 2013-02-07 DIAGNOSIS — S161XXA Strain of muscle, fascia and tendon at neck level, initial encounter: Secondary | ICD-10-CM

## 2013-02-07 DIAGNOSIS — M542 Cervicalgia: Secondary | ICD-10-CM

## 2013-02-07 DIAGNOSIS — S139XXA Sprain of joints and ligaments of unspecified parts of neck, initial encounter: Secondary | ICD-10-CM

## 2013-02-07 MED ORDER — TRAMADOL HCL 50 MG PO TABS: 50.0000 mg | ORAL_TABLET | Freq: Three times a day (TID) | ORAL | Status: DC | PRN

## 2013-02-07 MED ORDER — DICLOFENAC SODIUM 75 MG PO TBEC: 75.0000 mg | DELAYED_RELEASE_TABLET | Freq: Two times a day (BID) | ORAL | Status: DC

## 2013-02-07 NOTE — Patient Instructions (Addendum)
Heat  Get out of collar and do gentle ROM to "wake up" the muscles Take NSAID and muscle relaxers - use pain pills for breakthrough pain.  Call Dr Cherly Hensenousins and make an appt for pap smear/gyn exam and think seriously about a long term reversible contraception like Nexplanon or IUD.
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
## 2013-03-05 ENCOUNTER — Ambulatory Visit (INDEPENDENT_AMBULATORY_CARE_PROVIDER_SITE_OTHER): Payer: BC Managed Care – PPO | Admitting: Internal Medicine

## 2013-03-05 VITALS — BP 118/78 | HR 88 | Temp 98.7°F | Resp 18 | Ht 71.0 in | Wt 196.4 lb

## 2013-03-05 DIAGNOSIS — Z6827 Body mass index (BMI) 27.0-27.9, adult: Secondary | ICD-10-CM | POA: Insufficient documentation

## 2013-03-05 DIAGNOSIS — M549 Dorsalgia, unspecified: Secondary | ICD-10-CM

## 2013-03-05 DIAGNOSIS — M542 Cervicalgia: Secondary | ICD-10-CM

## 2013-03-05 DIAGNOSIS — G8929 Other chronic pain: Secondary | ICD-10-CM | POA: Insufficient documentation

## 2013-03-05 DIAGNOSIS — E663 Overweight: Secondary | ICD-10-CM

## 2013-03-05 MED ORDER — TIZANIDINE HCL 4 MG PO CAPS: 4.0000 mg | ORAL_CAPSULE | Freq: Three times a day (TID) | ORAL | Status: DC | PRN

## 2013-03-05 MED ORDER — OXYCODONE-ACETAMINOPHEN 5-325 MG PO TABS: 1.0000 | ORAL_TABLET | Freq: Every day | ORAL | Status: DC
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
## 2013-04-23 ENCOUNTER — Ambulatory Visit: Payer: BC Managed Care – PPO | Admitting: Internal Medicine

## 2013-04-23 VITALS — BP 132/76 | HR 72 | Temp 98.1°F | Resp 16 | Ht 71.0 in | Wt 189.9 lb

## 2013-04-23 DIAGNOSIS — G8929 Other chronic pain: Secondary | ICD-10-CM

## 2013-04-23 DIAGNOSIS — M549 Dorsalgia, unspecified: Secondary | ICD-10-CM

## 2013-04-23 MED ORDER — OXYCODONE-ACETAMINOPHEN 5-325 MG PO TABS: 1.0000 | ORAL_TABLET | Freq: Every day | ORAL | Status: DC
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
## 2013-05-29 ENCOUNTER — Telehealth: Payer: Self-pay

## 2013-05-29 MED ORDER — OXYCODONE-ACETAMINOPHEN 5-325 MG PO TABS: 1.0000 | ORAL_TABLET | Freq: Every day | ORAL | Status: DC

## 2013-05-29 NOTE — Telephone Encounter (Signed)
Meds ordered this encounter  Medications  . oxyCODONE-acetaminophen (ROXICET) 5-325 MG per tablet    Sig: Take 1 tablet by mouth at bedtime. If needed for back pain    Dispense:  20 tablet    Refill:  0

## 2013-05-29 NOTE — Telephone Encounter (Signed)
DOOLITTLE - Pt needs a refill on her oxycodone.  [phone]

## 2013-06-28 ENCOUNTER — Telehealth: Payer: Self-pay

## 2013-06-28 MED ORDER — OXYCODONE-ACETAMINOPHEN 5-325 MG PO TABS: 1.0000 | ORAL_TABLET | Freq: Every day | ORAL | Status: DC

## 2013-06-28 NOTE — Telephone Encounter (Signed)
Ok to ref again BUT If she continues to have this much trouble with her back I will want to set up a spine exam by an orthopedist

## 2013-06-28 NOTE — Telephone Encounter (Signed)
Refill oxyCODONE-acetaminophen (ROXICET) 5-325 MG per tablet

## 2013-06-29 NOTE — Telephone Encounter (Signed)
Rx in pick up drawer. VM not set up.

## 2013-06-30 NOTE — Telephone Encounter (Signed)
rx has been picked up

## 2013-07-20 ENCOUNTER — Telehealth: Payer: Self-pay

## 2013-07-20 NOTE — Telephone Encounter (Signed)
PATIENT NEEDS NEW REFILLS FOR BIRTH CONTROL. PLEASE CALL!

## 2013-07-27 NOTE — Telephone Encounter (Signed)
Patient called back and accused me of hanging up on her. I did not hang up on her - I placed her initial call on hold while I attempted to find someone from clinical for her to speak with at her request. During her 2nd call, she made several comments regarding our staff and how we don't do our jobs, how our staff has bad attitudes, and how the staff makes "rude faces" at her. She also threatened to sue our practice if she were to get pregnant because she is out of her medication. Patient also threatened to "call the feds" on us because we cannot take care of her needs or the needs of our other sick patients. Repeatedly attempted to explain to patient that I was not trying to upset her in any way and that I was trying to help route her to someone who could refill the prescription, however, she became increasingly upset and continued making derogatory comments. Advised patient that I was not going to continue the hostile conversation, that we would return her call, and for her to have a nice day.

## 2013-07-27 NOTE — Telephone Encounter (Signed)
Dr Merla Richesoolittle, it looks like the first call on the 07/20/13 did not route to anyone. When I got this message today, I checked w/pharm bc I don't see that we have Rxd for pt before. Pharm reported that [facility] (a [facility]/GYN according to [employer]). Dr Merla Richesoolittle, you did discuss related issues last July, 2014, and Maralyn SagoSarah d/w pt 02/07/13 OV, but do you want to Rx it for pt, or do you need to see pt or send her back to Dr Hyacinth MeekerMiller?

## 2013-07-27 NOTE — Telephone Encounter (Signed)
Patient called again regarding her rx refill. Please return call and advise. Thank you.

## 2013-07-27 NOTE — Telephone Encounter (Signed)
I discussed this conversation between pt and Joni Reiningicole Ma Hillock(Jessica Starke) with Dr Katrinka BlazingSmith and among other things said by pt, she threatened to sue practice and she stated that reg clinical staff should not call pt back, only a manager or herself and asked me to forward this to her as well as Dr Merla Richesoolittle.

## 2013-08-01 ENCOUNTER — Telehealth: Payer: Self-pay

## 2013-08-01 DIAGNOSIS — G8929 Other chronic pain: Secondary | ICD-10-CM

## 2013-08-01 DIAGNOSIS — M549 Dorsalgia, unspecified: Principal | ICD-10-CM

## 2013-08-01 MED ORDER — OXYCODONE-ACETAMINOPHEN 5-325 MG PO TABS: 1.0000 | ORAL_TABLET | Freq: Every day | ORAL | Status: DC

## 2013-08-01 NOTE — Telephone Encounter (Signed)
Pt called in and states she needs another RX for Oxycodone. She sees Dr Merla Richesoolittle. She can be reached [phone]/ Thank you

## 2013-08-01 NOTE — Telephone Encounter (Signed)
Does she need to RTC or do we need to set up a spine exam by an orthopedist

## 2013-08-01 NOTE — Telephone Encounter (Signed)
Ok this last time---will need eval by ortho for other treatment and a better identification of what is the underlying problem so I have set up a referral for her  Meds ordered this encounter  Medications  . oxyCODONE-acetaminophen (ROXICET) 5-325 MG per tablet    Sig: Take 1 tablet by mouth at bedtime. If needed for back pain    Dispense:  15 tablet    Refill:  0

## 2013-08-02 NOTE — Telephone Encounter (Signed)
Rx in drawer, tried to call pt - no ans/VM not set up. Put note w/Dr Doolittle's message on Rx. Will try to call later.

## 2013-08-04 NOTE — Telephone Encounter (Signed)
Tried to call pt - no ans/VM not set up

## 2013-08-07 NOTE — Telephone Encounter (Signed)
Pt p/u the rx on 08/02/13. Unable to reach pt to let her know Dr. Netta Corriganoolittle's msg. Letter sent.

## 2013-09-21 ENCOUNTER — Telehealth: Payer: Self-pay

## 2013-09-21 MED ORDER — NORGESTIMATE-ETH ESTRADIOL 0.25-35 MG-MCG PO TABS: 1.0000 | ORAL_TABLET | Freq: Every day | ORAL | Status: DC

## 2013-09-21 NOTE — Telephone Encounter (Signed)
Refill sent.

## 2013-09-21 NOTE — Telephone Encounter (Signed)
Patient sees Dr. Merla Riches and is requesting a medication refill for birth control. norgestimate-ethinyl estradiol, 3 months supply, sent to [facility] on [address]

## 2013-09-23 ENCOUNTER — Telehealth: Payer: Self-pay | Admitting: Internal Medicine

## 2013-09-23 NOTE — Telephone Encounter (Signed)
Patient is calling about her birth control RX. It looks like it was already sent in to the pharmacy but I am not sure if that's been communicated to patient.   [phone]

## 2013-09-24 NOTE — Telephone Encounter (Signed)
Tried to call pt. Mailbox is full.

## 2013-09-25 NOTE — Telephone Encounter (Signed)
Attempted to call pt, mailbox is full unable to lv message.

## 2013-09-26 NOTE — Telephone Encounter (Signed)
Spoke to [facility]- pt picked up script on 8/30

## 2013-09-26 NOTE — Telephone Encounter (Signed)
Unable to leave message

## 2014-01-01 ENCOUNTER — Other Ambulatory Visit: Payer: Self-pay | Admitting: Physician Assistant

## 2014-01-09 ENCOUNTER — Telehealth: Payer: Self-pay | Admitting: Internal Medicine

## 2014-01-09 NOTE — Telephone Encounter (Signed)
Patient states that her pharmacy faxed a refill request on her birth control 5 days ago and she wants to know the progress of that. States it is an emergency and needs her birth control ASAP. [facility] [address]   [phone]

## 2014-01-09 NOTE — Telephone Encounter (Signed)
Please let pt know

## 2014-01-09 NOTE — Telephone Encounter (Signed)
This was approved on 01/02/14-sent to [facility]

## 2014-01-11 NOTE — Telephone Encounter (Signed)
Patient returned call, read the message to her,  She is going to the pharmacy to pick up the script.   Thank you.

## 2014-01-11 NOTE — Telephone Encounter (Signed)
rx is filled and ready at pharmacy.  Tried to call pt but vm is full

## 2014-02-05 ENCOUNTER — Ambulatory Visit (INDEPENDENT_AMBULATORY_CARE_PROVIDER_SITE_OTHER): Payer: 59 | Admitting: Family Medicine

## 2014-02-05 DIAGNOSIS — S61209A Unspecified open wound of unspecified finger without damage to nail, initial encounter: Secondary | ICD-10-CM

## 2014-02-05 DIAGNOSIS — Z23 Encounter for immunization: Secondary | ICD-10-CM

## 2014-02-05 DIAGNOSIS — S61219A Laceration without foreign body of unspecified finger without damage to nail, initial encounter: Secondary | ICD-10-CM

## 2014-02-05 NOTE — Progress Notes (Signed)
Procedure Consent obtained. Local anesthesia with 1cc 1% lido. Cleaned with soap and water. Wound explored. #5 4-0 ethilon sutures placed. Clean dressing placed. Care instructions given.

## 2014-02-05 NOTE — Progress Notes (Signed)
   Subjective:  This chart was scribed for Norberto SorensonEva Teckla Christiansen, MD by Haywood PaoNadim Abu Hashem, ED Scribe at [facility].The patient was seen in exam room 07 and the patient's care was started at 3:55 PM.   Patient ID: Ok AnisLauren E Mumaw, female    DOB: 05-Sep-1990, 24 y.o.   MRN: [id number] No chief complaint on file.   HPI  HPI Comments: Ok AnisLauren E Acevedo is a 24 y.o. female who [facility] complaining of a laceration on the palmer aspect of the right phalanx, onset today. Pt reports she was cleaning her home with bleach because of a cockroach problem. When she was cleaning the oven she cut herself over the metal surface. Pt is unsure of last tetanus shot.   Past Medical History  Diagnosis Date  . Mental health problem   . Atrial septal defect     corrected 1999  . Depression   . Substance abuse   . Anxiety   . Heart murmur    Current Outpatient Prescriptions on File Prior to Visit  Medication Sig Dispense Refill  . norgestimate-ethinyl estradiol (MONONESSA) 0.25-35 MG-MCG tablet Take 1 tablet by mouth daily. PATIENT NEEDS OFFICE VISIT FOR ADDITIONAL REFILLS 28 tablet 0  . oxyCODONE-acetaminophen (ROXICET) 5-325 MG per tablet Take 1 tablet by mouth at bedtime. If needed for back pain 15 tablet 0  . tiZANidine (ZANAFLEX) 4 MG capsule Take 1 capsule (4 mg total) by mouth 3 (three) times daily as needed for muscle spasms. 30 capsule 2   No current facility-administered medications on file prior to visit.  No Known Allergies    Review of Systems  Skin: Positive for wound.       Objective:  There were no vitals taken for this visit.    Physical Exam  Constitutional: She is oriented to person, place, and time. She appears well-developed and well-nourished. No distress.  HENT:  Head: Normocephalic and atraumatic.  Eyes: Pupils are equal, round, and reactive to light.  Neck: Normal range of motion.  Cardiovascular: Normal rate and regular rhythm.   Pulmonary/Chest: Effort  normal. No respiratory distress.  Musculoskeletal: Normal range of motion.  Laceration on palmer aspect of right phalanx. Superficial and crossing both AP joints. Hemostatic and full ROM. Approximately 2 cm length with another 2 cm of very superficial laceration that did not go through the skin proximally.   Neurological: She is alert and oriented to person, place, and time.  Skin: Skin is warm and dry.  Psychiatric: She has a normal mood and affect. Her behavior is normal.  Nursing note and vitals reviewed.      Assessment & Plan:  Need for Tdap vaccination - Plan: Tdap vaccine greater than or equal to 7yo IM  Cut of finger - Plan: Tdap vaccine greater than or equal to 7yo IM - fairly superficial but on palmer aspect and crossing IP joint so require suture repair.   I personally performed the services described in this documentation, which was scribed in my presence. The recorded information has been reviewed and considered, and addended by me as needed.  Norberto SorensonEva Carlen Rebuck, MD MPH

## 2014-02-07 ENCOUNTER — Other Ambulatory Visit: Payer: Self-pay

## 2014-02-07 ENCOUNTER — Other Ambulatory Visit: Payer: Self-pay | Admitting: Internal Medicine

## 2014-02-07 NOTE — Telephone Encounter (Signed)
Pt requesting 6 mths-12 mths  Refill for bc pills   Best phone for pt is [phone]    Pharmacy [facility] [address]

## 2014-02-08 ENCOUNTER — Other Ambulatory Visit: Payer: Self-pay | Admitting: Physician Assistant

## 2014-02-08 NOTE — Telephone Encounter (Signed)
Lm she will need an OV for further refills

## 2014-03-01 ENCOUNTER — Other Ambulatory Visit: Payer: Self-pay

## 2014-03-01 NOTE — Telephone Encounter (Signed)
Called pt to advise that she is overdue for f/up on BCP and we have put messages on last two RFs to advise she needs OV. VM was full and can't LM.

## 2014-03-01 NOTE — Telephone Encounter (Signed)
Pt would like a refill on her norgestimate-ethinyl estradiol (MONONESSA) 0.25-35 MG-MCG tablet [[id number]. Please advise at [phone]

## 2014-03-02 NOTE — Telephone Encounter (Signed)
VM still full. Could not LM

## 2014-03-06 NOTE — Telephone Encounter (Signed)
Pt VM still not taking messages. I will just await pt to call back.

## 2014-05-01 ENCOUNTER — Ambulatory Visit (INDEPENDENT_AMBULATORY_CARE_PROVIDER_SITE_OTHER): Payer: 59 | Admitting: Internal Medicine

## 2014-05-01 VITALS — BP 124/78 | HR 83 | Temp 98.7°F | Resp 17 | Ht 71.0 in | Wt 156.0 lb

## 2014-05-01 DIAGNOSIS — F909 Attention-deficit hyperactivity disorder, unspecified type: Secondary | ICD-10-CM | POA: Diagnosis not present

## 2014-05-01 DIAGNOSIS — N390 Urinary tract infection, site not specified: Secondary | ICD-10-CM

## 2014-05-01 DIAGNOSIS — R8290 Unspecified abnormal findings in urine: Secondary | ICD-10-CM

## 2014-05-01 DIAGNOSIS — F250 Schizoaffective disorder, bipolar type: Secondary | ICD-10-CM | POA: Diagnosis not present

## 2014-05-01 DIAGNOSIS — F988 Other specified behavioral and emotional disorders with onset usually occurring in childhood and adolescence: Secondary | ICD-10-CM

## 2014-05-01 DIAGNOSIS — R8281 Pyuria: Secondary | ICD-10-CM

## 2014-05-01 LAB — POCT UA - MICROSCOPIC ONLY
CASTS, UR, LPF, POC: NEGATIVE
CRYSTALS, UR, HPF, POC: NEGATIVE
Mucus, UA: NEGATIVE
RENAL TUBULAR CELLS: POSITIVE
Yeast, UA: NEGATIVE

## 2014-05-01 LAB — POCT URINALYSIS DIPSTICK
Bilirubin, UA: NEGATIVE
GLUCOSE UA: NEGATIVE
Ketones, UA: NEGATIVE
Nitrite, UA: POSITIVE
Protein, UA: 30
SPEC GRAV UA: 1.025
Urobilinogen, UA: 0.2
pH, UA: 5.5

## 2014-05-01 MED ORDER — AMPHETAMINE-DEXTROAMPHETAMINE 10 MG PO TABS: 10.0000 mg | ORAL_TABLET | Freq: Two times a day (BID) | ORAL | Status: DC

## 2014-05-01 MED ORDER — CIPROFLOXACIN HCL 500 MG PO TABS: 500.0000 mg | ORAL_TABLET | Freq: Two times a day (BID) | ORAL | Status: DC

## 2014-05-01 MED ORDER — ARIPIPRAZOLE 10 MG PO TABS: 10.0000 mg | ORAL_TABLET | Freq: Every day | ORAL | Status: DC

## 2014-05-01 MED ORDER — ALPRAZOLAM 0.5 MG PO TABS: 0.2500 mg | ORAL_TABLET | Freq: Three times a day (TID) | ORAL | Status: DC | PRN

## 2014-05-01 MED ORDER — NORGESTIMATE-ETH ESTRADIOL 0.25-35 MG-MCG PO TABS: 1.0000 | ORAL_TABLET | Freq: Every day | ORAL | Status: DC

## 2014-05-01 NOTE — Progress Notes (Signed)
Subjective:  This chart was scribed for Tonye Pearsonobert P Chai Verdejo, MD by Charline BillsEssence Howell, ED Scribe. The patient was seen in room 4. Patient's care was started at 5:26 PM.   Patient ID: [name], female    DOB: 06-23-1990, 24 y.o.   MRN: [id number]  Chief Complaint  Patient presents with  . Anxiety  . urine odor  . ADHD   HPI HPI Comments: She returns for her  first visit with me in a year [name] is a 24 y.o. female, with a h/o anxiety, bipolar depression, substance abuse, who presents to [facility] complaining of increased anxiety during the day over the past several weeks. Pt states that anxiety does not interfere with her sleep since she takes PMs to assist with sleep, but it is affecting her ability to work.  Pt wants to restart all of her psychiatric meds.  After several years of stormy lifestyle affected by bipolar schizoaffective disorder with anxiety depression and substance abuse of multiple drugs she has resolved her [place] lifestyle with the help of a new partner. 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 She has a supportive her mother. She is off of drugs. When she was in school before she was treated by Dr. Dub MikesLugo for her attention deficit disorder and she would like to restart these medications as well. She has been on a number of medications that have not worked in the past including Cymbalta the Effexor and a few SSRIs. She got good response to resperidal and Abilify but not to lithium or Depakote. She was last on medication from Dr. Allena Katzeadling and Dr. Andrey CampanileWilson. That was 2 years ago.  Pt also requests a refill of birth control.   Urinary Symptoms Pt reports recent urinary malodor for the past few weeks that occurs daily. She denies urinary frequency, dysuria, vaginal discharge, vaginal irritation, having to get up during the night. LNMP 2.5 weeks ago. One partner. No dyspareunia. No risk for pregnancy right now.  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 She would like an opportunity to really do well with starting a new chapter of her life.  Patient Active Problem List   Diagnosis Date Noted  . Attention deficit disorder 05/01/2014  . Chronic back pain 03/05/2013  . BMI 27.0-27.9,adult 03/05/2013  . Schizoaffective disorder, bipolar type 03/17/2011  . Cocaine abuse 03/17/2011  . Cannabis abuse 03/17/2011    No current outpatient prescriptions on file prior to visit.   No current facility-administered medications on file prior to visit.   No Known Allergies  Review of Systems  Genitourinary: Negative for dysuria, frequency and vaginal discharge.  Psychiatric/Behavioral: The patient is nervous/anxious.    she has lost a significant amount of weight on purpose in her low back pain has resolved Wt Readings from Last 3 Encounters:  05/01/14 156 lb (70.761 kg)  04/23/13 189 lb 14.4 oz (86.138 kg)  03/05/13 196 lb 6 oz (89.075 kg)       Objective:   Physical Exam  Constitutional: She is oriented to person, place, and time. She appears well-developed and well-nourished. No distress.  HENT:  Head: Normocephalic and atraumatic.  Eyes: Conjunctivae and EOM are normal. Pupils are equal, round, and reactive to light.  Neck: Neck supple.  Cardiovascular: Normal rate.   Pulmonary/Chest: Effort normal.  Musculoskeletal: Normal range of motion.  Neurological: She is alert and oriented to person, place,  and time. No cranial nerve deficit.  Skin: Skin is warm and dry.  Psychiatric: She has a normal mood and affect. Her behavior is normal. Judgment and thought content normal.  Nursing note and vitals reviewed. BP 124/78 mmHg  Pulse 83  Temp(Src) 98.7 F (37.1 C) (Oral)  Resp 17  Ht  (1.803 m)  Wt 156 lb (70.761 kg)  BMI 21.77 kg/m2  SpO2 100%  LMP 04/19/2014  Results for orders placed or performed in visit on 05/01/14  POCT UA -  Microscopic Only  Result Value Ref Range   WBC, Ur, HPF, POC TNTC    RBC, urine, microscopic 5-10    Bacteria, U Microscopic 4+    Mucus, UA NEG    Epithelial cells, urine per micros TNTC    Crystals, Ur, HPF, POC NEG    Casts, Ur, LPF, POC NEG    Yeast, UA NEG    Renal tubular cells POS   POCT urinalysis dipstick  Result Value Ref Range   Color, UA yellow    Clarity, UA cloudy    Glucose, UA neg    Bilirubin, UA neg    Ketones, UA neg    Spec Grav, UA 1.025    Blood, UA small    pH, UA 5.5    Protein, UA 30    Urobilinogen, UA 0.2    Nitrite, UA pos    Leukocytes, UA large (3+)        Assessment & Plan:  Bad odor of urine /Pyuria =UTI- Plan: Urine culture/start cipro 500bid  Attention deficit disorder--restart medications Adderall 10 mg twice a day  Mood disorder (Schizoaffective disorder, bipolar type)--with mild depression and moderate anxiety  Restart antipsychotics with Abilify 10 mg  Need for contraception  Restart OCPs  Follow-up in one month by appointment Start exercise Consider psychiatric evaluation if not responding  I have completed the patient encounter in its entirety as documented by the scribe, with editing by me where necessary. Majed Pellegrin P. Merla Riches, M.D.

## 2014-05-01 NOTE — Patient Instructions (Signed)
recheck 4 weeks

## 2014-05-02 ENCOUNTER — Other Ambulatory Visit: Payer: Self-pay

## 2014-05-02 ENCOUNTER — Telehealth: Payer: Self-pay

## 2014-05-02 MED ORDER — ARIPIPRAZOLE 10 MG PO TABS
10.0000 mg | ORAL_TABLET | Freq: Every day | ORAL | Status: DC
Start: 2014-05-02 — End: 2014-07-29

## 2014-05-02 NOTE — Telephone Encounter (Signed)
Pharm sent req for 90 day supply of Abilify instead of just 30 that was sent. I see you wanted 1 mos f/up w/pt, Dr Merla Richesoolittle. Do you want to OK the 90 day fill?

## 2014-05-02 NOTE — Telephone Encounter (Signed)
PA started for Adderall. PA approved faxed to pharm.

## 2014-05-04 LAB — URINE CULTURE

## 2014-05-08 ENCOUNTER — Telehealth: Payer: Self-pay

## 2014-05-08 NOTE — Telephone Encounter (Signed)
Pt was returning our call.  She missed the call she believes was probably about her urinalysis.  Her voicemail is full, so she doesn't know who called.  [phone]

## 2014-05-09 NOTE — Telephone Encounter (Signed)
Mailbox full

## 2014-05-29 ENCOUNTER — Other Ambulatory Visit: Payer: Self-pay

## 2014-05-29 NOTE — Telephone Encounter (Signed)
Patient needs a refill on adderral and xanax. Patient wants Dr. Merla Richesoolittle that everything is going well. Please call when ready for pick up! [phone]

## 2014-05-30 MED ORDER — ALPRAZOLAM 0.5 MG PO TABS: 0.2500 mg | ORAL_TABLET | Freq: Three times a day (TID) | ORAL | Status: DC | PRN

## 2014-05-30 MED ORDER — AMPHETAMINE-DEXTROAMPHETAMINE 10 MG PO TABS
10.0000 mg | ORAL_TABLET | Freq: Two times a day (BID) | ORAL | Status: DC
Start: 2014-05-30 — End: 2014-08-01

## 2014-05-31 NOTE — Telephone Encounter (Addendum)
Notified pt on VM that Rxs ready. 

## 2014-06-04 ENCOUNTER — Telehealth: Payer: Self-pay | Admitting: Internal Medicine

## 2014-06-04 NOTE — Telephone Encounter (Signed)
Patient cancelled her appointment for 06/06/2014. She is wondering if she can still have her medication refilled and try to come see him at a later date.   [phone]

## 2014-06-05 NOTE — Telephone Encounter (Signed)
Called pt and mailbox is full

## 2014-06-06 ENCOUNTER — Ambulatory Visit: Payer: 59 | Admitting: Internal Medicine

## 2014-06-07 NOTE — Telephone Encounter (Signed)
Called pt, left message to call back if she still needs my help.

## 2014-06-08 ENCOUNTER — Telehealth: Payer: Self-pay

## 2014-06-08 NOTE — Telephone Encounter (Signed)
[name] please call pt again. See previous message

## 2014-06-11 NOTE — Telephone Encounter (Signed)
Left message to call me back.

## 2014-07-29 ENCOUNTER — Telehealth: Payer: Self-pay | Admitting: Radiology

## 2014-07-29 ENCOUNTER — Ambulatory Visit (INDEPENDENT_AMBULATORY_CARE_PROVIDER_SITE_OTHER): Payer: 59 | Admitting: Internal Medicine

## 2014-07-29 VITALS — BP 120/70 | HR 85 | Temp 98.4°F | Resp 16 | Ht 71.0 in | Wt 158.0 lb

## 2014-07-29 DIAGNOSIS — F17200 Nicotine dependence, unspecified, uncomplicated: Secondary | ICD-10-CM

## 2014-07-29 DIAGNOSIS — F909 Attention-deficit hyperactivity disorder, unspecified type: Secondary | ICD-10-CM | POA: Diagnosis not present

## 2014-07-29 DIAGNOSIS — F250 Schizoaffective disorder, bipolar type: Secondary | ICD-10-CM | POA: Diagnosis not present

## 2014-07-29 DIAGNOSIS — M549 Dorsalgia, unspecified: Secondary | ICD-10-CM

## 2014-07-29 DIAGNOSIS — G8929 Other chronic pain: Secondary | ICD-10-CM

## 2014-07-29 DIAGNOSIS — F1410 Cocaine abuse, uncomplicated: Secondary | ICD-10-CM | POA: Diagnosis not present

## 2014-07-29 DIAGNOSIS — Z720 Tobacco use: Secondary | ICD-10-CM

## 2014-07-29 DIAGNOSIS — F1210 Cannabis abuse, uncomplicated: Secondary | ICD-10-CM | POA: Diagnosis not present

## 2014-07-29 DIAGNOSIS — F988 Other specified behavioral and emotional disorders with onset usually occurring in childhood and adolescence: Secondary | ICD-10-CM

## 2014-07-29 MED ORDER — ALPRAZOLAM 1 MG PO TABS: 1.0000 mg | ORAL_TABLET | Freq: Three times a day (TID) | ORAL | Status: DC

## 2014-07-29 NOTE — Telephone Encounter (Signed)
Patient needs follow up with Dr Merla Richesoolittle he has told her to come in Wed at 10:15 as a work in
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
## 2014-08-01 ENCOUNTER — Encounter: Payer: Self-pay | Admitting: Internal Medicine

## 2014-08-01 ENCOUNTER — Ambulatory Visit (INDEPENDENT_AMBULATORY_CARE_PROVIDER_SITE_OTHER): Payer: 59 | Admitting: Internal Medicine

## 2014-08-01 VITALS — BP 111/72 | HR 78 | Temp 98.3°F | Resp 16 | Ht 71.5 in | Wt 150.2 lb

## 2014-08-01 DIAGNOSIS — M549 Dorsalgia, unspecified: Secondary | ICD-10-CM | POA: Diagnosis not present

## 2014-08-01 DIAGNOSIS — F909 Attention-deficit hyperactivity disorder, unspecified type: Secondary | ICD-10-CM

## 2014-08-01 DIAGNOSIS — F1410 Cocaine abuse, uncomplicated: Secondary | ICD-10-CM

## 2014-08-01 DIAGNOSIS — F250 Schizoaffective disorder, bipolar type: Secondary | ICD-10-CM | POA: Diagnosis not present

## 2014-08-01 DIAGNOSIS — M542 Cervicalgia: Secondary | ICD-10-CM | POA: Diagnosis not present

## 2014-08-01 DIAGNOSIS — G8929 Other chronic pain: Secondary | ICD-10-CM | POA: Diagnosis not present

## 2014-08-01 DIAGNOSIS — M25579 Pain in unspecified ankle and joints of unspecified foot: Secondary | ICD-10-CM

## 2014-08-01 DIAGNOSIS — F988 Other specified behavioral and emotional disorders with onset usually occurring in childhood and adolescence: Secondary | ICD-10-CM

## 2014-08-01 MED ORDER — OXYCODONE-ACETAMINOPHEN 5-325 MG PO TABS: 1.0000 | ORAL_TABLET | Freq: Every evening | ORAL | Status: DC | PRN

## 2014-08-01 MED ORDER — AMPHETAMINE-DEXTROAMPHETAMINE 10 MG PO TABS
10.0000 mg | ORAL_TABLET | Freq: Two times a day (BID) | ORAL | Status: DC
Start: 2014-08-01 — End: 2014-08-22

## 2014-08-01 MED ORDER — ALPRAZOLAM 1 MG PO TABS: 1.0000 mg | ORAL_TABLET | Freq: Three times a day (TID) | ORAL | Status: DC
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
## 2014-08-10 ENCOUNTER — Telehealth: Payer: Self-pay | Admitting: Internal Medicine
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
## 2014-08-10 NOTE — Telephone Encounter (Signed)
Patient is calling again. [name] states that [name] understands how long the prescription process is but wants Dr. Merla Richesoolittle to know [name] is completely out of medication.
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
## 2014-08-22 ENCOUNTER — Encounter: Payer: Self-pay | Admitting: Internal Medicine

## 2014-08-22 ENCOUNTER — Ambulatory Visit (INDEPENDENT_AMBULATORY_CARE_PROVIDER_SITE_OTHER): Payer: 59 | Admitting: Internal Medicine

## 2014-08-22 VITALS — BP 111/73 | HR 80 | Temp 98.1°F | Resp 16 | Ht 71.0 in | Wt 151.0 lb

## 2014-08-22 DIAGNOSIS — M542 Cervicalgia: Secondary | ICD-10-CM | POA: Diagnosis not present

## 2014-08-22 DIAGNOSIS — G8929 Other chronic pain: Secondary | ICD-10-CM | POA: Diagnosis not present

## 2014-08-22 DIAGNOSIS — M545 Low back pain: Secondary | ICD-10-CM

## 2014-08-22 DIAGNOSIS — F3112 Bipolar disorder, current episode manic without psychotic features, moderate: Secondary | ICD-10-CM

## 2014-08-22 DIAGNOSIS — M7918 Myalgia, other site: Secondary | ICD-10-CM

## 2014-08-22 MED ORDER — MELOXICAM 15 MG PO TABS: 15.0000 mg | ORAL_TABLET | Freq: Every day | ORAL | Status: DC

## 2014-08-22 MED ORDER — AMPHETAMINE-DEXTROAMPHETAMINE 10 MG PO TABS: 10.0000 mg | ORAL_TABLET | Freq: Two times a day (BID) | ORAL | Status: DC

## 2014-08-22 MED ORDER — RISPERIDONE 0.25 MG PO TABS: 0.2500 mg | ORAL_TABLET | Freq: Every day | ORAL | Status: DC

## 2014-08-22 MED ORDER — LORAZEPAM 2 MG PO TABS: 2.0000 mg | ORAL_TABLET | Freq: Three times a day (TID) | ORAL | Status: DC | PRN

## 2014-08-22 MED ORDER — OXYCODONE-ACETAMINOPHEN 5-325 MG PO TABS: 1.0000 | ORAL_TABLET | Freq: Every evening | ORAL | Status: DC | PRN
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
## 2014-08-23 ENCOUNTER — Other Ambulatory Visit: Payer: Self-pay

## 2014-08-23 MED ORDER — RISPERIDONE 0.25 MG PO TABS: 0.2500 mg | ORAL_TABLET | Freq: Every day | ORAL | Status: DC

## 2014-08-23 NOTE — Telephone Encounter (Signed)
Pharm sent req for a 90 day supply of risperidone. Dr Merla Riches, you only wrote for 1 mos w/out RFs. Do you want to OK the 90 day supply? Pended.

## 2014-08-27 NOTE — Progress Notes (Signed)
Faxed letter

## 2014-09-19 ENCOUNTER — Ambulatory Visit (INDEPENDENT_AMBULATORY_CARE_PROVIDER_SITE_OTHER): Payer: 59 | Admitting: Internal Medicine

## 2014-09-19 ENCOUNTER — Encounter: Payer: Self-pay | Admitting: Internal Medicine

## 2014-09-19 VITALS — BP 110/88 | HR 86 | Temp 98.6°F | Resp 16 | Ht 71.0 in | Wt 155.0 lb

## 2014-09-19 DIAGNOSIS — F250 Schizoaffective disorder, bipolar type: Secondary | ICD-10-CM

## 2014-09-19 DIAGNOSIS — G8929 Other chronic pain: Secondary | ICD-10-CM | POA: Diagnosis not present

## 2014-09-19 DIAGNOSIS — M549 Dorsalgia, unspecified: Secondary | ICD-10-CM | POA: Diagnosis not present

## 2014-09-19 DIAGNOSIS — F988 Other specified behavioral and emotional disorders with onset usually occurring in childhood and adolescence: Secondary | ICD-10-CM

## 2014-09-19 DIAGNOSIS — F909 Attention-deficit hyperactivity disorder, unspecified type: Secondary | ICD-10-CM

## 2014-09-21 MED ORDER — OXYCODONE-ACETAMINOPHEN 5-325 MG PO TABS: 1.0000 | ORAL_TABLET | Freq: Every evening | ORAL | Status: DC | PRN

## 2014-09-21 MED ORDER — MELOXICAM 15 MG PO TABS: 15.0000 mg | ORAL_TABLET | Freq: Every day | ORAL | Status: DC

## 2014-09-21 MED ORDER — AMPHETAMINE-DEXTROAMPHETAMINE 10 MG PO TABS: 10.0000 mg | ORAL_TABLET | Freq: Two times a day (BID) | ORAL | Status: DC

## 2014-09-21 MED ORDER — LORAZEPAM 2 MG PO TABS: 2.0000 mg | ORAL_TABLET | Freq: Three times a day (TID) | ORAL | Status: DC | PRN

## 2014-09-21 MED ORDER — RISPERIDONE 0.5 MG PO TABS: 0.5000 mg | ORAL_TABLET | Freq: Every day | ORAL | Status: DC

## 2014-09-21 NOTE — Progress Notes (Signed)
F/u Chief Complaint  Patient presents with  . Medication Management   Patient Active Problem List   Diagnosis Date Noted  . Schizoaffective disorder, bipolar type 03/17/2011    Priority: High She feels much more stable after adding risperdal even at .25mg  Found one of her old Rx and was for 3mg  She requests incr to .5 This has enabled continued work, no use of cocaine, better relat with partner, no uncontrolled thoughts Ativan very helpful for her anxiety   . Attention deficit disorder meds have really helped work No side effects 05/01/2014  . Chronic back pain Has been to Dr Oneita Jolly, and further workup pending 03/05/2013  . BMI 27.0-27.9,adult 03/05/2013  . Cocaine abuse 03/17/2011  . Cannabis abuse 03/17/2011  . TOBACCO USER 10/13/2008   Outpatient Encounter Prescriptions as of 09/19/2014  Medication Sig  . amphetamine-dextroamphetamine (ADDERALL) 10 MG tablet Take 1 tablet (10 mg total) by mouth 2 (two) times daily. To replace stolen meds  . LamoTRIgine (LAMICTAL PO)  she discontinued this because she can go longer afford it   . LORazepam (ATIVAN) 2 MG tablet Take 1 tablet (2 mg total) by mouth every 8 (eight) hours as needed for anxiety.  . meloxicam (MOBIC) 15 MG tablet Take 1 tablet (15 mg total) by mouth daily.  . norgestimate-ethinyl estradiol (ORTHO-CYCLEN, 28,) 0.25-35 MG-MCG tablet Take 1 tablet by mouth daily.  Marland Kitchen oxyCODONE-acetaminophen (ROXICET) 5-325 MG per tablet Take 1 tablet by mouth at bedtime as needed for severe pain.  Marland Kitchen risperiDONE (RISPERDAL) 0.25 MG tablet Take 1 tablet (0.25 mg total) by mouth at bedtime.   No facility-administered encounter medications on file as of 09/19/2014.    Still feels a bit fragile in her ongoing attempt to reclaim her life  After years of trouble  ROS No weight loss No headaches or vision changes No chest pain or palpitations No dyspnea on exertion No GI complaints Sleep is improving No suicide ideation  Exam BP  110/88 mmHg  Pulse 86  Temp(Src) 98.6 F (37 C)  Resp 16  Ht 5\' 11"  (1.803 m)  Wt 155 lb (70.308 kg)  BMI 21.63 kg/m2  LMP 08/19/2014 HEENT clear Heart regular Mood is good today with coherent Sentences and orientation toward the future   Imp Schizoaffective disorder, bipolar type  Chronic back pain  Attention deficit disorder  Upcoming appointment for evaluation medications with Dr. Jasmine Awe Continued orthopedic evaluation to look for another answer for her chronic back pain other than chronic pain medication We discussed counseling-names given--She has been to AA meetings in the last month  Meds ordered this encounter  Medications  . amphetamine-dextroamphetamine (ADDERALL) 10 MG tablet    Sig: Take 1 tablet (10 mg total) by mouth 2 (two) times daily. This prescription was written on paper during computer outage 8/24    Dispense:  60 tablet    Refill:  0  . LORazepam (ATIVAN) 2 MG tablet    Sig: Take 1 tablet (2 mg total) by mouth every 8 (eight) hours as needed for anxiety. This prescription was written on paper on 09/19/2014 with no refills during a computer outage    Dispense:  90 tablet    Refill:  0  . risperiDONE (RISPERDAL) 0.5 MG tablet    Sig: Take 1 tablet (0.5 mg total) by mouth at bedtime. Written on paper 09/19/2014 during computer outage    Dispense:  30 tablet    Refill:  1  . oxyCODONE-acetaminophen (ROXICET) 5-325 MG per tablet  Sig: Take 1 tablet by mouth at bedtime as needed for severe pain. 2 follow-up prescription. Was written 8/ 24/16 on paper during computer outage    Dispense:  30 tablet    Refill:  0  . meloxicam (MOBIC) 15 MG tablet    Sig: Take 1 tablet (15 mg total) by mouth daily. Written on paper a 2416 during computer outage    Dispense:  30 tablet    Refill:  0   Follow-up one month

## 2014-09-24 ENCOUNTER — Other Ambulatory Visit: Payer: Self-pay

## 2014-09-24 MED ORDER — RISPERIDONE 0.5 MG PO TABS: 0.5000 mg | ORAL_TABLET | Freq: Every day | ORAL | Status: DC

## 2014-09-24 MED ORDER — MELOXICAM 15 MG PO TABS: 15.0000 mg | ORAL_TABLET | Freq: Every day | ORAL | Status: DC

## 2014-09-24 NOTE — Telephone Encounter (Signed)
Dr Sharrell Ku, Pharm sent reqs to change Rxs to 90 day supplies, but you had not originally written for that long. Pended.

## 2014-10-05 ENCOUNTER — Ambulatory Visit (HOSPITAL_COMMUNITY): Payer: Self-pay | Admitting: Psychiatry

## 2014-10-11 ENCOUNTER — Other Ambulatory Visit: Payer: Self-pay

## 2014-10-11 NOTE — Telephone Encounter (Signed)
Pt called and tried to schedule an appt with Dr. Merla Riches. She would like refills on her Adderall, oxycodone, and Ativan before Dr. Merla Riches goes on vacation. She will try to make it in to see him when he gets back. Please advise.

## 2014-10-12 MED ORDER — OXYCODONE-ACETAMINOPHEN 5-325 MG PO TABS: 1.0000 | ORAL_TABLET | Freq: Every evening | ORAL | Status: DC | PRN

## 2014-10-12 MED ORDER — LORAZEPAM 2 MG PO TABS: 2.0000 mg | ORAL_TABLET | Freq: Three times a day (TID) | ORAL | Status: DC | PRN

## 2014-10-12 MED ORDER — AMPHETAMINE-DEXTROAMPHETAMINE 10 MG PO TABS: 10.0000 mg | ORAL_TABLET | Freq: Two times a day (BID) | ORAL | Status: DC

## 2014-10-12 NOTE — Telephone Encounter (Signed)
Meds ordered this encounter  Medications  . LORazepam (ATIVAN) 2 MG tablet    Sig: Take 1 tablet (2 mg total) by mouth every 8 (eight) hours as needed for anxiety.    Dispense:  90 tablet    Refill:  0  . amphetamine-dextroamphetamine (ADDERALL) 10 MG tablet    Sig: Take 1 tablet (10 mg total) by mouth 2 (two) times daily.    Dispense:  60 tablet    Refill:  0  . oxyCODONE-acetaminophen (ROXICET) 5-325 MG per tablet    Sig: Take 1 tablet by mouth at bedtime as needed for severe pain.    Dispense:  30 tablet    Refill:  0

## 2014-10-12 NOTE — Telephone Encounter (Signed)
Please reprint prescriptions and sign. Status of prescriptions says "No Print." Thanks

## 2014-10-13 MED ORDER — LORAZEPAM 2 MG PO TABS: 2.0000 mg | ORAL_TABLET | Freq: Three times a day (TID) | ORAL | Status: DC | PRN

## 2014-10-13 MED ORDER — AMPHETAMINE-DEXTROAMPHETAMINE 10 MG PO TABS: 10.0000 mg | ORAL_TABLET | Freq: Two times a day (BID) | ORAL | Status: DC

## 2014-10-13 MED ORDER — OXYCODONE-ACETAMINOPHEN 5-325 MG PO TABS: 1.0000 | ORAL_TABLET | Freq: Every evening | ORAL | Status: DC | PRN

## 2014-10-13 NOTE — Telephone Encounter (Signed)
i'm gone for the next week--would you arrange for todd(or another PA working this weekend) to sign these pended rx for her

## 2014-10-15 NOTE — Telephone Encounter (Signed)
Tried to call pt to notify ready, but VM is full.

## 2014-11-12 ENCOUNTER — Other Ambulatory Visit: Payer: Self-pay

## 2014-11-12 MED ORDER — OXYCODONE-ACETAMINOPHEN 5-325 MG PO TABS: 1.0000 | ORAL_TABLET | Freq: Every evening | ORAL | Status: DC | PRN

## 2014-11-12 MED ORDER — LORAZEPAM 2 MG PO TABS: 2.0000 mg | ORAL_TABLET | Freq: Three times a day (TID) | ORAL | Status: DC | PRN

## 2014-11-12 MED ORDER — MELOXICAM 15 MG PO TABS: 15.0000 mg | ORAL_TABLET | Freq: Every day | ORAL | Status: DC

## 2014-11-12 MED ORDER — AMPHETAMINE-DEXTROAMPHETAMINE 10 MG PO TABS: 10.0000 mg | ORAL_TABLET | Freq: Two times a day (BID) | ORAL | Status: DC

## 2014-11-12 NOTE — Telephone Encounter (Signed)
Pt request refills of all meds,Percocet,adderall,Roxicet,etc   Best phone for pt is [phone]

## 2014-11-12 NOTE — Telephone Encounter (Signed)
Meds ordered this encounter  Medications  . amphetamine-dextroamphetamine (ADDERALL) 10 MG tablet    Sig: Take 1 tablet (10 mg total) by mouth 2 (two) times daily.    Dispense:  60 tablet    Refill:  0  . LORazepam (ATIVAN) 2 MG tablet    Sig: Take 1 tablet (2 mg total) by mouth every 8 (eight) hours as needed for anxiety.    Dispense:  90 tablet    Refill:  0  . meloxicam (MOBIC) 15 MG tablet    Sig: Take 1 tablet (15 mg total) by mouth daily.    Dispense:  90 tablet    Refill:  0  . oxyCODONE-acetaminophen (ROXICET) 5-325 MG tablet    Sig: Take 1 tablet by mouth at bedtime as needed for severe pain.    Dispense:  30 tablet    Refill:  0

## 2014-11-13 MED ORDER — RISPERIDONE 0.5 MG PO TABS: 0.5000 mg | ORAL_TABLET | Freq: Every day | ORAL | Status: DC

## 2014-11-13 NOTE — Telephone Encounter (Signed)
Notified pt RFs that are ready, or already sent. Pt also req'd Risperdal. It looks like this is due for refill too. Advised pt I will send in now. Done.

## 2014-12-11 ENCOUNTER — Telehealth: Payer: Self-pay

## 2014-12-11 MED ORDER — LORAZEPAM 1 MG PO TABS: 1.0000 mg | ORAL_TABLET | Freq: Three times a day (TID) | ORAL | Status: DC | PRN

## 2014-12-11 MED ORDER — RISPERIDONE 0.5 MG PO TABS: 0.5000 mg | ORAL_TABLET | Freq: Every day | ORAL | Status: AC

## 2014-12-11 NOTE — Telephone Encounter (Signed)
Since off for awhile we will start back at lower dose and she can f/u in next few weeks to adjust Meds ordered this encounter  Medications  . risperiDONE (RISPERDAL) 0.5 MG tablet    Sig: Take 1 tablet (0.5 mg total) by mouth at bedtime.    Dispense:  90 tablet    Refill:  0  . LORazepam (ATIVAN) 1 MG tablet    Sig: Take 1 tablet (1 mg total) by mouth every 8 (eight) hours as needed for anxiety.    Dispense:  90 tablet    Refill:  0

## 2014-12-11 NOTE — Telephone Encounter (Signed)
DOOLITTLE - PT stopped taking her anxiety medicine and now she needs to restart them asap.  She came by, but you were not here today.  Can you please refill?  [phone]

## 2014-12-12 ENCOUNTER — Other Ambulatory Visit: Payer: Self-pay

## 2014-12-12 MED ORDER — OXYCODONE-ACETAMINOPHEN 5-325 MG PO TABS: 1.0000 | ORAL_TABLET | Freq: Every evening | ORAL | Status: DC | PRN

## 2014-12-12 NOTE — Telephone Encounter (Signed)
Patient states that she was just here and Dr. Merla Richesoolittle gave her a prescription for her anxiety but not her pain medicine.  Patient would like to see if Dr. Merla Richesoolittle can write her a prescription for her pain medicine and call her when its ready to pick-up.   Best#: [phone]

## 2014-12-12 NOTE — Telephone Encounter (Signed)
Meds ordered this encounter  Medications  . oxyCODONE-acetaminophen (ROXICET) 5-325 MG tablet    Sig: Take 1 tablet by mouth at bedtime as needed for severe pain.    Dispense:  30 tablet    Refill:  0   Needed this as well so cam,e to 104

## 2014-12-12 NOTE — Telephone Encounter (Signed)
Pt picked up both rx's today.  Dr. Merla Richesoolittle please complete your note so we can close message

## 2014-12-12 NOTE — Telephone Encounter (Signed)
Pt picked up.

## 2014-12-24 ENCOUNTER — Encounter: Payer: Self-pay | Admitting: Internal Medicine

## 2015-02-05 ENCOUNTER — Other Ambulatory Visit: Payer: Self-pay | Admitting: *Deleted

## 2015-02-05 DIAGNOSIS — Z09 Encounter for follow-up examination after completed treatment for conditions other than malignant neoplasm: Secondary | ICD-10-CM

## 2015-02-08 ENCOUNTER — Inpatient Hospital Stay: Admission: RE | Admit: 2015-02-08 | Payer: Self-pay | Source: Ambulatory Visit

## 2015-02-08 ENCOUNTER — Other Ambulatory Visit: Payer: Self-pay

## 2015-05-29 ENCOUNTER — Ambulatory Visit (INDEPENDENT_AMBULATORY_CARE_PROVIDER_SITE_OTHER): Payer: Commercial Managed Care - PPO | Admitting: Internal Medicine

## 2015-05-29 VITALS — BP 120/62 | HR 85 | Temp 97.6°F | Resp 18 | Ht 71.0 in | Wt 153.2 lb

## 2015-05-29 DIAGNOSIS — F988 Other specified behavioral and emotional disorders with onset usually occurring in childhood and adolescence: Secondary | ICD-10-CM

## 2015-05-29 DIAGNOSIS — F250 Schizoaffective disorder, bipolar type: Secondary | ICD-10-CM | POA: Diagnosis not present

## 2015-05-29 DIAGNOSIS — M545 Low back pain, unspecified: Secondary | ICD-10-CM

## 2015-05-29 DIAGNOSIS — F909 Attention-deficit hyperactivity disorder, unspecified type: Secondary | ICD-10-CM | POA: Diagnosis not present

## 2015-05-29 DIAGNOSIS — M549 Dorsalgia, unspecified: Secondary | ICD-10-CM

## 2015-05-29 DIAGNOSIS — M542 Cervicalgia: Secondary | ICD-10-CM | POA: Diagnosis not present

## 2015-05-29 DIAGNOSIS — G8929 Other chronic pain: Secondary | ICD-10-CM | POA: Diagnosis not present

## 2015-05-29 MED ORDER — HYDROXYZINE HCL 50 MG PO TABS: 50.0000 mg | ORAL_TABLET | Freq: Three times a day (TID) | ORAL | Status: DC | PRN

## 2015-05-29 MED ORDER — ALPRAZOLAM 0.5 MG PO TABS: 0.5000 mg | ORAL_TABLET | Freq: Two times a day (BID) | ORAL | Status: DC | PRN

## 2015-05-29 MED ORDER — CYCLOBENZAPRINE HCL 10 MG PO TABS
10.0000 mg | ORAL_TABLET | Freq: Every day | ORAL | Status: DC
Start: 2015-05-29 — End: 2015-07-20

## 2015-05-29 MED ORDER — MIRTAZAPINE 30 MG PO TABS: 30.0000 mg | ORAL_TABLET | Freq: Every day | ORAL | Status: DC

## 2015-05-29 MED ORDER — OXYCODONE-ACETAMINOPHEN 10-325 MG PO TABS: 1.0000 | ORAL_TABLET | Freq: Three times a day (TID) | ORAL | Status: DC | PRN

## 2015-05-29 MED ORDER — NORGESTIMATE-ETH ESTRADIOL 0.25-35 MG-MCG PO TABS: 1.0000 | ORAL_TABLET | Freq: Every day | ORAL | Status: DC

## 2015-05-29 NOTE — Progress Notes (Addendum)
By signing my name below I, [name], attest that this documentation has been prepared under the direction and in the presence of Carolyn Sia, MD. Electonically Signed. [name], Scribe 05/29/2015 at 5:10 PM  Subjective:    Patient ID: [name], female    DOB: 10-26-1990, 25 y.o.   MRN: [id number]  Chief Complaint  Patient presents with  . new medication    would like to discuss restarting birth control, being placed on anxiety, depression, and something for sleep  . medication overuse    Pt has been taking 12 Tylenol PM per night to sleep , pt trying to wean off medication   . PHQ-9    Completed screening score 9  This is her first office visit since 09/19/2014 at which point she was stable on Risperdal and was awaiting evaluation by psychiatry and orthopedics are chronic problems. She was also given names of counselors and had been attending AA.  HPI  [name] is a 25 y.o. female who returns to the [facility] complaining of anxiety and depression. She has been off all of her medications for several months, including benzodiazepines. She is self-medicating with numerous signs.  Pt is taking 4 tylenol PM to control her anxiety and then 8 more before bed to help her sleep.   Pt has been trying to go to therapy again and saw one counselor 3 times and stopped going because she felt the therapist did not care about her. Pt is requesting a referral for a therapist and also a psychiatrist.  She continues in a very complicated lifestyle. She has been an occasional patient in our practice since her teen years. She has a long very complicated psychiatric history and social history. Her current relationship extremely stressful but more stable than most she's had.   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 Pt no longer has a phone or working car. Pt was suicidal for a week after her car stopped working but is not suicidal  currently. Once again is being supported by her mother and this part-time relationship.  Pt has not had any alcohol for the past 2 months. She has used marijuana and may have used cocaine although this is unclear.  [name]. Pt's boyfriend has stated that the pt needs to "get her act together" or he will move out and the pt is not sure if she wants to continue living with her boyfriend or not.  Pt also continues to complain of low back pain with radiation down her legs. Pt has difficulty walking up and down stairs due to the pain. Pt denies numbness, tingling, or weakness.  Sometimes the pain involves her neck and mid back as well. She would like pain medication until she can have a follow-up evaluation with orthopedics.  Pt's LNMP was 2 weeks ago. Pt wants to start birth control again.   Depression screen [facility] 2/9 05/29/2015 08/01/2014  Decreased Interest 2 0  Down, Depressed, Hopeless 1 0  PHQ - 2 Score 3 0  Altered sleeping 3 -  Tired, decreased energy 0 -  Change in appetite 0 -  Feeling bad or failure about yourself  0 -  Trouble concentrating 3 -  Moving slowly or fidgety/restless 0 -  Suicidal thoughts 0 -  PHQ-9 Score 9 -      Patient Active Problem List   Diagnosis Date Noted  . Attention deficit  disorder 05/01/2014  . Chronic back pain 03/05/2013  . BMI 27.0-27.9,adult 03/05/2013  . Schizoaffective disorder, bipolar type (HCC) 03/17/2011  . Cocaine abuse 03/17/2011  . Cannabis abuse 03/17/2011  . TOBACCO USER 10/13/2008  . BIPOLAR DISORDER UNSPECIFIED 06/24/2007   No current outpatient prescriptions on file.  No Known Allergies   Review of Systems  Constitutional: Negative for unexpected weight change.  Respiratory: Negative for shortness of breath.   Cardiovascular: Negative for chest pain and palpitations.  Gastrointestinal: Negative for abdominal pain.  Genitourinary: Negative for difficulty urinating.  Musculoskeletal:  Positive for back pain (low) and neck pain.  Neurological: Negative for numbness.  Psychiatric/Behavioral: Positive for sleep disturbance, dysphoric mood, decreased concentration and agitation. Negative for suicidal ideas, hallucinations and confusion. The patient is nervous/anxious and is hyperactive.        Objective:   Physical Exam  Constitutional: She is oriented to person, place, and time. She appears well-developed and well-nourished. No distress.  HENT:  Head: Normocephalic and atraumatic.  Eyes: Conjunctivae are normal. Pupils are equal, round, and reactive to light.  Neck: Normal range of motion. Neck supple.  Cardiovascular: Normal rate.   Pulmonary/Chest: Effort normal.  Musculoskeletal: Normal range of motion.  Pt has midline lumbar spine tenderness. Pt is tender over bilat trapezius muscles.   Neurological: She is alert and oriented to person, place, and time. No cranial nerve deficit or sensory deficit.  Reflex Scores:      Patellar reflexes are 2+ on the right side and 2+ on the left side.      Achilles reflexes are 2+ on the right side and 2+ on the left side. Pt has negative straight leg raise to 90 degrees bilat.  Skin: Skin is warm and dry.  Psychiatric:  She is anxious and has flights of ideas although no inappropriate thoughts. She has scattered thoughts which has been typical in the past. There is no paranoia. No current suicide ideation. Her depression is not interfering with this interaction.  Nursing note and vitals reviewed.    Filed Vitals:   05/29/15 1605  BP: 120/62  Pulse: 85  Temp: 97.6 F (36.4 C)  TempSrc: Oral  Resp: 18  Height: 5\' 11"  (1.803 m)  Weight: 153 lb 3.2 oz (69.491 kg)  SpO2: 97%      Assessment & Plan:   I have completed the patient encounter in its entirety as documented by the scribe, with editing by me where necessary. [name], M.D.  Call mom With referral information as the patient does not currently have  a phone =[phone]  Midline low back pain without sciatica - Plan: Ambulatory referral to Orthopedic Surgery  Neck pain - Plan: Ambulatory referral to Orthopedic Surgery  Schizoaffective disorder, bipolar type (HCC) - Plan: Ambulatory referral to Psychiatry  Chronic back pain  Attention deficit disorder  Meds ordered this encounter  Medications  . hydrOXYzine (ATARAX/VISTARIL) 50 MG tablet    Sig: Take 1 tablet (50 mg total) by mouth 3 (three) times daily as needed.For anxiety     Dispense:  30 tablet    Refill:  0  . cyclobenzaprine (FLEXERIL) 10 MG tablet    Sig: Take 1 tablet (10 mg total) by mouth at bedtime.    Dispense:  30 tablet    Refill:  0  . mirtazapine (REMERON) 30 MG tablet---She did not want to restart an atypical antipsychotic because she hates the way it makes her feel //Remeron should help with sleep as well as  depression and psychiatric referral can determine other medications     Sig: Take 1 tablet (30 mg total) by mouth at bedtime.    Dispense:  30 tablet    Refill:  0  . ALPRAZolam (XANAX) 0.5 MG tablet    Sig: Take 1 tablet (0.5 mg total) by mouth 2 (two) times daily as needed for anxiety.    Dispense:  30 tablet    Refill:  3  . norgestimate-ethinyl estradiol (ORTHO-CYCLEN, 28,) 0.25-35 MG-MCG tablet    Sig: Take 1 tablet by mouth daily.    Dispense:  1 Package    Refill:  11  . oxyCODONE-acetaminophen (PERCOCET) 10-325 MG tablet    Sig: Take 1 tablet by mouth every 8 (eight) hours as needed for pain. She will use this until her orthopedic evaluation     Dispense:  25 tablet    Refill:  0   Follow-up here in 3-4 weeks if not firmly establish with all referrals It is not yet clear who she will see here when I retire although over the years she has seen everyone in our practice.

## 2015-05-29 NOTE — Patient Instructions (Addendum)
Counseling: at [facility]--Andy Mitchum At [facility]--Jane Perrin [facility]---[facility]--[facility] [place]   IF you received an x-ray today, you will receive an invoice from [facility]. Please contact [facility] at [phone] with questions or concerns regarding your invoice.   IF you received labwork today, you will receive an invoice from [facility]. Please contact [facility] at [phone] with questions or concerns regarding your invoice.   Our billing staff will not be able to assist you with questions regarding bills from these companies.  You will be contacted with the lab results as soon as they are available. The fastest way to get your results is to activate your My Chart account. Instructions are located on the last page of this paperwork. If you have not heard from us regarding the results in 2 weeks, please contact this office.

## 2015-07-17 ENCOUNTER — Other Ambulatory Visit: Payer: Self-pay | Admitting: Internal Medicine

## 2015-07-19 ENCOUNTER — Encounter (HOSPITAL_COMMUNITY): Payer: Self-pay | Admitting: Emergency Medicine

## 2015-07-19 ENCOUNTER — Emergency Department (HOSPITAL_COMMUNITY)
Admission: EM | Admit: 2015-07-19 | Discharge: 2015-07-20 | Payer: Commercial Managed Care - PPO | Attending: Emergency Medicine | Admitting: Emergency Medicine

## 2015-07-19 DIAGNOSIS — F1910 Other psychoactive substance abuse, uncomplicated: Secondary | ICD-10-CM | POA: Diagnosis not present

## 2015-07-19 DIAGNOSIS — F1490 Cocaine use, unspecified, uncomplicated: Secondary | ICD-10-CM | POA: Diagnosis not present

## 2015-07-19 DIAGNOSIS — R45850 Homicidal ideations: Secondary | ICD-10-CM | POA: Diagnosis not present

## 2015-07-19 DIAGNOSIS — F29 Unspecified psychosis not due to a substance or known physiological condition: Secondary | ICD-10-CM | POA: Diagnosis not present

## 2015-07-19 DIAGNOSIS — R45851 Suicidal ideations: Secondary | ICD-10-CM | POA: Diagnosis not present

## 2015-07-19 DIAGNOSIS — F329 Major depressive disorder, single episode, unspecified: Secondary | ICD-10-CM | POA: Diagnosis not present

## 2015-07-19 DIAGNOSIS — F23 Brief psychotic disorder: Secondary | ICD-10-CM

## 2015-07-19 DIAGNOSIS — F1290 Cannabis use, unspecified, uncomplicated: Secondary | ICD-10-CM | POA: Diagnosis not present

## 2015-07-19 DIAGNOSIS — R4689 Other symptoms and signs involving appearance and behavior: Secondary | ICD-10-CM | POA: Diagnosis present

## 2015-07-19 DIAGNOSIS — F17210 Nicotine dependence, cigarettes, uncomplicated: Secondary | ICD-10-CM | POA: Insufficient documentation

## 2015-07-19 DIAGNOSIS — F319 Bipolar disorder, unspecified: Secondary | ICD-10-CM

## 2015-07-19 LAB — COMPREHENSIVE METABOLIC PANEL
ALT: 18 U/L (ref 14–54)
ANION GAP: 11 (ref 5–15)
AST: 27 U/L (ref 15–41)
Albumin: 4.7 g/dL (ref 3.5–5.0)
Alkaline Phosphatase: 46 U/L (ref 38–126)
BUN: 12 mg/dL (ref 6–20)
CALCIUM: 9.1 mg/dL (ref 8.9–10.3)
CHLORIDE: 106 mmol/L (ref 101–111)
CO2: 20 mmol/L — AB (ref 22–32)
CREATININE: 0.68 mg/dL (ref 0.44–1.00)
Glucose, Bld: 98 mg/dL (ref 65–99)
Potassium: 3.9 mmol/L (ref 3.5–5.1)
SODIUM: 137 mmol/L (ref 135–145)
Total Bilirubin: 0.9 mg/dL (ref 0.3–1.2)
Total Protein: 7.9 g/dL (ref 6.5–8.1)

## 2015-07-19 LAB — CBC
HCT: 38.2 % (ref 36.0–46.0)
HEMOGLOBIN: 13.6 g/dL (ref 12.0–15.0)
MCH: 31.8 pg (ref 26.0–34.0)
MCHC: 35.6 g/dL (ref 30.0–36.0)
MCV: 89.3 fL (ref 78.0–100.0)
Platelets: 298 10*3/uL (ref 150–400)
RBC: 4.28 MIL/uL (ref 3.87–5.11)
RDW: 13.5 % (ref 11.5–15.5)
WBC: 9.8 10*3/uL (ref 4.0–10.5)

## 2015-07-19 LAB — ETHANOL

## 2015-07-19 LAB — PREGNANCY, URINE: Preg Test, Ur: NEGATIVE

## 2015-07-19 LAB — SALICYLATE LEVEL: Salicylate Lvl: <4 mg/dL (ref 2.8–30.0)

## 2015-07-19 LAB — ACETAMINOPHEN LEVEL: Acetaminophen (Tylenol), Serum: <10 ug/mL — ABNORMAL LOW (ref 10–30)

## 2015-07-19 MED ORDER — LORAZEPAM 1 MG PO TABS
2.0000 mg | ORAL_TABLET | Freq: Once | ORAL | Status: AC
  Administered 2015-07-19: 2 mg via ORAL
  Filled 2015-07-19: qty 2

## 2015-07-19 MED ORDER — ZIPRASIDONE MESYLATE 20 MG IM SOLR
10.0000 mg | Freq: Once | INTRAMUSCULAR | Status: AC
  Administered 2015-07-19: 10 mg via INTRAMUSCULAR

## 2015-07-19 MED ORDER — ZIPRASIDONE MESYLATE 20 MG IM SOLR
INTRAMUSCULAR | Status: AC
  Administered 2015-07-19: 10 mg via INTRAMUSCULAR
  Filled 2015-07-19: qty 20

## 2015-07-19 MED ORDER — LORAZEPAM 2 MG/ML IJ SOLN
2.0000 mg | Freq: Once | INTRAMUSCULAR | Status: AC
Start: 2015-07-19 — End: 2015-07-19

## 2015-07-19 MED ORDER — TRAZODONE HCL 100 MG PO TABS
100.0000 mg | ORAL_TABLET | Freq: Every day | ORAL | Status: DC
  Administered 2015-07-19: 100 mg via ORAL
  Filled 2015-07-19: qty 1

## 2015-07-19 MED ORDER — STERILE WATER FOR INJECTION IJ SOLN
INTRAMUSCULAR | Status: AC
  Filled 2015-07-19: qty 10

## 2015-07-19 MED ORDER — NICOTINE 14 MG/24HR TD PT24
14.0000 mg | MEDICATED_PATCH | Freq: Every day | TRANSDERMAL | Status: DC
  Administered 2015-07-19 – 2015-07-20 (×2): 14 mg via TRANSDERMAL
  Filled 2015-07-19 (×2): qty 1

## 2015-07-19 MED ORDER — OLANZAPINE 10 MG PO TBDP
10.0000 mg | ORAL_TABLET | Freq: Once | ORAL | Status: AC
  Administered 2015-07-19: 10 mg via ORAL
  Filled 2015-07-19: qty 1
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
## 2015-07-19 NOTE — ED Notes (Signed)
[facility]. Patient alert, warm and dry, in no acute distress. Patient denies SI, HI, AVH and pain. Pt. Is pacing around unit talking loudly cursing and responding to internal stimuli. Patient made aware of Q15 minute rounds and security cameras for their safety. Patient instructed to come to me with needs or concerns.

## 2015-07-19 NOTE — ED Notes (Signed)
Patient noted in hall pacing talking loudly intermittently cursing requiring frequent redirection. No complaints, stable, in no acute distress. Q15 minute rounds and monitoring via [employer] to continue.

## 2015-07-19 NOTE — ED Notes (Addendum)
Pt up in hallway.  She is pacing hallway and somewhat intrusive to another  Female patient.  She was instructed to give a urine sample.  She is very manic.

## 2015-07-19 NOTE — ED Notes (Signed)
Pt. Made aware of the need for a urine sample. 

## 2015-07-19 NOTE — ED Notes (Signed)
Pt. At nurses station window staring at staff and cursing.

## 2015-07-19 NOTE — Progress Notes (Signed)
CSW received call from [place] [facility]. She states that the patient has been accepted to the facility and is welcomed to come now.  CSW made nurse aware.  Accepting Physician: Dr. Neville RouteMario Sanchez Nurse Report Number: [phone]  Trish MageBrittney Kobie Whidby, LCSWA [phone] ED CSW 07/19/2015 5:44 PM

## 2015-07-19 NOTE — BH Assessment (Signed)
BHH Assessment Progress Note  The following facilities have been contacted to seek placement for this pt, with results as noted:  Beds available, information sent, decision pending:  [facility]   At capacity:  [facility]   Jalasia Eskridge, [place] Triage Specialist [phone]

## 2015-07-19 NOTE — ED Notes (Signed)
Patient no longer in violent restraints.  Patient ambulatory to bathroom without difficulty and alert and oriented.  Patient back to bed-lying in bed with eyes closed at present in NAD.  Patient currently cooperative with staff.

## 2015-07-19 NOTE — ED Notes (Signed)
Pt. Pressing staff call button in her room right after walking away from nurses desk. Alarm reset and patient redirected.

## 2015-07-19 NOTE — ED Notes (Signed)
Pt resting quietly in room.  Arouses easily but appears groggy and confused.  Pt signed for her belongings and fell beck asleep.  15 minute checks and video monitoring in place.

## 2015-07-19 NOTE — ED Notes (Addendum)
Geodon given, patient began to calm down once dressed out, placed in restraints by security, and family was removed. During the outburst patient was screaming very loudly "Lets go, Lets go, Lets go" to her family members. Once IVC'd by the MD, patient attempted to leave, was stopped by security, and began to get aggressive with the security team. Placed in violent restraints during outburst. Will re-evaluate need for restraints shortly.

## 2015-07-19 NOTE — ED Notes (Signed)
Patient noted in hall pacing, talking to herself with intermittent cursing requiring frequent redirection. Stable, in no acute distress. Q15 minute rounds and monitoring via [employer] to continue. Pt. Instructed to provide a urine sample again.

## 2015-07-19 NOTE — ED Notes (Signed)
Bed: WBH36 Expected date:  Expected time:  Means of arrival:  Comments: Room 13 
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
## 2015-07-19 NOTE — ED Notes (Signed)
Pt became agitated, irrational, screaming at the top of her lungs at her family.  Family escorted to lobby.  Pt IVC'ed by Dr. Radford PaxBeaton.  10 mg geodon given in triage.
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
## 2015-07-19 NOTE — ED Notes (Addendum)
Pt accompanied by mother and family. Hx of psychiatric issues, patient is excessively loud, easily distracted, making inappropriate facial expressions and grandeur gestures. Mother is concerned that she's manic, states she hasn't slept in days and isn't eating. Denies thoughts of wanting to hurt herself or anyone else.  Mother states [facility] but walked out of there last night and [place] [facility] [place] last night. States the patient is mentioning seeing things that aren't there.

## 2015-07-20 MED ORDER — QUETIAPINE FUMARATE ER 50 MG PO TB24
50.0000 mg | ORAL_TABLET | Freq: Once | ORAL | Status: AC
  Administered 2015-07-20: 50 mg via ORAL
  Filled 2015-07-20: qty 1

## 2015-07-20 MED ORDER — TRAZODONE HCL 100 MG PO TABS: 100.0000 mg | ORAL_TABLET | Freq: Every day | ORAL | Status: DC

## 2015-07-20 MED ORDER — HYDROXYZINE HCL 25 MG PO TABS: 50.0000 mg | ORAL_TABLET | Freq: Once | ORAL | Status: DC

## 2015-07-20 NOTE — ED Notes (Signed)
Report called to Shyrl NumbersKim Lua RN at [facility], awaiting transportation.

## 2015-07-20 NOTE — ED Notes (Addendum)
Patient pacing halls and hyperverbal with pressured speech. Pleasant until talking about wanting to be discharged and then verbally abusive with profanity and threats, to no specific person. Expresses anger toward mother who IVC'd patient and states she wants to discharge home. Poor insight and judgement.

## 2015-07-20 NOTE — ED Notes (Signed)
Sheriff is here to transport 

## 2015-07-20 NOTE — ED Notes (Signed)
Patient noted sleeping in room. No complaints, stable, in no acute distress. Q15 minute rounds and monitoring via Security Cameras to continue.  

## 2015-07-20 NOTE — BHH Counselor (Signed)
Received a telephone call from [place] at [facility] in [place], [place] [phone]).  Trula OreChristina reports the Patient's IVC exam paperwork is missing the number of days for the Patient to be hospitalized.  She reports Dr. Radford PaxBeaton can call and they will accept his verbal statement of the number of days the Patient  should be hospitalized.  Trula OreChristina reports she will be leaving for today, however Denny Peonrin is familiar and that Dr. Radford PaxBeaton can speak with her.  Check with ED Secretary to see when the next day Dr. Radford PaxBeaton will be working.

## 2015-07-20 NOTE — ED Notes (Addendum)
[facility] [place] [facility], pleasant, cooperative, ambulatory with stable vital signs. IVC, EMTALA, AVS, Face Sheet, Transfer report, MAR report sent with patient. [facility]Victorino [facility]Jennifer at  [facility] notified that patient is in transit.

## 2015-07-20 NOTE — ED Notes (Signed)
Belongings given to sheriff. 

## 2015-07-20 NOTE — ED Notes (Addendum)
Pt up in the hall cursing, yelling about breakfast.  Pt is aware that she is going to be transferred to [facility] this morning.  Pt denies si/hi/avh at this time, reports that she was [place] [facility] last month and put on seroquel to help her sleep, but ran out.  Pt is wanting to leave and is aware that she is under IVC.

## 2015-07-20 NOTE — ED Notes (Signed)
Pt's boy friend into see 

## 2015-07-20 NOTE — ED Notes (Signed)
Patient noted in room. No complaints, stable, in no acute distress. Q15 minute rounds and monitoring via Security Cameras to continue.  

## 2015-09-24 ENCOUNTER — Encounter (HOSPITAL_COMMUNITY): Payer: Self-pay | Admitting: *Deleted

## 2015-09-24 ENCOUNTER — Emergency Department (HOSPITAL_COMMUNITY)
Admission: EM | Admit: 2015-09-24 | Discharge: 2015-09-24 | Disposition: A | Discharge status: Other Acute Inpatient Hospital | Payer: Commercial Managed Care - PPO | Attending: Emergency Medicine | Admitting: Emergency Medicine

## 2015-09-24 ENCOUNTER — Encounter (HOSPITAL_COMMUNITY): Payer: Self-pay | Admitting: Emergency Medicine

## 2015-09-24 ENCOUNTER — Inpatient Hospital Stay (HOSPITAL_COMMUNITY)
Admission: AD | Admit: 2015-09-24 | Discharge: 2015-09-30 | DRG: 885 | Disposition: A | Discharge status: Home / Self Care | Payer: Commercial Managed Care - PPO | Attending: Psychiatry | Admitting: Psychiatry

## 2015-09-24 DIAGNOSIS — F19950 Other psychoactive substance use, unspecified with psychoactive substance-induced psychotic disorder with delusions: Secondary | ICD-10-CM | POA: Diagnosis not present

## 2015-09-24 DIAGNOSIS — F1120 Opioid dependence, uncomplicated: Secondary | ICD-10-CM | POA: Diagnosis present

## 2015-09-24 DIAGNOSIS — F1410 Cocaine abuse, uncomplicated: Secondary | ICD-10-CM | POA: Diagnosis present

## 2015-09-24 DIAGNOSIS — F1920 Other psychoactive substance dependence, uncomplicated: Secondary | ICD-10-CM | POA: Diagnosis not present

## 2015-09-24 DIAGNOSIS — F3110 Bipolar disorder, current episode manic without psychotic features, unspecified: Secondary | ICD-10-CM | POA: Diagnosis present

## 2015-09-24 DIAGNOSIS — Z79899 Other long term (current) drug therapy: Secondary | ICD-10-CM | POA: Diagnosis not present

## 2015-09-24 DIAGNOSIS — F17210 Nicotine dependence, cigarettes, uncomplicated: Secondary | ICD-10-CM | POA: Insufficient documentation

## 2015-09-24 DIAGNOSIS — F17200 Nicotine dependence, unspecified, uncomplicated: Secondary | ICD-10-CM | POA: Clinically undetermined

## 2015-09-24 DIAGNOSIS — S39012A Strain of muscle, fascia and tendon of lower back, initial encounter: Secondary | ICD-10-CM

## 2015-09-24 DIAGNOSIS — F1220 Cannabis dependence, uncomplicated: Secondary | ICD-10-CM | POA: Diagnosis present

## 2015-09-24 DIAGNOSIS — F909 Attention-deficit hyperactivity disorder, unspecified type: Secondary | ICD-10-CM | POA: Insufficient documentation

## 2015-09-24 DIAGNOSIS — F3113 Bipolar disorder, current episode manic without psychotic features, severe: Secondary | ICD-10-CM | POA: Diagnosis present

## 2015-09-24 DIAGNOSIS — R440 Auditory hallucinations: Secondary | ICD-10-CM | POA: Diagnosis present

## 2015-09-24 DIAGNOSIS — F1910 Other psychoactive substance abuse, uncomplicated: Secondary | ICD-10-CM | POA: Insufficient documentation

## 2015-09-24 HISTORY — DX: Bipolar disorder, unspecified: F31.9

## 2015-09-24 HISTORY — DX: Other problems related to lifestyle: Z72.89

## 2015-09-24 LAB — I-STAT BETA HCG BLOOD, ED (MC, WL, AP ONLY): I-stat hCG, quantitative: <5 m[IU]/mL (ref <5)

## 2015-09-24 LAB — RAPID URINE DRUG SCREEN, HOSP PERFORMED
AMPHETAMINES: NOT DETECTED
BARBITURATES: NOT DETECTED
Benzodiazepines: NOT DETECTED
Cocaine: POSITIVE — AB
Opiates: POSITIVE — AB
TETRAHYDROCANNABINOL: POSITIVE — AB

## 2015-09-24 LAB — CBC WITH DIFFERENTIAL/PLATELET
Basophils Absolute: 0 10*3/uL (ref 0.0–0.1)
Basophils Relative: 0 %
EOS ABS: 0.2 10*3/uL (ref 0.0–0.7)
EOS PCT: 2 %
HEMATOCRIT: 40.5 % (ref 36.0–46.0)
HEMOGLOBIN: 13.7 g/dL (ref 12.0–15.0)
LYMPHS ABS: 3.1 10*3/uL (ref 0.7–4.0)
LYMPHS PCT: 39 %
MCH: 31 pg (ref 26.0–34.0)
MCHC: 33.8 g/dL (ref 30.0–36.0)
MCV: 91.6 fL (ref 78.0–100.0)
MONO ABS: 0.7 10*3/uL (ref 0.1–1.0)
Monocytes Relative: 9 %
Neutro Abs: 4 10*3/uL (ref 1.7–7.7)
Neutrophils Relative %: 50 %
Platelets: 252 10*3/uL (ref 150–400)
RBC: 4.42 MIL/uL (ref 3.87–5.11)
RDW: 13.9 % (ref 11.5–15.5)
WBC: 7.9 10*3/uL (ref 4.0–10.5)

## 2015-09-24 LAB — COMPREHENSIVE METABOLIC PANEL
ALBUMIN: 4.5 g/dL (ref 3.5–5.0)
ALK PHOS: 38 U/L (ref 38–126)
ALT: 16 U/L (ref 14–54)
AST: 24 U/L (ref 15–41)
Anion gap: 7 (ref 5–15)
BILIRUBIN TOTAL: 0.7 mg/dL (ref 0.3–1.2)
BUN: 12 mg/dL (ref 6–20)
CALCIUM: 9.2 mg/dL (ref 8.9–10.3)
CO2: 22 mmol/L (ref 22–32)
CREATININE: 0.77 mg/dL (ref 0.44–1.00)
Chloride: 112 mmol/L — ABNORMAL HIGH (ref 101–111)
GFR calc Af Amer: >60 mL/min (ref >60)
GFR calc non Af Amer: >60 mL/min (ref >60)
GLUCOSE: 101 mg/dL — AB (ref 65–99)
Potassium: 3.5 mmol/L (ref 3.5–5.1)
Sodium: 141 mmol/L (ref 135–145)
TOTAL PROTEIN: 7.4 g/dL (ref 6.5–8.1)

## 2015-09-24 LAB — I-STAT CHEM 8, ED
BUN: 11 mg/dL (ref 6–20)
CALCIUM ION: 1.16 mmol/L (ref 1.15–1.40)
CREATININE: 0.8 mg/dL (ref 0.44–1.00)
Chloride: 107 mmol/L (ref 101–111)
GLUCOSE: 98 mg/dL (ref 65–99)
HEMATOCRIT: 43 % (ref 36.0–46.0)
HEMOGLOBIN: 14.6 g/dL (ref 12.0–15.0)
Potassium: 3.5 mmol/L (ref 3.5–5.1)
Sodium: 144 mmol/L (ref 135–145)
TCO2: 23 mmol/L (ref 0–100)

## 2015-09-24 LAB — ETHANOL: ALCOHOL ETHYL (B): 17 mg/dL — AB (ref <5)

## 2015-09-24 LAB — SALICYLATE LEVEL

## 2015-09-24 LAB — ACETAMINOPHEN LEVEL: Acetaminophen (Tylenol), Serum: <10 ug/mL — ABNORMAL LOW (ref 10–30)

## 2015-09-24 MED ORDER — GABAPENTIN 300 MG PO CAPS
300.0000 mg | ORAL_CAPSULE | Freq: Three times a day (TID) | ORAL | Status: DC
  Administered 2015-09-24: 300 mg via ORAL
  Filled 2015-09-24 (×2): qty 1

## 2015-09-24 MED ORDER — LORAZEPAM 1 MG PO TABS: 1.0000 mg | ORAL_TABLET | Freq: Three times a day (TID) | ORAL | Status: DC | PRN

## 2015-09-24 MED ORDER — CLONIDINE HCL 0.1 MG PO TABS
0.1000 mg | ORAL_TABLET | Freq: Four times a day (QID) | ORAL | Status: AC
  Administered 2015-09-24 – 2015-09-25 (×6): 0.1 mg via ORAL
  Filled 2015-09-24 (×9): qty 1

## 2015-09-24 MED ORDER — ONDANSETRON HCL 4 MG PO TABS: 4.0000 mg | ORAL_TABLET | Freq: Three times a day (TID) | ORAL | Status: DC | PRN

## 2015-09-24 MED ORDER — LORAZEPAM 1 MG PO TABS
1.0000 mg | ORAL_TABLET | Freq: Three times a day (TID) | ORAL | Status: DC | PRN
  Administered 2015-09-24: 1 mg via ORAL
  Filled 2015-09-24: qty 1

## 2015-09-24 MED ORDER — ZIPRASIDONE MESYLATE 20 MG IM SOLR: 20.0000 mg | INTRAMUSCULAR | Status: DC | PRN

## 2015-09-24 MED ORDER — DICYCLOMINE HCL 20 MG PO TABS: 20.0000 mg | ORAL_TABLET | Freq: Four times a day (QID) | ORAL | Status: AC | PRN

## 2015-09-24 MED ORDER — MAGNESIUM HYDROXIDE 400 MG/5ML PO SUSP
30.0000 mL | Freq: Every day | ORAL | Status: DC | PRN
  Filled 2015-09-24: qty 30

## 2015-09-24 MED ORDER — ALUM & MAG HYDROXIDE-SIMETH 200-200-20 MG/5ML PO SUSP: 30.0000 mL | ORAL | Status: DC | PRN

## 2015-09-24 MED ORDER — HYDROXYZINE HCL 25 MG PO TABS
25.0000 mg | ORAL_TABLET | Freq: Four times a day (QID) | ORAL | Status: AC | PRN
Start: 2015-09-24 — End: 2015-09-29
  Administered 2015-09-25 – 2015-09-29 (×10): 25 mg via ORAL
  Filled 2015-09-24 (×12): qty 1

## 2015-09-24 MED ORDER — TRAZODONE HCL 50 MG PO TABS
50.0000 mg | ORAL_TABLET | Freq: Every evening | ORAL | Status: DC | PRN
  Administered 2015-09-24: 50 mg via ORAL
  Filled 2015-09-24: qty 1

## 2015-09-24 MED ORDER — NICOTINE 21 MG/24HR TD PT24
21.0000 mg | MEDICATED_PATCH | Freq: Every day | TRANSDERMAL | Status: DC
  Administered 2015-09-24: 21 mg via TRANSDERMAL
  Filled 2015-09-24: qty 1

## 2015-09-24 MED ORDER — LOPERAMIDE HCL 2 MG PO CAPS: 2.0000 mg | ORAL_CAPSULE | ORAL | Status: AC | PRN

## 2015-09-24 MED ORDER — HALOPERIDOL LACTATE 5 MG/ML IJ SOLN
5.0000 mg | Freq: Once | INTRAMUSCULAR | Status: AC
  Administered 2015-09-24: 5 mg via INTRAMUSCULAR
  Filled 2015-09-24: qty 1

## 2015-09-24 MED ORDER — METHOCARBAMOL 500 MG PO TABS
500.0000 mg | ORAL_TABLET | Freq: Three times a day (TID) | ORAL | Status: AC | PRN
  Administered 2015-09-26 – 2015-09-29 (×5): 500 mg via ORAL
  Filled 2015-09-24 (×5): qty 1

## 2015-09-24 MED ORDER — NAPROXEN 500 MG PO TABS: 500.0000 mg | ORAL_TABLET | Freq: Two times a day (BID) | ORAL | Status: DC | PRN

## 2015-09-24 MED ORDER — RISPERIDONE 2 MG PO TBDP
2.0000 mg | ORAL_TABLET | Freq: Three times a day (TID) | ORAL | Status: DC | PRN
  Administered 2015-09-24 – 2015-09-30 (×9): 2 mg via ORAL
  Filled 2015-09-24 (×10): qty 2
  Filled 2015-09-24: qty 1

## 2015-09-24 MED ORDER — ACETAMINOPHEN 325 MG PO TABS
650.0000 mg | ORAL_TABLET | ORAL | Status: DC | PRN
  Administered 2015-09-25 – 2015-09-26 (×3): 650 mg via ORAL
  Filled 2015-09-24 (×3): qty 2

## 2015-09-24 MED ORDER — LORAZEPAM 1 MG PO TABS
1.0000 mg | ORAL_TABLET | ORAL | Status: AC | PRN
  Administered 2015-09-24: 1 mg via ORAL
  Filled 2015-09-24: qty 1

## 2015-09-24 MED ORDER — TRAZODONE HCL 50 MG PO TABS: 50.0000 mg | ORAL_TABLET | Freq: Every evening | ORAL | Status: DC | PRN

## 2015-09-24 MED ORDER — ACETAMINOPHEN 325 MG PO TABS: 650.0000 mg | ORAL_TABLET | ORAL | Status: DC | PRN

## 2015-09-24 MED ORDER — IBUPROFEN 200 MG PO TABS
600.0000 mg | ORAL_TABLET | Freq: Three times a day (TID) | ORAL | Status: DC | PRN
  Administered 2015-09-25: 600 mg via ORAL
  Filled 2015-09-24: qty 1

## 2015-09-24 MED ORDER — NICOTINE POLACRILEX 2 MG MT GUM
2.0000 mg | CHEWING_GUM | OROMUCOSAL | Status: DC | PRN
  Administered 2015-09-24 – 2015-09-30 (×28): 2 mg via ORAL
  Filled 2015-09-24 (×5): qty 1
  Filled 2015-09-24: qty 10
  Filled 2015-09-24 (×2): qty 1

## 2015-09-24 MED ORDER — CLONIDINE HCL 0.1 MG PO TABS: 0.1000 mg | ORAL_TABLET | ORAL | Status: DC

## 2015-09-24 MED ORDER — ONDANSETRON 4 MG PO TBDP: 4.0000 mg | ORAL_TABLET | Freq: Four times a day (QID) | ORAL | Status: DC | PRN

## 2015-09-24 MED ORDER — HYDROXYZINE HCL 25 MG PO TABS: 25.0000 mg | ORAL_TABLET | Freq: Four times a day (QID) | ORAL | Status: DC | PRN

## 2015-09-24 MED ORDER — ONDANSETRON 4 MG PO TBDP: 4.0000 mg | ORAL_TABLET | Freq: Four times a day (QID) | ORAL | Status: AC | PRN

## 2015-09-24 MED ORDER — CLONIDINE HCL 0.1 MG PO TABS
0.1000 mg | ORAL_TABLET | Freq: Every day | ORAL | Status: AC
  Administered 2015-09-27 – 2015-09-29 (×3): 0.1 mg via ORAL
  Filled 2015-09-24 (×2): qty 1

## 2015-09-24 MED ORDER — CLONIDINE HCL 0.1 MG PO TABS
0.1000 mg | ORAL_TABLET | ORAL | Status: AC
  Administered 2015-09-26 – 2015-09-27 (×4): 0.1 mg via ORAL
  Filled 2015-09-24 (×6): qty 1

## 2015-09-24 MED ORDER — GABAPENTIN 100 MG PO CAPS: 200.0000 mg | ORAL_CAPSULE | Freq: Three times a day (TID) | ORAL | Status: DC

## 2015-09-24 MED ORDER — CLONIDINE HCL 0.1 MG PO TABS: 0.1000 mg | ORAL_TABLET | Freq: Every day | ORAL | Status: DC

## 2015-09-24 MED ORDER — IBUPROFEN 200 MG PO TABS: 600.0000 mg | ORAL_TABLET | Freq: Three times a day (TID) | ORAL | Status: DC | PRN

## 2015-09-24 MED ORDER — CLONIDINE HCL 0.1 MG PO TABS
0.1000 mg | ORAL_TABLET | Freq: Four times a day (QID) | ORAL | Status: DC
  Administered 2015-09-24 (×2): 0.1 mg via ORAL
  Filled 2015-09-24 (×2): qty 1

## 2015-09-24 MED ORDER — GABAPENTIN 300 MG PO CAPS
300.0000 mg | ORAL_CAPSULE | Freq: Three times a day (TID) | ORAL | Status: DC
  Administered 2015-09-24 – 2015-09-30 (×18): 300 mg via ORAL
  Filled 2015-09-24 (×27): qty 1

## 2015-09-24 MED ORDER — NICOTINE 21 MG/24HR TD PT24: 21.0000 mg | MEDICATED_PATCH | Freq: Every day | TRANSDERMAL | Status: DC

## 2015-09-24 MED ORDER — METHOCARBAMOL 500 MG PO TABS: 500.0000 mg | ORAL_TABLET | Freq: Three times a day (TID) | ORAL | Status: DC | PRN

## 2015-09-24 MED ORDER — LOPERAMIDE HCL 2 MG PO CAPS: 2.0000 mg | ORAL_CAPSULE | ORAL | Status: DC | PRN

## 2015-09-24 MED ORDER — DICYCLOMINE HCL 20 MG PO TABS: 20.0000 mg | ORAL_TABLET | Freq: Four times a day (QID) | ORAL | Status: DC | PRN

## 2015-09-24 NOTE — Progress Notes (Deleted)
Recreation Therapy Notes  Animal-Assisted Activity (AAA) Program Checklist/Progress Notes Patient Eligibility Criteria Checklist & Daily Group note for Rec TxIntervention  Date: 08.29.2017 Time: 2:45pm Location: 400 Hall Dayroom   AAA/T Program Assumption of Risk Form signed by Patient/ or Parent Legal Guardian Yes  Patient is free of allergies or sever asthma Yes  Patient reports no fear of animals Yes  Patient reports no history of cruelty to animals Yes  Patient understands his/her participation is voluntary Yes  Behavioral Response: Did not attend.   Rachelann Enloe L Zebbie Ace, LRT/CTRS  Ryin Schillo L 09/24/2015 2:56 PM 

## 2015-09-24 NOTE — BH Assessment (Addendum)
Tele Assessment Note   [name] is an 25 y.o. female who presents involuntarily unaccompanied BIB [employer] reporting symptoms of mania and bizarre behavior. Pt behavior is reported as agitated, delusional and hallucinating. Prior to ED visit, had been jumping off car windshields per mother. Pt has a history of Bipolar I, Schizoaffective, Bipolar type, ADHD, Anxiety and polysubstance abuse.  Pt denies all symptoms for depression and anxiety. No collaterals were available for comment.  Pt was a poor historian so, information was obtained from pt, noted and pt record. Pt states she has no current stressors.   Pt denies current suicidal ideation. Pt has made multiple attempts in the past. Pt denied homicidal ideation & a history of aggression or anger outbursts.Per pt record, pt has a hx of verbal, aggressive threats of harm to others including her mother and brother who she threatened to harm with a large hunting knife she purchased when she failed to buy a gun. Pt reports no legal history.  Pt endorses auditory but not visual hallucinations or other psychotic symptoms. Pt reports medication current prescribed by her PCP,  Dr. Merla Riches, but per her mother pt is not compliant. Pt currently sees no one for OPT and never has per pt record.   Pt lives with her boyfriend.  Per pt record, pt and BF were recently evicted due to pt's bizarre and erratic behavior. Pt sts supports include her mother and brother . Pt reports completing a GED. Pt has very poor insight and impaired judgment. Pt's memory seems impaired. Pt reports no history of physical, verbal, emotional or sexual abuse. Pt reports sleeping 6-8 hours each night although her mother reported in June that she was not sleeping much at all.  Pt sts she is eating regularly and well having no weight loss or gain recently.  ? Pt's treatment history includes no OP treatment per pt's pt record. IP history includes multiple psychiatric hospitalizations at [facility] and [facility]. Last admission was at at [facility] was in 2013. First hospitalization was at age 89 per her mother.  Pt admits to alcohol/recreational substance use including current regular use of cocaine, cannabis, heroin, nicotine (cigarettes) and alcohol. Pt's BAL was <5 and UDS was positive for opiates, cocaine and cannabis when tested for in the ED today.  ? MSE: Pt is dressed in scrubs. Pt seems euthymic . Pt was oriented 2 (Person, place) with rapid, pressured speech and exaggerated motor behavior. Eye contact is good. Pt's mood is stated as "fine" and affect appears cheerful.  Affect seems congruent with mood. Thought process is coherent and relevant, however,often, pt shifts into a flight of ideas. There is no indication pt is currently responding to internal stimuli but pr is experiencing delusional thought content. Pt was calm and cooperative throughout assessment. Pt is currently able to contract for safety outside the hospital, but given the amount and level of minimizing by the pt, her answer is questionable.     Diagnosis: Bipolar 1 by hx; ADHD by hx; Schizoaffective D/O by hx; Polysubstance abuse by hx  Past Medical History:  Past Medical History:  Diagnosis Date  . Anxiety   . Atrial septal defect    corrected 1999  . Bipolar disorder (HCC)   . Depression   . Heart murmur   . Mental health problem   . Self-mutilation   . Substance abuse     Past Surgical History:  Procedure Laterality Date  . ASD REPAIR  Family History:  Family History  Problem Relation Age of Onset  . Cancer Mother     breast - both  . Arthritis Maternal Grandmother     rheumatoid  . Cancer Maternal Grandfather     brain  . Alzheimer's disease Paternal Grandmother     Social History:  reports that she has been smoking Cigarettes.  She has a 14.00 pack-year smoking history. She does not have any smokeless tobacco history on file. She reports that she drinks alcohol. She reports that  she has current or past drug history, including Marijuana and Cocaine, about 3 times per week.  Additional Social History:  Alcohol / Drug Use Prescriptions: see MAR History of alcohol / drug use?: Yes Longest period of sobriety (when/how long): unknown Substance #1 Name of Substance 1: Cocaine 1 - Age of First Use: 18 1 - Amount (size/oz): unknown 1 - Frequency: 3 x week 1 - Duration: ongoing 1 - Last Use / Amount: this week  Substance #2 Name of Substance 2: Cannabis 2 - Age of First Use: 15 2 - Amount (size/oz): unknown 2 - Frequency: daily 2 - Duration: ongoing 2 - Last Use / Amount: yesterday Substance #3 Name of Substance 3: Heroin 3 - Age of First Use: 18 3 - Amount (size/oz): unknown 3 - Frequency: daily 3 - Duration: ongoing 3 - Last Use / Amount: yesterday Substance #4 Name of Substance 4: Nicotine/Cigarettes 4 - Age of First Use: 15 4 - Amount (size/oz): 1 1/2 packs 4 - Frequency: daily 4 - Duration: ongoing 4 - Last Use / Amount: yesterday Substance #5 Name of Substance 5: Alcohol 5 - Age of First Use: 15 5 - Amount (size/oz): 2 glasses of wine or liquor 5 - Frequency: 2 x week 5 - Duration: ongoing 5 - Last Use / Amount: yesterday  CIWA: CIWA-Ar BP: 127/79 Pulse Rate: 74 COWS:    PATIENT STRENGTHS: (choose at least two) Average or above average intelligence Communication skills Supportive family/friends  Allergies: No Known Allergies  Home Medications:  (Not in a hospital admission)  OB/GYN Status:  No LMP recorded (lmp unknown).  General Assessment Data Location of Assessment: [facility] ED TTS Assessment: In system Is this a Tele or Face-to-Face Assessment?: Tele Assessment Is this an Initial Assessment or a Re-assessment for this encounter?: Initial Assessment Marital status: Long term relationship Living Arrangements: Spouse/significant other Can pt return to current living arrangement?: Yes Admission Status: Involuntary (Mother  petitioned) Is patient capable of signing voluntary admission?: No Referral Source: Self/Family/Friend Insurance type:  [facility])  Medical Screening Exam [facility] Walk-in ONLY) Medical Exam completed: Yes  Crisis Care Plan Living Arrangements: Spouse/significant other Legal Guardian:  (self) Name of Psychiatrist:  (No psychiatrist- PCP Dr. Merla Riches prescribes meds) Name of Therapist:  (none)  Education Status Is patient currently in school?: No Highest grade of school patient has completed:  (GED)  Risk to self with the past 6 months Suicidal Ideation: No (Denies) Has patient been a risk to self within the past 6 months prior to admission? : No Suicidal Intent: No Has patient had any suicidal intent within the past 6 months prior to admission? : No Is patient at risk for suicide?: Yes (pt has tried previously & is manic & impulsive) Suicidal Plan?: No Has patient had any suicidal plan within the past 6 months prior to admission? : No Access to Means: No (sts no access to guns, weapons) What has been your use of drugs/alcohol within the last 12  months?:  (daily use) Previous Attempts/Gestures: Yes How many times?:  (multiple per mother) Other Self Harm Risks:  (cutting) Triggers for Past Attempts: Hallucinations, Unpredictable Intentional Self Injurious Behavior: Cutting (new cuts on upper thigh) Comment - Self Injurious Behavior:  (hx of cutting-pt denies) Family Suicide History: Unknown Recent stressful life event(s):  (pt denies any stressors) Persecutory voices/beliefs?: No Depression: No Depression Symptoms:  (Pt denies all symptoms) Substance abuse history and/or treatment for substance abuse?: Yes (left [facility] in June 2017) Suicide prevention information given to non-admitted patients: Not applicable  Risk to Others within the past 6 months Homicidal Ideation: No (denies) Does patient have any lifetime risk of violence toward others beyond the six months prior to admission? :  Yes (comment) (Threatens mom & brother; verbally abusive to family) Thoughts of Harm to Others: No (denies) Current Homicidal Intent: No Current Homicidal Plan: No Access to Homicidal Means: No Identified Victim:  (none noted) History of harm to others?: No (per record, hx of threats but no harm to others) Assessment of Violence: On admission (cutting) Does patient have access to weapons?: No Criminal Charges Pending?: No (denies) Does patient have a court date: No (denies) Is patient on probation?: No  Psychosis Hallucinations: Auditory, With command Delusions: Grandiose  Mental Status Report Appearance/Hygiene: Disheveled, Unremarkable, In scrubs Eye Contact: Fair Motor Activity: Freedom of movement Speech: Logical/coherent, Rapid, Pressured Level of Consciousness: Alert Mood: Euthymic Affect: Other (Comment) (happy, smiling, cheerful) Anxiety Level: None Thought Processes: Coherent, Relevant, Flight of Ideas (labile) Judgement: Impaired Orientation: Person, Place Obsessive Compulsive Thoughts/Behaviors: None  Cognitive Functioning Concentration: Decreased Memory: Recent Intact, Remote Intact IQ: Average Insight: Poor Impulse Control: Poor Appetite: Poor Weight Loss:  (UTA) Weight Gain:  (UTA) Sleep: Decreased (pt sts 6-8 hrs; mom sts not sleeping) Total Hours of Sleep:  (UTA) Vegetative Symptoms: Unable to Assess  ADLScreening [facility] Assessment Services) Patient's cognitive ability adequate to safely complete daily activities?: Yes Patient able to express need for assistance with ADLs?: Yes Independently performs ADLs?: Yes (appropriate for developmental age)  Prior Inpatient Therapy Prior Inpatient Therapy: Yes Prior Therapy Dates:  (2008 to present) Prior Therapy Facilty/Provider(s):  ([facility], [facility]) Reason for Treatment:  (Bipolar I, ADHD)  Prior Outpatient Therapy Prior Outpatient Therapy: No Does patient have an ACCT team?: No Does patient have  Intensive In-House Services?  : No Does patient have [facility] services? : No  ADL Screening (condition at time of admission) Patient's cognitive ability adequate to safely complete daily activities?: Yes Patient able to express need for assistance with ADLs?: Yes Independently performs ADLs?: Yes (appropriate for developmental age)       Abuse/Neglect Assessment (Assessment to be complete while patient is alone) Physical Abuse: Denies Verbal Abuse: Denies Sexual Abuse: Denies Exploitation of patient/patient's resources: Denies Self-Neglect: Denies     Merchant navy officerAdvance Directives (For Healthcare) Does patient have an advance directive?: No Would patient like information on creating an advanced directive?: No - patient declined information    Additional Information 1:1 In Past 12 Months?: Yes CIRT Risk: Yes (has required restraint in the past per record) Elopement Risk: Yes Does patient have medical clearance?: Yes     Disposition:  Disposition Initial Assessment Completed for this Encounter: Yes Disposition of Patient: Other dispositions Other disposition(s): Other (Comment) ([facility]  Per Donell SievertSpencer Simon, PA: Recommend IP tx.  Per Clint Bolderori Beck, [facility]: [facility]    Beryle FlockMary Tharun Cappella, MS, CRC, [facility] Triage Specialist [facility] Evani Shrider T  09/24/2015 6:03 AM

## 2015-09-24 NOTE — ED Triage Notes (Signed)
Pt brought in by [employer] IVC'd by her mother. Pt has a history of bipolar disorder and self mutilation and refuses to take her medication.  Mother states pt has been having hallucinations and may be abusing prescription medication and heroin for pain management. Mother reports pt has been climbing onto vehicles and jumping from the windshield.  Self induced cuts on right upper thigh.

## 2015-09-24 NOTE — ED Notes (Signed)
Bed: WA06 Expected date:  Expected time:  Means of arrival:  Comments: Female combative

## 2015-09-24 NOTE — Plan of Care (Signed)
Problem: Education: Goal: Knowledge of [facility] Education information/materials will improve Outcome: Progressing Nurse discussed depression/anxiety/coping skills with patient.    

## 2015-09-24 NOTE — Tx Team (Signed)
Initial Treatment Plan 09/24/2015 6:55 PM [name] [id number]    PATIENT STRESSORS: Marital or family conflict Substance abuse   PATIENT STRENGTHS: Communication skills Physical Health Supportive family/friends   PATIENT IDENTIFIED PROBLEMS: Psychosis  Substance Abuse  "making sure family is happy"  "Making sure that I'm happy"               DISCHARGE CRITERIA:  Ability to meet basic life and health needs Improved stabilization in mood, thinking, and/or behavior Motivation to continue treatment in a less acute level of care Need for constant or close observation no longer present Verbal commitment to aftercare and medication compliance  PRELIMINARY DISCHARGE PLAN: Attend 12-step recovery group Outpatient therapy Return to previous living arrangement  PATIENT/FAMILY INVOLVEMENT: This treatment plan has been presented to and reviewed with the patient, [name].  The patient and family have been given the opportunity to ask questions and make suggestions.  Carleene OverlieMiddleton, Santanna Olenik P, RN 09/24/2015, 6:55 PM

## 2015-09-24 NOTE — Consult Note (Signed)
[facility] Consult   Reason for Consult:  Substance abuse with delusions Referring Physician:  EDP Patient Identification: [name] MRN:  [id number] Principal Diagnosis: Bipolar affective disorder, current episode manic without psychotic symptoms ([place]) Diagnosis:   Patient Active Problem List   Diagnosis Date Noted  . Heroin dependence ([place]) [F11.20] 09/24/2015    Priority: High  . Cocaine abuse [F14.10] 03/17/2011    Priority: High  . Bipolar affective disorder, current episode manic without psychotic symptoms ([place]) [F31.10] 06/24/2007    Priority: High  . Attention deficit disorder [F90.9] 05/01/2014  . Chronic back pain [M54.9, G89.29] 03/05/2013  . BMI 27.0-27.9,adult [Z68.27] 03/05/2013  . Cannabis abuse [F12.10] 03/17/2011  . TOBACCO USER [F17.200] 10/13/2008    Total Time spent with patient: 45 minutes  Subjective:   [name] is a 25 y.o. female patient admitted with substance abuse and psychosis.  HPI:  On admission:  25 y.o. female who presents involuntarily unaccompanied BIB [facility] reporting symptoms of mania and bizarre behavior. Pt behavior is reported as agitated, delusional and hallucinating. Prior to ED visit, had been jumping off car windshields per mother. Pt has a history of Bipolar I, Schizoaffective, Bipolar type, ADHD, Anxiety and polysubstance abuse.  Pt denies all symptoms for depression and anxiety. No collaterals were available for comment.  Pt was a poor historian so, information was obtained from pt, noted and pt record. Pt states she has no current stressors.   Pt denies current suicidal ideation. Pt has made multiple attempts in the past. Pt denied homicidal ideation & a history of aggression or anger outbursts.Per pt record, pt has a hx of verbal, aggressive threats of harm to others including her mother and brother who she threatened to harm with a large hunting knife she purchased when she failed to buy a gun. Pt reports no  legal history.  Pt endorses auditory but not visual hallucinations or other psychotic symptoms. Pt reports medication current prescribed by her PCP,  Dr. Laney Clay, but per her mother pt is not compliant. Pt currently sees no one for OPT and never has per pt record.   Pt lives with her boyfriend.  Per pt record, pt and BF were recently evicted due to pt's bizarre and erratic behavior. Pt sts supports include her mother and brother . Pt reports completing a GED. Pt has very poor insight and impaired judgment. Pt's memory seems impaired. Pt reports no history of physical, verbal, emotional or sexual abuse. Pt reports sleeping 6-8 hours each night although her mother reported in June that she was not sleeping much at all.  Pt sts she is eating regularly and well having no weight loss or gain recently.  ? Pt's treatment history includes no OP treatment per pt's pt record. IP history includes multiple psychiatric hospitalizations at [facility] and [facility]. Last admission was at at [facility] was in 2013. First hospitalization was at age 9 per her mother.  Pt admits to alcohol/recreational substance use including current regular use of cocaine, cannabis, heroin, nicotine (cigarettes) and alcohol. Pt's BAL was <5 and UDS was positive for opiates, cocaine and cannabis when tested for in the ED today.   Today, she is not responding to internal stimuli but is delusional at times, thinking [name] would release her if he was here.  She minimizes her substance abuse and mental issues despite not functioning in the outside world.  Her therapist called with information that her substance abuse is much greater than  she reports.  [name] reports living with her husband but also has a boyfriend.  [name] at times.  Past Psychiatric History: bipolar disorder, polysubstance abuse  Risk to Self: Suicidal Ideation: No (Denies) Suicidal Intent: No Is patient at risk for suicide?: Yes (pt has tried previously & is manic  & impulsive) Suicidal Plan?: No Access to Means: No (sts no access to guns, weapons) What has been your use of drugs/alcohol within the last 12 months?:  (daily use) How many times?:  (multiple per mother) Other Self Harm Risks:  (cutting) Triggers for Past Attempts: Hallucinations, Unpredictable Intentional Self Injurious Behavior: Cutting (new cuts on upper thigh) Comment - Self Injurious Behavior:  (hx of cutting-pt denies) Risk to Others: Homicidal Ideation: No (denies) Thoughts of Harm to Others: No (denies) Current Homicidal Intent: No Current Homicidal Plan: No Access to Homicidal Means: No Identified Victim:  (none noted) History of harm to others?: No (per record, hx of threats but no harm to others) Assessment of Violence: On admission (cutting) Does patient have access to weapons?: No Criminal Charges Pending?: No (denies) Does patient have a court date: No (denies) Prior Inpatient Therapy: Prior Inpatient Therapy: Yes Prior Therapy Dates:  (2008 to present) Prior Therapy Facilty/Provider(s):  ([facility], [place]) Reason for Treatment:  (Bipolar I, ADHD) Prior Outpatient Therapy: Prior Outpatient Therapy: No Does patient have an ACCT team?: No Does patient have Intensive In-House Services?  : No Does patient have [facility] services? : No  Past Medical History:  Past Medical History:  Diagnosis Date  . Anxiety   . Atrial septal defect    corrected 1999  . Bipolar disorder ([place])   . Depression   . Heart murmur   . Mental health problem   . Self-mutilation   . Substance abuse     Past Surgical History:  Procedure Laterality Date  . ASD REPAIR     Family History:  Family History  Problem Relation Age of Onset  . Cancer Mother     breast - both  . Arthritis Maternal Grandmother     rheumatoid  . Cancer Maternal Grandfather     brain  . Alzheimer's disease Paternal Grandmother    Family Psychiatric  History: none Social History:  History  Alcohol Use   . Yes    Comment: a few a week     History  Drug use: Unknown  . Frequency: 3.0 times per week  . Types: Marijuana, Cocaine    Comment: States no per pt    Social History   Social History  . Marital status: Single    Spouse name: N/A  . Number of children: N/A  . Years of education: N/A   Social History Main Topics  . Smoking status: Current Every Day Smoker    Packs/day: 2.00    Years: 7.00    Types: Cigarettes  . Smokeless tobacco: None  . Alcohol use Yes     Comment: a few a week  . Drug use: Unknown    Frequency: 3.0 times per week    Types: Marijuana, Cocaine     Comment: States no per pt  . Sexual activity: Yes    Birth control/ protection: None     Comment: number of sex partners in the last 61 months 1   Other Topics Concern  . None   Social History Narrative  . None   Additional Social History:    Allergies:  No Known Allergies  Labs:  Results for  orders placed or performed during the hospital encounter of 09/24/15 (from the past 48 hour(s))  CBC with Differential/Platelet     Status: None   Collection Time: 09/24/15 12:31 AM  Result Value Ref Range   WBC 7.9 4.0 - 10.5 K/uL   RBC 4.42 3.87 - 5.11 MIL/uL   Hemoglobin 13.7 12.0 - 15.0 g/dL   HCT 40.5 36.0 - 46.0 %   MCV 91.6 78.0 - 100.0 fL   MCH 31.0 26.0 - 34.0 pg   MCHC 33.8 30.0 - 36.0 g/dL   RDW 13.9 11.5 - 15.5 %   Platelets 252 150 - 400 K/uL   Neutrophils Relative % 50 %   Neutro Abs 4.0 1.7 - 7.7 K/uL   Lymphocytes Relative 39 %   Lymphs Abs 3.1 0.7 - 4.0 K/uL   Monocytes Relative 9 %   Monocytes Absolute 0.7 0.1 - 1.0 K/uL   Eosinophils Relative 2 %   Eosinophils Absolute 0.2 0.0 - 0.7 K/uL   Basophils Relative 0 %   Basophils Absolute 0.0 0.0 - 0.1 K/uL  Comprehensive metabolic panel     Status: Abnormal   Collection Time: 09/24/15 12:31 AM  Result Value Ref Range   Sodium 141 135 - 145 mmol/L   Potassium 3.5 3.5 - 5.1 mmol/L   Chloride 112 (H) 101 - 111 mmol/L   CO2 22 22  - 32 mmol/L   Glucose, Bld 101 (H) 65 - 99 mg/dL   BUN 12 6 - 20 mg/dL   Creatinine, Ser 0.77 0.44 - 1.00 mg/dL   Calcium 9.2 8.9 - 10.3 mg/dL   Total Protein 7.4 6.5 - 8.1 g/dL   Albumin 4.5 3.5 - 5.0 g/dL   AST 24 15 - 41 U/L   ALT 16 14 - 54 U/L   Alkaline Phosphatase 38 38 - 126 U/L   Total Bilirubin 0.7 0.3 - 1.2 mg/dL   GFR calc non Af Amer >60 >60 mL/min   GFR calc Af Amer >60 >60 mL/min    Comment: (NOTE) The eGFR has been calculated using the CKD EPI equation. This calculation has not been validated in all clinical situations. eGFR's persistently <60 mL/min signify possible Chronic Kidney Disease.    Anion gap 7 5 - 15  Acetaminophen level     Status: Abnormal   Collection Time: 09/24/15 12:31 AM  Result Value Ref Range   Acetaminophen (Tylenol), Serum <10 (L) 10 - 30 ug/mL    Comment:        THERAPEUTIC CONCENTRATIONS VARY SIGNIFICANTLY. A RANGE OF 10-30 ug/mL MAY BE AN EFFECTIVE CONCENTRATION FOR MANY PATIENTS. HOWEVER, SOME ARE BEST TREATED AT CONCENTRATIONS OUTSIDE THIS RANGE. ACETAMINOPHEN CONCENTRATIONS >150 ug/mL AT 4 HOURS AFTER INGESTION AND >50 ug/mL AT 12 HOURS AFTER INGESTION ARE OFTEN ASSOCIATED WITH TOXIC REACTIONS.   Salicylate level     Status: None   Collection Time: 09/24/15 12:31 AM  Result Value Ref Range   Salicylate Lvl <1.0 2.8 - 30.0 mg/dL  I-Stat Beta hCG blood, ED ([facility], [facility], [facility] only)     Status: None   Collection Time: 09/24/15 12:43 AM  Result Value Ref Range   I-stat hCG, quantitative <5.0 <5 mIU/mL   Comment 3            Comment:   GEST. AGE      CONC.  (mIU/mL)   <=1 WEEK        5 - 50     2 WEEKS  50 - 500     3 WEEKS       100 - 10,000     4 WEEKS     1,000 - 30,000        FEMALE AND NON-PREGNANT FEMALE:     LESS THAN 5 mIU/mL   I-Stat Chem 8, ED     Status: None   Collection Time: 09/24/15 12:44 AM  Result Value Ref Range   Sodium 144 135 - 145 mmol/L   Potassium 3.5 3.5 - 5.1 mmol/L   Chloride 107 101 - 111  mmol/L   BUN 11 6 - 20 mg/dL   Creatinine, Ser 0.80 0.44 - 1.00 mg/dL   Glucose, Bld 98 65 - 99 mg/dL   Calcium, Ion 1.16 1.15 - 1.40 mmol/L   TCO2 23 0 - 100 mmol/L   Hemoglobin 14.6 12.0 - 15.0 g/dL   HCT 43.0 36.0 - 46.0 %  Urine rapid drug screen (hosp performed)     Status: Abnormal   Collection Time: 09/24/15 12:44 AM  Result Value Ref Range   Opiates POSITIVE (A) NONE DETECTED   Cocaine POSITIVE (A) NONE DETECTED   Benzodiazepines NONE DETECTED NONE DETECTED   Amphetamines NONE DETECTED NONE DETECTED   Tetrahydrocannabinol POSITIVE (A) NONE DETECTED   Barbiturates NONE DETECTED NONE DETECTED    Comment:        DRUG SCREEN FOR MEDICAL PURPOSES ONLY.  IF CONFIRMATION IS NEEDED FOR ANY PURPOSE, NOTIFY LAB WITHIN 5 DAYS.        LOWEST DETECTABLE LIMITS FOR URINE DRUG SCREEN Drug Class       Cutoff (ng/mL) Amphetamine      1000 Barbiturate      200 Benzodiazepine   748 Tricyclics       270 Opiates          300 Cocaine          300 THC              50   Ethanol     Status: Abnormal   Collection Time: 09/24/15 12:54 AM  Result Value Ref Range   Alcohol, Ethyl (B) 17 (H) <5 mg/dL    Comment:        LOWEST DETECTABLE LIMIT FOR SERUM ALCOHOL IS 5 mg/dL FOR MEDICAL PURPOSES ONLY     Current Facility-Administered Medications  Medication Dose Route Frequency Provider Last Rate Last Dose  . acetaminophen (TYLENOL) tablet 650 mg  650 mg Oral Q4H PRN April Palumbo, MD      . alum & mag hydroxide-simeth (MAALOX/MYLANTA) 200-200-20 MG/5ML suspension 30 mL  30 mL Oral PRN April Palumbo, MD      . ibuprofen (ADVIL,MOTRIN) tablet 600 mg  600 mg Oral Q8H PRN April Palumbo, MD      . LORazepam (ATIVAN) tablet 1 mg  1 mg Oral Q8H PRN April Palumbo, MD   1 mg at 09/24/15 0206  . nicotine (NICODERM CQ - dosed in mg/24 hours) patch 21 mg  21 mg Transdermal Daily April Palumbo, MD      . ondansetron [facility]) tablet 4 mg  4 mg Oral Q8H PRN April Palumbo, MD       Current Outpatient  Prescriptions  Medication Sig Dispense Refill  . lamoTRIgine (LAMICTAL) 25 MG tablet Take 25 mg by mouth 2 (two) times daily. Reported on 07/19/2015  1  . mirtazapine (REMERON) 30 MG tablet TAKE 1 TABLET(30 MG) BY MOUTH AT BEDTIME 30 tablet 0  . QUEtiapine (SEROQUEL) 300  MG tablet Take 300 mg by mouth at bedtime. Reported on 07/19/2015    . traZODone (DESYREL) 100 MG tablet Take 1 tablet (100 mg total) by mouth at bedtime. 30 tablet 0    Musculoskeletal: Strength & Muscle Tone: within normal limits Gait & Station: normal Patient leans: N/A  Psychiatric Specialty Exam: Physical Exam  Constitutional: She is oriented to person, place, and time. She appears well-developed and well-nourished.  HENT:  Head: Normocephalic.  Neck: Normal range of motion.  Respiratory: Effort normal.  Musculoskeletal: Normal range of motion.  Neurological: She is alert and oriented to person, place, and time.  Skin: Skin is warm and dry.  Psychiatric: Thought content normal. Her mood appears anxious. Her affect is labile. Her speech is [name]. She is hyperactive. Cognition and memory are impaired. She expresses impulsivity. She is inattentive.    Review of Systems  Constitutional: Negative.   HENT: Negative.   Eyes: Negative.   Respiratory: Negative.   Cardiovascular: Negative.   Gastrointestinal: Negative.   Genitourinary: Negative.   Musculoskeletal: Negative.   Skin: Negative.   Neurological: Negative.   Endo/Heme/Allergies: Negative.   Psychiatric/Behavioral: Positive for substance abuse. The patient is nervous/anxious.     Blood pressure 116/69, pulse 83, temperature 97.7 F (36.5 C), temperature source Oral, resp. rate 18, height 5' 10.5" (1.791 m), weight 68.5 kg (151 lb), SpO2 100 %.Body mass index is 21.36 kg/m.  General Appearance: Disheveled  Eye Contact:  Good  Speech:  Normal Rate  Volume:  Normal  Mood:  Anxious  Affect:  Congruent  Thought Process:  Coherent and Descriptions of  Associations: [name]  Orientation:  Full (Time, Place, and Person)  Thought Content:  Delusions and [name]  Suicidal Thoughts:  No  Homicidal Thoughts:  No  Memory:  Immediate;   Fair Recent;   Fair Remote;   Fair  Judgement:  Impaired  Insight:  Lacking  Psychomotor Activity:  Normal  Concentration:  Concentration: Fair and Attention Span: Fair  Recall:  [employer] of Knowledge:  Fair  Language:  Good  Akathisia:  No  Handed:  Right  AIMS (if indicated):     Assets:  Housing Leisure Time Physical Health Resilience Social Support  ADL's:  Intact  Cognition:  WNL  Sleep:        Treatment Plan Summary: Daily contact with patient to assess and evaluate symptoms and progress in treatment, Medication management and Plan bipolar affective disorder most recent episode, mania:  -Crisis stabilization -Medication management:  Clonidine Opiate Withdrawal Protocol started along with gabapentin 300 mg TID for withdrawal symptoms and Trazodone 50 mg at bedtime for sleep. -Individual and substance abuse counseling  Disposition: Recommend psychiatric Inpatient admission when medically cleared.  Waylan Boga, NP 09/24/2015 10:45 AM  Patient seen face-to-face for psychiatric evaluation, chart reviewed and case discussed with the physician extender and developed treatment plan. Reviewed the information documented and agree with the treatment plan. Corena Pilgrim, MD

## 2015-09-24 NOTE — ED Notes (Signed)
[facility] and said, "I will be out in 72 hours."  IV[facility] explained to pt. [facility] All belongings returned to pt who signed for same.

## 2015-09-24 NOTE — Progress Notes (Signed)
Patient ID: [name], female   DOB: 1990-01-31, 25 y.o.   MRN: [id number] D: Client has visitor this shift. Client reports goal for this admission "get myself together, start doing something right" A: Writer provided emotional support, reviewed medications, administered as ordered. R: client is safe on the unit.

## 2015-09-24 NOTE — Progress Notes (Signed)
Admission Note:  25 yr old female who presents IVC, in no acute distress, for the treatment of psychosis. Per report, patient was displaying "mania and bizarre behavior".  Patient report "I don't know why I'm here. My mom committed me because I was disrespectful to her".  Patient was poor historian.  Patient appears animated. Patient was cooperative with admission process.  Patient currently denies SI and contracts for safety upon admission. Patient denies AVH.  Patient appeared to be responding to internal stimuli and randomly making comments inconsistent with topic of conversation and identifying random people that quoted the comments patient was verbalizing.  Patient denied substance abuse.  Patient was positive for cocaine, heroine, thc, and opiates.  Patient reports smoking 2 packs of cigarettes daily.  Patient reports that she currently lives with her husband and identifies "family" as her support system.  [facility] patient would like to work on "making sure family is happy" and "Making sure that I'm happy". Skin was assessed and found to be clear of any abnormal marks.  Patient searched and no contraband found, POC and unit policies explained and understanding verbalized. Consents obtained. Food and fluids offered, and fluids accepted. Patient had no additional questions or concerns.

## 2015-09-24 NOTE — Progress Notes (Signed)
Adult Psychoeducational Group Note  Date:  09/24/2015 Time:  9:21 PM  Group Topic/Focus:  Wrap-Up Group:   The focus of this group is to help patients review their daily goal of treatment and discuss progress on daily workbooks.   Participation Level:  Active  Participation Quality:  Appropriate  Affect:  Appropriate  Cognitive:  Alert and Appropriate  Insight: Good  Engagement in Group:  Engaged  Modes of Intervention:  Activity  Additional Comments:  Patient rated her day a 10. Goal is to keep composure and to get some much needed sleep and be stress free. Natasha MeadKiara M Shontavia Mickel 09/24/2015, 9:21 PM

## 2015-09-24 NOTE — ED Provider Notes (Signed)
[facility]-EMERGENCY DEPT Provider Note   CSN: [id number] Arrival date & time: 09/24/15  0018  By signing my name below, I, [name], attest that this documentation has been prepared under the direction and in the presence of [name], MD. Electronically Signed: [name], ED Scribe. 09/24/15. 12:27 AM.  History   Chief Complaint Chief Complaint  Patient presents with  . IVC   LEVEL 5 CAVEAT: HPI and ROS limited due to psychosis   The history is provided by the patient. No language interpreter was used.   HPI Comments: [name] is a 24 y.o. female brought in under IVC, with a PMhx of anxiety, depression, Bipolar 1 disorder, and cocaine/cannabis abuse, who presents to the Emergency Department brought in under IVC. Pt was placed under IVC and per police, she has been experiencing auditory hallucinations. Per police, she did admit to using heroine, and noted that she has been noncompliant with her rx'd psychiatric medications recently.   Past Medical History:  Diagnosis Date  . Anxiety   . Atrial septal defect    corrected 1999  . Depression   . Heart murmur   . Mental health problem   . Substance abuse    Patient Active Problem List   Diagnosis Date Noted  . Attention deficit disorder 05/01/2014  . Chronic back pain 03/05/2013  . BMI 27.0-27.9,adult 03/05/2013  . Schizoaffective disorder, bipolar type (HCC) 03/17/2011  . Cocaine abuse 03/17/2011  . Cannabis abuse 03/17/2011  . TOBACCO USER 10/13/2008  . BIPOLAR DISORDER UNSPECIFIED 06/24/2007   Past Surgical History:  Procedure Laterality Date  . ASD REPAIR     OB History    No data available     Home Medications    Prior to Admission medications   Medication Sig Start Date End Date Taking? Authorizing Provider  lamoTRIgine (LAMICTAL) 25 MG tablet Take 25 mg by mouth 2 (two) times daily. Reported on 07/19/2015 06/27/15   Historical Provider, MD  mirtazapine (REMERON) 30 MG tablet TAKE 1  TABLET(30 MG) BY MOUTH AT BEDTIME 07/24/15   Tonye Pearson, MD  QUEtiapine (SEROQUEL) 300 MG tablet Take 300 mg by mouth at bedtime. Reported on 07/19/2015 06/27/15   Historical Provider, MD  traZODone (DESYREL) 100 MG tablet Take 1 tablet (100 mg total) by mouth at bedtime. 07/20/15   Shuvon B Rankin, NP   Family History Family History  Problem Relation Age of Onset  . Cancer Mother     breast - both  . Arthritis Maternal Grandmother     rheumatoid  . Cancer Maternal Grandfather     brain  . Alzheimer's disease Paternal Grandmother    Social History Social History  Substance Use Topics  . Smoking status: Current Every Day Smoker    Packs/day: 1.50    Years: 7.00    Types: Cigarettes  . Smokeless tobacco: Never Used  . Alcohol use 1.2 oz/week    2 Glasses of wine per week     Comment: few times a week, 2 drinks per week (wine) and liquor   Allergies   Review of patient's allergies indicates no known allergies.  Review of Systems Review of Systems  Unable to perform ROS: Psychiatric disorder    LEVEL 5 CAVEAT: HPI and ROS limited due to psychosis  Physical Exam Updated Vital Signs Ht 5' 10.5" (1.791 m)   Wt 151 lb (68.5 kg)   BMI 21.36 kg/m   Physical Exam  Constitutional: She appears well-developed and well-nourished.  No distress.  HENT:  Head: Normocephalic.  Mouth/Throat: Oropharynx is clear and moist. No oropharyngeal exudate.  Mallampati class 1 airway.   Eyes: Conjunctivae and EOM are normal. Pupils are equal, round, and reactive to light. Right eye exhibits no discharge. Left eye exhibits no discharge. No scleral icterus.  Pupis are dilated, constricted, and sluggish bilaterally.    Neck: Normal range of motion. Neck supple. No JVD present. No tracheal deviation present.  Trachea is midline. No stridor or carotid bruits.   Cardiovascular: Normal rate, regular rhythm, normal heart sounds and intact distal pulses.   No murmur heard. Pulmonary/Chest: Effort  normal and breath sounds normal. No stridor. No respiratory distress. She has no wheezes. She has no rales.  Lungs CTA bilaterally.  Abdominal: Soft. Bowel sounds are normal. She exhibits no distension. There is no tenderness. There is no rebound and no guarding.  Musculoskeletal: Normal range of motion.  Lymphadenopathy:    She has no cervical adenopathy.  Neurological: She is alert. She has normal reflexes. She displays normal reflexes. She exhibits normal muscle tone.  Skin: Skin is warm. Capillary refill takes less than 2 seconds.  Psychiatric:  Pt seems aggressive, agitated, and seems to be responding to internal stimuli.   Nursing note and vitals reviewed.  ED Treatments / Results  DIAGNOSTIC STUDIES: Oxygen Saturation is 100% on RA, normal by my interpretation.   Labs (all labs ordered are listed, but only abnormal results are displayed) Labs Reviewed - No data to display  EKG  EKG Interpretation None       Radiology No results found.  Procedures Procedures (including critical care time)  Medications Ordered in ED Medications - No data to display  Initial Impression / Assessment and Plan / ED Course  I have reviewed the triage vital signs and the nursing notes.  Pertinent labs & imaging results that were available during my care of the patient were reviewed by me and considered in my medical decision making (see chart for details).  Clinical Course    Vitals:   09/24/15 0026 09/24/15 0028  BP: 139/81   Pulse: 85   Resp: 16   Temp:  98.7 F (37.1 C)   Results for orders placed or performed during the hospital encounter of 09/24/15  CBC with Differential/Platelet  Result Value Ref Range   WBC 7.9 4.0 - 10.5 K/uL   RBC 4.42 3.87 - 5.11 MIL/uL   Hemoglobin 13.7 12.0 - 15.0 g/dL   HCT 56.240.5 13.036.0 - 86.546.0 %   MCV 91.6 78.0 - 100.0 fL   MCH 31.0 26.0 - 34.0 pg   MCHC 33.8 30.0 - 36.0 g/dL   RDW 78.413.9 69.611.5 - 29.515.5 %   Platelets 252 150 - 400 K/uL    Neutrophils Relative % 50 %   Neutro Abs 4.0 1.7 - 7.7 K/uL   Lymphocytes Relative 39 %   Lymphs Abs 3.1 0.7 - 4.0 K/uL   Monocytes Relative 9 %   Monocytes Absolute 0.7 0.1 - 1.0 K/uL   Eosinophils Relative 2 %   Eosinophils Absolute 0.2 0.0 - 0.7 K/uL   Basophils Relative 0 %   Basophils Absolute 0.0 0.0 - 0.1 K/uL  Comprehensive metabolic panel  Result Value Ref Range   Sodium 141 135 - 145 mmol/L   Potassium 3.5 3.5 - 5.1 mmol/L   Chloride 112 (H) 101 - 111 mmol/L   CO2 22 22 - 32 mmol/L   Glucose, Bld 101 (H) 65 - 99 mg/dL  BUN 12 6 - 20 mg/dL   Creatinine, Ser 4.09 0.44 - 1.00 mg/dL   Calcium 9.2 8.9 - 81.1 mg/dL   Total Protein 7.4 6.5 - 8.1 g/dL   Albumin 4.5 3.5 - 5.0 g/dL   AST 24 15 - 41 U/L   ALT 16 14 - 54 U/L   Alkaline Phosphatase 38 38 - 126 U/L   Total Bilirubin 0.7 0.3 - 1.2 mg/dL   GFR calc non Af Amer >60 >60 mL/min   GFR calc Af Amer >60 >60 mL/min   Anion gap 7 5 - 15  Acetaminophen level  Result Value Ref Range   Acetaminophen (Tylenol), Serum <10 (L) 10 - 30 ug/mL  Urine rapid drug screen (hosp performed)  Result Value Ref Range   Opiates POSITIVE (A) NONE DETECTED   Cocaine POSITIVE (A) NONE DETECTED   Benzodiazepines NONE DETECTED NONE DETECTED   Amphetamines NONE DETECTED NONE DETECTED   Tetrahydrocannabinol POSITIVE (A) NONE DETECTED   Barbiturates NONE DETECTED NONE DETECTED  Salicylate level  Result Value Ref Range   Salicylate Lvl <4.0 2.8 - 30.0 mg/dL  I-Stat Chem 8, ED  Result Value Ref Range   Sodium 144 135 - 145 mmol/L   Potassium 3.5 3.5 - 5.1 mmol/L   Chloride 107 101 - 111 mmol/L   BUN 11 6 - 20 mg/dL   Creatinine, Ser 9.14 0.44 - 1.00 mg/dL   Glucose, Bld 98 65 - 99 mg/dL   Calcium, Ion 7.82 9.56 - 1.40 mmol/L   TCO2 23 0 - 100 mmol/L   Hemoglobin 14.6 12.0 - 15.0 g/dL   HCT 21.3 08.6 - 57.8 %  I-Stat Beta hCG blood, ED ([facility], [facility], [facility] only)  Result Value Ref Range   I-stat hCG, quantitative <5.0 <5 mIU/mL   Comment 3            No results found. Medications  haloperidol lactate (HALDOL) injection 5 mg (5 mg Intramuscular Given 09/24/15 0125)     Final Clinical Impressions(s) / ED Diagnoses   Final diagnoses:  None    New Prescriptions New Prescriptions   No medications on file    Psychosis, likely secondary to polysubstance abuse.  Under IVC.  Clearance by TTS.     I personally performed the services described in this documentation, which was scribed in my presence. The recorded information has been reviewed and is accurate.      Cy Blamer, MD 09/24/15 [phone]
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
## 2015-09-24 NOTE — ED Notes (Signed)
[facility] Ex[facility] date:  Expected time:  Means of arrival:  Comments: Saved for room 6

## 2015-09-25 ENCOUNTER — Encounter (HOSPITAL_COMMUNITY): Payer: Self-pay | Admitting: Psychiatry

## 2015-09-25 DIAGNOSIS — F17200 Nicotine dependence, unspecified, uncomplicated: Secondary | ICD-10-CM | POA: Clinically undetermined

## 2015-09-25 DIAGNOSIS — F3113 Bipolar disorder, current episode manic without psychotic features, severe: Secondary | ICD-10-CM | POA: Clinically undetermined

## 2015-09-25 DIAGNOSIS — F1120 Opioid dependence, uncomplicated: Secondary | ICD-10-CM | POA: Diagnosis present

## 2015-09-25 DIAGNOSIS — F1220 Cannabis dependence, uncomplicated: Secondary | ICD-10-CM | POA: Clinically undetermined

## 2015-09-25 DIAGNOSIS — F1410 Cocaine abuse, uncomplicated: Secondary | ICD-10-CM | POA: Clinically undetermined

## 2015-09-25 MED ORDER — LAMOTRIGINE 25 MG PO TABS
25.0000 mg | ORAL_TABLET | Freq: Every day | ORAL | Status: DC
  Administered 2015-09-25 – 2015-09-26 (×2): 25 mg via ORAL
  Filled 2015-09-25 (×4): qty 1

## 2015-09-25 MED ORDER — TRAZODONE HCL 100 MG PO TABS
100.0000 mg | ORAL_TABLET | Freq: Every evening | ORAL | Status: DC | PRN
  Administered 2015-09-25 – 2015-09-29 (×4): 100 mg via ORAL
  Filled 2015-09-25 (×4): qty 1

## 2015-09-25 MED ORDER — ARIPIPRAZOLE 5 MG PO TABS
5.0000 mg | ORAL_TABLET | Freq: Every day | ORAL | Status: DC
  Administered 2015-09-25: 5 mg via ORAL
  Filled 2015-09-25 (×2): qty 1

## 2015-09-25 MED ORDER — ARIPIPRAZOLE ER 400 MG IM SUSR: 400.0000 mg | INTRAMUSCULAR | Status: DC
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
## 2015-09-25 NOTE — [facility] Suicide Risk Assessment (Signed)
[facility]   Nursing information obtained from:  Patient Demographic factors:  Caucasian, Unemployed Current Mental Status:  NA Loss Factors:  NA Historical Factors:  NA Risk Reduction Factors:  Living with another person, especially a relative, Positive social support  Total Time spent with patient: 30 minutes Principal Problem: Bipolar disorder, current episode manic w/o psychotic features, severe (HCC) Diagnosis:   Patient Active Problem List   Diagnosis Date Noted  . Bipolar disorder, current episode manic w/o psychotic features, severe (HCC) [F31.13] 09/25/2015  . Cocaine use disorder, mild, abuse [F14.10] 09/25/2015  . Cannabis use disorder, severe, dependence (HCC) [F12.20] 09/25/2015  . Tobacco use disorder [F17.200] 09/25/2015  . Opioid use disorder, moderate, dependence (HCC) [F11.20] 09/25/2015  . Attention deficit disorder [F90.9] 05/01/2014  . Chronic back pain [M54.9, G89.29] 03/05/2013  . BMI 27.0-27.9,adult [Z68.27] 03/05/2013   Subjective Data: Please see H&P.   Continued Clinical Symptoms:  Alcohol Use Disorder Identification Test Final Score (AUDIT): 0 The "Alcohol Use Disorders Identification Test", Guidelines for Use in Primary Care, Second Edition.  World Science writerHealth Organization [facility](WHO). Score between 0-7:  no or low risk or alcohol related problems. Score between 8-15:  moderate risk of alcohol related problems. Score between 16-19:  high risk of alcohol related problems. Score 20 or above:  warrants further diagnostic evaluation for alcohol dependence and treatment.   CLINICAL FACTORS:   Bipolar Disorder: manic Alcohol/Substance Abuse/Dependencies   Musculoskeletal: Strength & Muscle Tone: within normal limits Gait & Station: normal Patient leans: N/A  Psychiatric Specialty Exam: Physical Exam  ROS  Blood pressure 97/78, pulse 95, temperature 98.3 F (36.8 C), temperature source Oral, resp. rate 20, height 5' 10.5" (1.791  m), weight 71.4 kg (157 lb 8 oz).Body mass index is 22.28 kg/m.    Please see H&P For MSE. COGNITIVE FEATURES THAT CONTRIBUTE TO RISK:  Closed-mindedness, Polarized thinking and Thought constriction (tunnel vision)    SUICIDE RISK:   Moderate:  Frequent suicidal ideation with limited intensity, and duration, some specificity in terms of plans, no associated intent, good self-control, limited dysphoria/symptomatology, some risk factors present, and identifiable protective factors, including available and accessible social support.   PLAN OF CARE: Please see H&P.   I certify that inpatient services furnished can reasonably be expected to improve the patient's condition.  Amarri Satterly, MD 09/25/2015, 10:35 AM

## 2015-09-25 NOTE — Progress Notes (Addendum)
Recreation Therapy Notes  Date: 09/25/15 Time: 1000 Location: 500 Hall Dayroom  Group Topic: Coping Skills  Goal Area(s) Addresses:  Patient will be able to identify positive coping skills. Patient will be able to identify the benefits of coping skills. Patient will be able to how using coping skills will help them post d/c.  Behavioral Response: Engaged  Intervention: Programmer, systemspider web worksheet, colored pencils  Activity: OrthoptistWeb Design.  Patients are to write down what has them stuck in the center of the web.  Patients are to then come up with at least 6 coping skills that will help them become unstuck and write those on the outside of the web.  Education: PharmacologistCoping Skills, Building control surveyorDischarge Planning.   Education Outcome: Acknowledges understanding/In group clarification offered/Needs additional education.   Clinical Observations/Feedback  Pt stated coping skills "let you know you are not the only one that is affected by what you go through and that you are loved".  Pt listed some of the things that have her stuck as opiates, meanness, depression and coldness.  Pt identified her coping skills as staying strong and working through the pain.  Pt also stated that if she uses her coping skills she will be able to work through her "pains and problems".   Caroll RancherMarjette Marlisa Caridi, LRT/CTRS   Lillia AbedLindsay, Ho Parisi A 09/25/2015 1:19 PM

## 2015-09-25 NOTE — [facility] Group Notes (Signed)
[facility] LCSW Group Therapy  09/25/2015 1:33 PM   Type of Therapy:  Group Therapy   Participation Level:  Engaged  Participation Quality:  Attentive  Affect:  Appropriate   Cognitive:  Alert   Insight:  Engaged  Engagement in Therapy:  Improving   Modes of Intervention:  Education, Exploration, Socialization   Summary of Progress/Problems:  [name] was in and out of the group session. Engaged while in attendance.   [name] from [employer] was here to tell his story of recovery, inform patients about [facility] and play his guitar.   [name] 09/25/2015 1:33 PM

## 2015-09-25 NOTE — H&P (Addendum)
Psychiatric Admission Assessment Adult  Patient Identification: [name] MRN:  [id number] Date of Evaluation:  09/25/2015 Chief Complaint: Pt states " I was on top of the car when my husband tried to drive away , I was just trying to stop the car.'   Principal Diagnosis: Bipolar disorder, current episode manic w/o psychotic features, severe ([place]) Diagnosis:   Patient Active Problem List   Diagnosis Date Noted  . Bipolar disorder, current episode manic w/o psychotic features, severe ([place]) [F31.13] 09/25/2015  . Cocaine use disorder, mild, abuse [F14.10] 09/25/2015  . Cannabis use disorder, severe, dependence ([place]) [F12.20] 09/25/2015  . Tobacco use disorder [F17.200] 09/25/2015  . Opioid use disorder, moderate, dependence ([place]) [F11.20] 09/25/2015  . Attention deficit disorder [F90.9] 05/01/2014  . Chronic back pain [M54.9, G89.29] 03/05/2013  . BMI 27.0-27.9,adult [Z68.27] 03/05/2013   History of Present Illness: [name] is a 25 y.o. caucasian female , married ? ( Pt reports) , employed , who lives in [place] , has a hx of Bipolar disorder , presented  involuntarily unaccompanied BIB [facility] reporting symptoms of mania and bizarre behavior.     Per initial notes in EHR : ' Pt behavior is reported as agitated, delusional and hallucinating. Prior to ED visit, had been jumping off car windshields per mother. Pt has a history of Bipolar I, Schizoaffective, Bipolar type, ADHD, Anxiety and polysubstance abuse. Per pt record, pt has a hx of verbal, aggressive threats of harm to others including her mother and brother who she threatened to harm with a large hunting knife she purchased when she failed to buy a gun. Pt reports no legal history.  Pt endorses auditory but not visual hallucinations or other psychotic symptoms. Pt reports medication current prescribed by her PCP,  Dr. Laney Pastor, but per her mother pt is not compliant. Pt currently sees no one for OPT and never has per pt record.  Pt lives with her boyfriend.  Per pt record, pt and BF were recently evicted due to pt's bizarre and erratic behavior. Pt sts supports include her mother and brother . Pt reports completing a GED. Pt has very poor insight and impaired judgment. Pt's memory seems impaired. Pt reports no history of physical, verbal, emotional or sexual abuse. Pt reports sleeping 6-8 hours each night although her mother reported in June that she was not sleeping much at all.  Pt sts she is eating regularly and well having no weight loss or gain recently.Pt admits to alcohol/recreational substance use including current regular use of cocaine, cannabis, heroin, nicotine (cigarettes) and alcohol. "  ? Patient seen and chart reviewed today .Discussed patient with treatment team. Pt today is seen as euphoric , manic, labile , states she ended up here due to her drug abuse. Pt reports periods when she is energetic , elated , has restlessness and less sleep. Pt reports she abuses cocaine occasionally , percocet on and off , marijuana every other day . Pt has UDS positive for opiates, cocaine and THC. BAL-17. Pt reports she does not remember much , but was trying to stop her husband's car by jumping over it . Pt reports she has been seeing a psychiatrist at [facility] - and he had prescribed her geodon.She is ok with changing her antipsychotic medication to an LAI . She has tried risperidone in the past and tolerated it well. Pt reports she was able to get in to a program called [facility] last Monday and wants to continue the same.  Associated Signs/Symptoms: Depression Symptoms:  psychomotor agitation, (Hypo) Manic Symptoms:  Distractibility, Elevated Mood, Grandiosity, Impulsivity, Irritable Mood, Labiality of Mood, Anxiety Symptoms:  denies Psychotic Symptoms:  Delusions, PTSD Symptoms: Negative Total Time spent with patient: 1 hour  Past Psychiatric History: Patient was diagnosed with mental illness at the  age of 51 , was admitted in an IP hospital at that time. Pt was also admitted at [facility] ( 2012), [facility] in the past. Pt per chart has a hx of several suicide attempts - but she denies it.Pt currently reports she sees a provider in [place] .  Is the patient at risk to self? Yes.    Has the patient been a risk to self in the past 6 months? No.  Has the patient been a risk to self within the distant past? Yes.    Is the patient a risk to others? Yes.    Has the patient been a risk to others in the past 6 months? No.  Has the patient been a risk to others within the distant past? No.   Prior Inpatient Therapy:  SEE ABOVE Prior Outpatient Therapy:    Alcohol Screening: 1. How often do you have a drink containing alcohol?: Never 9. Have you or someone else been injured as a result of your drinking?: No 10. Has a relative or friend or a doctor or another health worker been concerned about your drinking or suggested you cut down?: No Alcohol Use Disorder Identification Test Final Score (AUDIT): 0 Brief Intervention: AUDIT score less than 7 or less-screening does not suggest unhealthy drinking-brief intervention not indicated Substance Abuse History in the last 12 months:  Yes.  cocaine, cannabis, opioids, alcohol- see above  Consequences of Substance Abuse: Medical Consequences:  Admissions to IP unit Family Consequences:  relational struggles Previous Psychotropic Medications: Yes - risperidone Psychological Evaluations: No  Past Medical History:  Past Medical History:  Diagnosis Date  . Anxiety   . Atrial septal defect    corrected 1999  . Bipolar disorder ([place])   . Depression   . Heart murmur   . Mental health problem   . Self-mutilation   . Substance abuse     Past Surgical History:  Procedure Laterality Date  . ASD REPAIR     Family History:  Family History  Problem Relation Age of Onset  . Cancer Mother     breast - both  . Arthritis Maternal Grandmother     rheumatoid  .  Cancer Maternal Grandfather     brain  . Alzheimer's disease Paternal Grandmother   . Mental illness Neg Hx   . Drug abuse Neg Hx   . Suicidality Neg Hx    Family Psychiatric  History: denies Tobacco Screening: Have you used any form of tobacco in the last 30 days? 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 History  Alcohol Use  . Yes    Comment: a few a week     History  Drug use: Unknown  . Frequency: 3.0 times per week  . Types: Marijuana, Cocaine    Comment: States  no per pt    Additional Social History:                           Allergies:  No Known Allergies Lab Results:  Results for orders placed or performed during the hospital encounter of 09/24/15 (from the past 48 hour(s))  CBC with Differential/Platelet     Status: None   Collection Time: 09/24/15 12:31 AM  Result Value Ref Range   WBC 7.9 4.0 - 10.5 K/uL   RBC 4.42 3.87 - 5.11 MIL/uL   Hemoglobin 13.7 12.0 - 15.0 g/dL   HCT 40.5 36.0 - 46.0 %   MCV 91.6 78.0 - 100.0 fL   MCH 31.0 26.0 - 34.0 pg   MCHC 33.8 30.0 - 36.0 g/dL   RDW 13.9 11.5 - 15.5 %   Platelets 252 150 - 400 K/uL   Neutrophils Relative % 50 %   Neutro Abs 4.0 1.7 - 7.7 K/uL   Lymphocytes Relative 39 %   Lymphs Abs 3.1 0.7 - 4.0 K/uL   Monocytes Relative 9 %   Monocytes Absolute 0.7 0.1 - 1.0 K/uL   Eosinophils Relative 2 %   Eosinophils Absolute 0.2 0.0 - 0.7 K/uL   Basophils Relative 0 %   Basophils Absolute 0.0 0.0 - 0.1  K/uL  Comprehensive metabolic panel     Status: Abnormal   Collection Time: 09/24/15 12:31 AM  Result Value Ref Range   Sodium 141 135 - 145 mmol/L   Potassium 3.5 3.5 - 5.1 mmol/L   Chloride 112 (H) 101 - 111 mmol/L   CO2 22 22 - 32 mmol/L   Glucose, Bld 101 (H) 65 - 99 mg/dL   BUN 12 6 - 20 mg/dL   Creatinine, Ser 0.77 0.44 - 1.00 mg/dL   Calcium 9.2 8.9 - 10.3 mg/dL   Total Protein 7.4 6.5 - 8.1 g/dL   Albumin 4.5 3.5 - 5.0 g/dL   AST 24 15 - 41 U/L   ALT 16 14 - 54 U/L   Alkaline Phosphatase 38 38 - 126 U/L   Total Bilirubin 0.7 0.3 - 1.2 mg/dL   GFR calc non Af Amer >60 >60 mL/min   GFR calc Af Amer >60 >60 mL/min    Comment: (NOTE) The eGFR has been calculated using the CKD EPI equation. This calculation has not been validated in all clinical situations. eGFR's persistently <60 mL/min signify possible Chronic Kidney Disease.    Anion gap 7 5 - 15  Acetaminophen level     Status: Abnormal   Collection Time: 09/24/15 12:31 AM  Result Value Ref Range   Acetaminophen (Tylenol), Serum <10 (L) 10 - 30 ug/mL    Comment:        THERAPEUTIC CONCENTRATIONS VARY SIGNIFICANTLY. A RANGE OF 10-30 ug/mL MAY BE AN EFFECTIVE CONCENTRATION FOR MANY PATIENTS. HOWEVER, SOME ARE BEST TREATED AT CONCENTRATIONS OUTSIDE THIS RANGE. ACETAMINOPHEN CONCENTRATIONS >150 ug/mL AT 4 HOURS AFTER INGESTION AND >50 ug/mL AT 12 HOURS AFTER INGESTION ARE OFTEN ASSOCIATED WITH TOXIC REACTIONS.   Salicylate level     Status: None   Collection Time: 09/24/15 12:31 AM  Result Value Ref Range   Salicylate Lvl <5.9 2.8 - 30.0 mg/dL  I-Stat Beta hCG blood, ED ([facility], [facility], [facility] only)     Status: None   Collection Time: 09/24/15 12:43 AM  Result Value Ref Range   I-stat hCG, quantitative <5.0 <5 mIU/mL   Comment 3  Comment:   GEST. AGE      CONC.  (mIU/mL)   <=1 WEEK        5 - 50     2 WEEKS       50 - 500     3 WEEKS       100 - 10,000     4 WEEKS     1,000 - 30,000        FEMALE AND  NON-PREGNANT FEMALE:     LESS THAN 5 mIU/mL   I-Stat Chem 8, ED     Status: None   Collection Time: 09/24/15 12:44 AM  Result Value Ref Range   Sodium 144 135 - 145 mmol/L   Potassium 3.5 3.5 - 5.1 mmol/L   Chloride 107 101 - 111 mmol/L   BUN 11 6 - 20 mg/dL   Creatinine, Ser 0.80 0.44 - 1.00 mg/dL   Glucose, Bld 98 65 - 99 mg/dL   Calcium, Ion 1.16 1.15 - 1.40 mmol/L   TCO2 23 0 - 100 mmol/L   Hemoglobin 14.6 12.0 - 15.0 g/dL   HCT 43.0 36.0 - 46.0 %  Urine rapid drug screen (hosp performed)     Status: Abnormal   Collection Time: 09/24/15 12:44 AM  Result Value Ref Range   Opiates POSITIVE (A) NONE DETECTED   Cocaine POSITIVE (A) NONE DETECTED   Benzodiazepines NONE DETECTED NONE DETECTED   Amphetamines NONE DETECTED NONE DETECTED   Tetrahydrocannabinol POSITIVE (A) NONE DETECTED   Barbiturates NONE DETECTED NONE DETECTED    Comment:        DRUG SCREEN FOR MEDICAL PURPOSES ONLY.  IF CONFIRMATION IS NEEDED FOR ANY PURPOSE, NOTIFY LAB WITHIN 5 DAYS.        LOWEST DETECTABLE LIMITS FOR URINE DRUG SCREEN Drug Class       Cutoff (ng/mL) Amphetamine      1000 Barbiturate      200 Benzodiazepine   350 Tricyclics       093 Opiates          300 Cocaine          300 THC              50   Ethanol     Status: Abnormal   Collection Time: 09/24/15 12:54 AM  Result Value Ref Range   Alcohol, Ethyl (B) 17 (H) <5 mg/dL    Comment:        LOWEST DETECTABLE LIMIT FOR SERUM ALCOHOL IS 5 mg/dL FOR MEDICAL PURPOSES ONLY     Blood Alcohol level:  Lab Results  Component Value Date   ETH 17 (H) 09/24/2015   ETH <5 81/82/9937    Metabolic Disorder Labs:  No results found for: HGBA1C, MPG No results found for: PROLACTIN No results found for: CHOL, TRIG, HDL, CHOLHDL, VLDL, LDLCALC  Current Medications: Current Facility-Administered Medications  Medication Dose Route Frequency Provider Last Rate Last Dose  . acetaminophen (TYLENOL) tablet 650 mg  650 mg Oral Q4H PRN Patrecia Pour, NP   650 mg at 09/25/15 1696  . alum & mag hydroxide-simeth (MAALOX/MYLANTA) 200-200-20 MG/5ML suspension 30 mL  30 mL Oral PRN Patrecia Pour, NP      . cloNIDine (CATAPRES) tablet 0.1 mg  0.1 mg Oral QID Patrecia Pour, NP   0.1 mg at 09/25/15 0809   Followed by  . [START ON 09/26/2015] cloNIDine (CATAPRES) tablet 0.1 mg  0.1 mg Oral BH-qamhs Patrecia Pour, NP  Followed by  . [START ON 09/28/2015] cloNIDine (CATAPRES) tablet 0.1 mg  0.1 mg Oral QAC breakfast Patrecia Pour, NP      . dicyclomine (BENTYL) tablet 20 mg  20 mg Oral Q6H PRN Patrecia Pour, NP      . gabapentin (NEURONTIN) capsule 300 mg  300 mg Oral TID Patrecia Pour, NP   300 mg at 09/25/15 0809  . hydrOXYzine (ATARAX/VISTARIL) tablet 25 mg  25 mg Oral Q6H PRN Patrecia Pour, NP   25 mg at 09/25/15 0520  . ibuprofen (ADVIL,MOTRIN) tablet 600 mg  600 mg Oral Q8H PRN Patrecia Pour, NP      . loperamide (IMODIUM) capsule 2-4 mg  2-4 mg Oral PRN Patrecia Pour, NP      . magnesium hydroxide (MILK OF MAGNESIA) suspension 30 mL  30 mL Oral Daily PRN Patrecia Pour, NP      . methocarbamol (ROBAXIN) tablet 500 mg  500 mg Oral Q8H PRN Patrecia Pour, NP      . nicotine polacrilex (NICORETTE) gum 2 mg  2 mg Oral PRN Ursula Alert, MD   2 mg at 09/25/15 0521  . ondansetron (ZOFRAN-ODT) disintegrating tablet 4 mg  4 mg Oral Q6H PRN Patrecia Pour, NP      . risperiDONE (RISPERDAL M-TABS) disintegrating tablet 2 mg  2 mg Oral Q8H PRN Kerrie Buffalo, NP   2 mg at 09/24/15 1607   And  . ziprasidone (GEODON) injection 20 mg  20 mg Intramuscular PRN Kerrie Buffalo, NP      . traZODone (DESYREL) tablet 50 mg  50 mg Oral QHS PRN Patrecia Pour, NP   50 mg at 09/24/15 2128   PTA Medications: Prescriptions Prior to Admission  Medication Sig Dispense Refill Last Dose  . ziprasidone (GEODON) 20 MG capsule Take 20 mg by mouth 2 (two) times daily with a meal.   09/23/2015 at Unknown time  . mirtazapine (REMERON) 30 MG tablet TAKE 1  TABLET(30 MG) BY MOUTH AT BEDTIME (Patient not taking: Reported on 09/24/2015) 30 tablet 0 Not Taking at Unknown time  . traZODone (DESYREL) 100 MG tablet Take 1 tablet (100 mg total) by mouth at bedtime. (Patient not taking: Reported on 09/24/2015) 30 tablet 0 Not Taking at Unknown time    Musculoskeletal: Strength & Muscle Tone: within normal limits Gait & Station: normal Patient leans: N/A  Psychiatric Specialty Exam: Physical Exam  Nursing note and vitals reviewed. Constitutional:  I CONCUR WITH PE DONE IN ED.    Review of Systems  Psychiatric/Behavioral: Positive for substance abuse. The patient is nervous/anxious and has insomnia.   All other systems reviewed and are negative.   Blood pressure 97/78, pulse 95, temperature 98.3 F (36.8 C), temperature source Oral, resp. rate 20, height 5' 10.5" (1.791 m), weight 71.4 kg (157 lb 8 oz).Body mass index is 22.28 kg/m.  General Appearance: Fairly Groomed  Eye Contact:  Fair  Speech:  Pressured  Volume:  Normal  Mood:  Euphoric  Affect:  Congruent  Thought Process:  Goal Directed and Descriptions of Associations: Circumstantial  Orientation:  Full (Time, Place, and Person)  Thought Content:  Rumination  Suicidal Thoughts:  No but is impulsive , manic , euphoric - jumped on top of a moving car prior to admission  Homicidal Thoughts:  No  Memory:  Immediate;   Fair Recent;   Fair Remote;   Fair  Judgement:  Impaired  Insight:  Shallow  Psychomotor Activity:  Increased and Restlessness  Concentration:  Concentration: Fair and Attention Span: Fair  Recall:  [employer] of Knowledge:  Fair  Language:  Fair  Akathisia:  No  Handed:  Right  AIMS (if indicated):     Assets:  Desire for Improvement  ADL's:  Intact  Cognition:  WNL  Sleep:  Number of Hours: 6       Treatment Plan Summary:Oris E Zucker is a 25 y.o. caucasian female , married ? ( Pt reports) , employed , who lives in [place] , has a hx of Bipolar disorder ,  presented  involuntarily unaccompanied BIB [facility] reporting symptoms of mania and bizarre behavior.   Patient continues to be manic- will need IP admission and treatment.   Daily contact with patient to assess and evaluate symptoms and progress in treatment and Medication management   Patient will benefit from inpatient treatment and stabilization.  Estimated length of stay is 5-7 days.  Reviewed past medical records,treatment plan.  Will start a trial of Lamictal 25 mg po daily for mood lability. Will add Abilify 5 mg po qhs for mood sx- with plan to offer Abilify Maintena 400 mg IM q28 days . Will continue Gabapentin 300 mg po tid for anxiety sx/pain. Will increase Trazodone 100 mg po qhs prn for sleep. Will continue COWS/Clonidine protocol for opioid withdrawal sx. Will continue to monitor vitals ,medication compliance and treatment side effects while patient is here.  Will monitor for medical issues as well as call consult as needed.  Reviewed labs cbc - wnl, cmp - wnl, pregnancy test - negative, UDS- pos for thc, cocaine, opioid, BAL - 17 , EKG for qtc - wnl ,will order tsh, lipid panel, hba1c, pl . Provided substance abuse counseling - pt is motivated to return to [place] will start working on disposition.  Patient to participate in therapeutic milieu .       Observation Level/Precautions:  15 minute checks    Psychotherapy:  Individual and group therapy     Consultations:  csw  Discharge Concerns:  Stability and safety       I certify that inpatient services furnished can reasonably be expected to improve the patient's condition.    Ursula Alert, MD 8/30/201710:36 AM

## 2015-09-25 NOTE — [facility] Suicide Risk Assessment (Signed)
[facility] INPATIENT:  Family/Significant Other Suicide Prevention Education  Suicide Prevention Education:  Patient Refusal for Family/Significant Other Suicide Prevention Education: The patient [name] has refused to provide written consent for family/significant other to be provided Family/Significant Other Suicide Prevention Education during admission and/or prior to discharge.  Physician notified.  Patient states she has no access to weapons.   [name] 09/25/2015, 10:30 AM

## 2015-09-25 NOTE — Progress Notes (Signed)
Adult Psychoeducational Group Note  Date:  09/25/2015 Time:  9:20 PM  Group Topic/Focus:  Wrap-Up Group:   The focus of this group is to help patients review their daily goal of treatment and discuss progress on daily workbooks.   Participation Level:  Active  Participation Quality:  Appropriate  Affect:  Appropriate  Cognitive:  Appropriate  Insight: Limited  Engagement in Group:  Engaged  Modes of Intervention:  Socialization and Support  Additional Comments:  Patient attended and participated in group tonight. She reports having a good day. She went to groups and meals. She went outside with the group. Her boyfriend, mother and brother visited with her. She spoke with her Doctor and the plan is for her to leave tomorrow.    Lita MainsFrancis, [facility] 09/25/2015, 9:20 PM

## 2015-09-25 NOTE — Progress Notes (Signed)
Recreation Therapy Notes  INPATIENT RECREATION THERAPY ASSESSMENT  Patient Details Name: Ok AnisLauren E Wishon MRN: [id number] DOB: [birth year] Today's Date: 09/25/2015  Patient Stressors: Work  Pt stated she was here for a manic episode.  Coping Skills:   Substance Abuse, Exercise, Art/Dance, Talking, Music, Sports  Personal Challenges: Anger, Communication, Concentration, Decision-Making, Expressing Yourself, Problem-Solving, Relationships, Self-Esteem/Confidence, Social Interaction, Stress Management, Substance Abuse, Time Management, Work International aid/development workererformance, Licensed conveyancerTrusting Others  Leisure Interests (2+):  Exercise - Running, Social - Friends, Social - Family, Garment/textile technologistCommunity - Cabin crewMovies  Awareness of Community Resources:  Yes  Community Resources:  Research scientist (physical sciences)Movie Theaters, Engineering geologistLibrary, [employer], Other (Comment) Water quality scientist(Grocery store)  Current Use: Yes  Patient Strengths:  Kind, successful  Patient Identified Areas of Improvement:  Smoking cigarettes, drug use  Current Recreation Participation:  Every other day  Patient Goal for Hospitalization:  "Be happy and healthy"  [place]  [place]idence[place]   Current [place] (including self-harm):  No  Current HI:  No  Consent to Intern Participation: N/A  Caroll RancherMarjette Shloka Baldridge, LRT/CTRS  Caroll RancherLindsay, Cherise Fedder A 09/25/2015, 3:46 PM

## 2015-09-25 NOTE — BHH Counselor (Signed)
Adult Comprehensive Assessment  Patient ID: Ok AnisLauren E Halteman, female   DOB: November 08, 1990, 25 y.o.   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 Family Relationships: close to parents, siblings and husband Surveyor, quantityinancial / Lack of resources (include bankruptcy): no concerns Housing / Lack of housing: stable Physical health (include injuries & life threatening diseases): no concerns Social relationships: states she has friends Substance abuse: "I need to do something about it", smokes THC every other day, "its too strong, makes me feel numb", husband is on prescribed THC so pt feels it will be difficult for her to stop however she declined referrals for SA tx Bereavement / Loss: "my friend died when I was [age] and I still think about her"  Living/Environment/Situation:  Living Arrangements: Spouse/significant other Living conditions (as described by patient or guardian): comfortable, no needs How long has patient lived in current situation?: 3 years What is atmosphere in current home: Supportive  Family History:  Marital status: Married Number of Years Married: 3 What types of issues is patient dealing with in the relationship?: it is "wonderful" Are you sexually active?: Yes What is your sexual orientation?: heterosexual Has your sexual activity been affected by drugs, alcohol, medication, or emotional stress?: unknown Does patient have children?: No  Childhood History:  By whom was/is the patient raised?: Both parents Additional childhood history information: parents divorced when she was 10 Description of patient's relationship with caregiver when they were a child: good Patient's description of current relationship with people who raised him/her: close to mother who lives in [place] and father who lives in [place] How were you  disciplined when you got in trouble as a child/adolescent?: unknown Does patient have siblings?: Yes Number of Siblings: 2 Description of patient's current relationship with siblings: older and younger brothers Did patient suffer any verbal/emotional/physical/sexual abuse as a child?: No Did patient suffer from severe childhood neglect?: No Has patient ever been sexually abused/assaulted/raped as an adolescent or adult?: No Was the patient ever a victim of a crime or a disaster?: No Witnessed domestic violence?: No Has patient been effected by domestic violence as an adult?: No  Education:  Highest grade of school patient has completed: some college, "I stopped because I was always crying in class, stress" Currently a student?: No Learning disability?: No  Employment/Work Situation:   Employment situation: Employed Where is patient 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 How long has patient been employed?: 3 years Patient's job has been impacted by current illness: No What is the longest time patient has a held a job?: 3 years Has patient ever been in the Eli Lilly and Companymilitary?: No Has patient ever served in combat?: No Did You Receive Any Psychiatric Treatment/Services While in Equities traderthe Military?: No Are There Guns or Other Weapons in Your Home?: No  Financial Resources:   Financial resources: Income from employment, Private insurance Does patient have a representative payee or guardian?: No  Alcohol/Substance Abuse:   What has been your use of drugs/alcohol within the last 12 months?: drinks alcohol once/month; smokes THC evey other day and wants to stop, does not want help w this If attempted suicide, did drugs/alcohol play a role in this?: No Alcohol/Substance Abuse Treatment Hx: Denies past history Has alcohol/substance abuse ever caused legal problems?: No  Social Support System:   Conservation officer, natureatient's Community Support System: Fair Museum/gallery exhibitions officerDescribe Community Support  System: family and some friends, gets together w family twice/week Type of faith/religion: "I believe in God, its important to me" How does patient's faith help to cope with current illness?: gratitude, thankfulness daily  Leisure/Recreation:   Leisure and Hobbies: exercise, outdoor activities, running, biking, soccer, volleyball  Strengths/Needs:   What things does the patient do well?: cleaning, "I am like a therapist to everyone, I make sure everyone is OK" In what areas does patient struggle / problems for patient: "I have no struggles  Discharge Plan:   Does patient have access to transportation?: Yes (family or "the police will bring me home) Will patient be returning to same living situation after discharge?: Yes Currently receiving community mental health services: Yes (From Whom) (states she has a Therapist, sports at [place] at [facility], declines all other referrals) If no, would patient like referral for services when discharged?: No (will return to [facility] provider) Does patient have financial barriers related to discharge medications?: No  Summary/Recommendations:   Summary and Recommendations (to be completed by the evaluator): Patient is a 25 year old female, brought in by [employer] and admitted involuntarily.  Prior history of Bipolar 1 Disorder, Schizohrenia, ADHD, Anxiety and polysubstance abuse.  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  States her goal for hospitalization is medication stabilization after a "manic episode."  Currently receives medications management from [place] [facility].  Reports strong family support.  Patient wil benefit from hospitalization for crisis stabilization, medication adjustment, group psychotherapy and psychoeducation.  Discharge case management will asssit w aftercare referrals.  Goals of hospitalization include mood stability, appropriate medication regimen, increased coping skills to support wellness/recovery.    Sallee Lange. 09/25/2015

## 2015-09-25 NOTE — Plan of Care (Signed)
Problem: Safety: Goal: Ability to remain free from injury will improve Outcome: Progressing Client is safe on the unit AEB [id number] safety checks, client visible on unit, no self-harm behaviors observed or expressed.

## 2015-09-26 ENCOUNTER — Encounter (HOSPITAL_COMMUNITY): Payer: Self-pay

## 2015-09-26 DIAGNOSIS — F17210 Nicotine dependence, cigarettes, uncomplicated: Secondary | ICD-10-CM | POA: Insufficient documentation

## 2015-09-26 DIAGNOSIS — Z5321 Procedure and treatment not carried out due to patient leaving prior to being seen by health care provider: Secondary | ICD-10-CM | POA: Insufficient documentation

## 2015-09-26 DIAGNOSIS — Z791 Long term (current) use of non-steroidal anti-inflammatories (NSAID): Secondary | ICD-10-CM | POA: Insufficient documentation

## 2015-09-26 DIAGNOSIS — M549 Dorsalgia, unspecified: Secondary | ICD-10-CM | POA: Insufficient documentation

## 2015-09-26 DIAGNOSIS — Z79899 Other long term (current) drug therapy: Secondary | ICD-10-CM

## 2015-09-26 LAB — LIPID PANEL
CHOLESTEROL: 139 mg/dL (ref 0–200)
HDL: 56 mg/dL (ref >40)
LDL CALC: 65 mg/dL (ref 0–99)
TRIGLYCERIDES: 88 mg/dL (ref <150)
Total CHOL/HDL Ratio: 2.5 RATIO
VLDL: 18 mg/dL (ref 0–40)

## 2015-09-26 LAB — TSH: TSH: 0.907 u[IU]/mL (ref 0.350–4.500)

## 2015-09-26 MED ORDER — DIVALPROEX SODIUM 500 MG PO DR TAB
500.0000 mg | DELAYED_RELEASE_TABLET | Freq: Two times a day (BID) | ORAL | Status: DC
  Administered 2015-09-26 – 2015-09-30 (×9): 500 mg via ORAL
  Filled 2015-09-26 (×14): qty 1

## 2015-09-26 MED ORDER — ARIPIPRAZOLE 10 MG PO TABS
10.0000 mg | ORAL_TABLET | Freq: Every day | ORAL | Status: DC
  Administered 2015-09-26: 10 mg via ORAL
  Filled 2015-09-26 (×3): qty 1

## 2015-09-26 MED ORDER — IBUPROFEN 800 MG PO TABS
800.0000 mg | ORAL_TABLET | Freq: Four times a day (QID) | ORAL | Status: DC | PRN
  Administered 2015-09-27 – 2015-09-29 (×4): 800 mg via ORAL
  Filled 2015-09-26 (×4): qty 1

## 2015-09-26 MED ORDER — IBUPROFEN 800 MG PO TABS: 800.0000 mg | ORAL_TABLET | Freq: Three times a day (TID) | ORAL | 0 refills | Status: DC | PRN

## 2015-09-26 NOTE — Discharge Instructions (Signed)
Return here as needed. Ice and heat to your back
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
## 2015-09-26 NOTE — Progress Notes (Signed)
The focus of this group is to help patients review their daily goal of treatment and discuss progress on daily workbooks.  Patient attended group and participated. Patient states that her goal was to get discharged. Patient states that she is still here so she didn't accomplish her goal. Patient states that she will be getting released tomorrow and that her day was a 7/10.

## 2015-09-26 NOTE — ED Triage Notes (Signed)
[facility] and is under IVC papers. Pt is c/o back pain. Pt reports she believes she might have slept on her back wrong. Hx of back pain.

## 2015-09-26 NOTE — ED Notes (Signed)
Bed: WA27 Expected date:  Expected time:  Means of arrival:  Comments: 
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
## 2015-09-26 NOTE — Progress Notes (Signed)
While Pt was in the cafeteria Pt proceeded to raise her right fist and shout "white power" out loudly. This was offensive to some of the other Pts.

## 2015-09-26 NOTE — BHH Counselor (Signed)
Yesterday, pt refused consent for me to talk to boyfriend.  Today she gave permission, and we called together to Schuyler Amorharles Hicks at [phone], whom she was convinced would say he wanted her home and would come pick her up today.  He was very clear that he is concerned about "your mental health; you are acting crazy" and that he felt she needed to be here and would not be picking her up today.  She then wanted to call her "husband" and dialed that number, but no one answered.  During these calls, she continued to make her case to me about why she is ready to be discharged and needs to be discharged this AM. Her arguement was that nothing is wrong with her and other people believe that she is fine, including her mother Donn Pierini[the person who IVC'd her].

## 2015-09-26 NOTE — ED Notes (Signed)
Bed: WTR6 Expected date:  Expected time:  Means of arrival:  Comments: 

## 2015-09-26 NOTE — ED Triage Notes (Signed)
[facility] Pt transported via [facility] Report given to Crista R$N
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
## 2015-09-26 NOTE — ED Provider Notes (Signed)
[facility]-EMERGENCY DEPT Provider Note   CSN: [id number] Arrival date & time: 09/26/15  1104  By signing my name below, I, Aggie Moats, attest that this documentation has been prepared under the direction and in the presence of [employer], PA-C. Electronically signed by: Aggie Moats, ED Scribe. 09/26/15. 12:24 PM.   History   Chief Complaint Chief Complaint  Patient presents with  . Back Pain   The history is provided by the patient. No language interpreter was used.   HPI Comments:  [name] is a 25 y.o. female who presents to the Emergency Department complaining of constant, moderate lower back pain, which started a couple days ago. She believes she twisted her back and pain is characterized as "knots." No associated symptoms or modifying factors reported. Denies recent injury. Pt was seen doing lunges in waiting room.    Past Medical History:  Diagnosis Date  . Anxiety   . Atrial septal defect    corrected 1999  . Bipolar disorder (HCC)   . Depression   . Heart murmur   . Mental health problem   . Self-mutilation   . Substance abuse     Patient Active Problem List   Diagnosis Date Noted  . Bipolar disorder, current episode manic w/o psychotic features, severe (HCC) 09/25/2015  . Cocaine use disorder, mild, abuse 09/25/2015  . Cannabis use disorder, severe, dependence (HCC) 09/25/2015  . Tobacco use disorder 09/25/2015  . Opioid use disorder, moderate, dependence (HCC) 09/25/2015  . Attention deficit disorder 05/01/2014  . Chronic back pain 03/05/2013  . BMI 27.0-27.9,adult 03/05/2013    Past Surgical History:  Procedure Laterality Date  . ASD REPAIR      OB History    No data available       Home Medications    Prior to Admission medications   Medication Sig Start Date End Date Taking? Authorizing Provider  ziprasidone (GEODON) 20 MG capsule Take 20 mg by mouth 2 (two) times daily with a meal.   Yes Historical Provider, MD  mirtazapine (REMERON) 30  MG tablet TAKE 1 TABLET(30 MG) BY MOUTH AT BEDTIME Patient not taking: Reported on 09/24/2015 07/24/15   Tonye Pearson, MD  traZODone (DESYREL) 100 MG tablet Take 1 tablet (100 mg total) by mouth at bedtime. Patient not taking: Reported on 09/24/2015 07/20/15   Talmage Nap, NP    Family History Family History  Problem Relation Age of Onset  . Cancer Mother     breast - both  . Arthritis Maternal Grandmother     rheumatoid  . Cancer Maternal Grandfather     brain  . Alzheimer's disease Paternal Grandmother   . Mental illness Neg Hx   . Drug abuse Neg Hx   . Suicidality Neg Hx     Social History Social History  Substance Use Topics  . Smoking status: Current Every Day Smoker    Packs/day: 2.00    Years: 7.00    Types: Cigarettes  . Smokeless tobacco: Never Used  . Alcohol use Yes     Comment: a few a week     Allergies   Review of patient's allergies indicates no known allergies.   Review of Systems Review of Systems  Musculoskeletal: Positive for arthralgias, back pain and myalgias.  All other systems reviewed and are negative.    Physical Exam Updated Vital Signs BP 110/69   Pulse 83   Temp 98.2 F (36.8 C) (Oral)   Resp 20   Ht  5' 10.5" (1.791 m)   Wt 157 lb 8 oz (71.4 kg)   LMP 09/19/2015 (Approximate)   BMI 22.28 kg/m   Physical Exam  Constitutional: She is oriented to person, place, and time. She appears well-developed and well-nourished.  HENT:  Head: Normocephalic and atraumatic.  Mouth/Throat: Oropharynx is clear and moist.  Eyes: Pupils are equal, round, and reactive to light.  Neck: Normal range of motion.  Cardiovascular: Normal rate, regular rhythm and normal heart sounds.   Pulmonary/Chest: Effort normal and breath sounds normal.  Musculoskeletal: Normal range of motion. She exhibits tenderness.  Palpable tenderness along lateral musculature in lower lumbar region. No midline tenderness. Normal gait.  Neurological: She is alert  and oriented to person, place, and time.  Skin: Skin is warm and dry.  Psychiatric: She has a normal mood and affect.  Nursing note and vitals reviewed.    ED Treatments / Results  DIAGNOSTIC STUDIES:  COORDINATION OF CARE:  12:19 PM Discussed treatment plan with pt at bedside and pt agreed to plan.  Labs (all labs ordered are listed, but only abnormal results are displayed) Labs Reviewed  TSH  LIPID PANEL  HEMOGLOBIN A1C  PROLACTIN    EKG  EKG Interpretation None       Radiology No results found.  Procedures Procedures (including critical care time)  Medications Ordered in ED Medications  cloNIDine (CATAPRES) tablet 0.1 mg (0.1 mg Oral Given 09/25/15 2153)    Followed by  cloNIDine (CATAPRES) tablet 0.1 mg (0.1 mg Oral Given 09/26/15 0719)    Followed by  cloNIDine (CATAPRES) tablet 0.1 mg (not administered)  dicyclomine (BENTYL) tablet 20 mg (not administered)  hydrOXYzine (ATARAX/VISTARIL) tablet 25 mg (25 mg Oral Given 09/26/15 0357)  loperamide (IMODIUM) capsule 2-4 mg (not administered)  methocarbamol (ROBAXIN) tablet 500 mg (not administered)  ondansetron (ZOFRAN-ODT) disintegrating tablet 4 mg (not administered)  magnesium hydroxide (MILK OF MAGNESIA) suspension 30 mL (not administered)  acetaminophen (TYLENOL) tablet 650 mg (650 mg Oral Given 09/26/15 0834)  alum & mag hydroxide-simeth (MAALOX/MYLANTA) 200-200-20 MG/5ML suspension 30 mL (not administered)  ibuprofen (ADVIL,MOTRIN) tablet 600 mg (600 mg Oral Given 09/25/15 2122)  gabapentin (NEURONTIN) capsule 300 mg (300 mg Oral Given 09/26/15 0719)  nicotine polacrilex (NICORETTE) gum 2 mg (2 mg Oral Given 09/25/15 1539)  risperiDONE (RISPERDAL M-TABS) disintegrating tablet 2 mg (2 mg Oral Given 09/24/15 1607)    And  LORazepam (ATIVAN) tablet 1 mg (1 mg Oral Given 09/24/15 1608)    And  ziprasidone (GEODON) injection 20 mg (not administered)  traZODone (DESYREL) tablet 100 mg (100 mg Oral Given 09/25/15  2153)  ARIPiprazole ER SUSR 400 mg (not administered)  ARIPiprazole (ABILIFY) tablet 10 mg (not administered)  divalproex (DEPAKOTE) DR tablet 500 mg (not administered)     Initial Impression / Assessment and Plan / ED Course  I have reviewed the triage vital signs and the nursing notes.  Pertinent labs & imaging results that were available during my care of the patient were reviewed by me and considered in my medical decision making (see chart for details).  Clinical Course    Patient will be discharged back to behavioral health.  She feels fine at this time  Final Clinical Impressions(s) / ED Diagnoses   Final diagnoses:  None    New Prescriptions New Prescriptions   No medications on file      Charlestine NightChristopher Amai Cappiello, PA-C 10/10/15 0106    Derwood KaplanAnkit Nanavati, MD 10/10/15 [phone]
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
## 2015-09-27 LAB — HEMOGLOBIN A1C
Hgb A1c MFr Bld: 5.2 % (ref 4.8–5.6)
Mean Plasma Glucose: 103 mg/dL

## 2015-09-27 LAB — PROLACTIN: PROLACTIN: 32.7 ng/mL — AB (ref 4.8–23.3)

## 2015-09-27 MED ORDER — ARIPIPRAZOLE 15 MG PO TABS
15.0000 mg | ORAL_TABLET | Freq: Every day | ORAL | Status: DC
  Administered 2015-09-27 – 2015-09-29 (×3): 15 mg via ORAL
  Filled 2015-09-27 (×6): qty 1

## 2015-09-27 MED ORDER — ARIPIPRAZOLE ER 400 MG IM SUSR
400.0000 mg | INTRAMUSCULAR | Status: DC
  Administered 2015-09-29: 400 mg via INTRAMUSCULAR

## 2015-09-27 NOTE — Progress Notes (Signed)
Adult Psychoeducational Group Note  Date:  09/27/2015 Time:  9:55 PM  Group Topic/Focus:  Wrap-Up Group:   The focus of this group is to help patients review their daily goal of treatment and discuss progress on daily workbooks.   Participation Level:  Active  Participation Quality:  Intrusive  Affect:  Labile  Cognitive:  Oriented  Insight: Lacking  Engagement in Group:  Limited  Modes of Intervention:  Socialization and Support  Additional Comments:  Patient attended and participated in group tonight. She reports having a good day. She stayed up all day today except for a 45min nap because she want to sleep tonight. She has been working on her D/C plan.  Lita MainsFrancis, Else Habermann [facility] 09/27/2015, 9:55 PM
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
## 2015-09-27 NOTE — Tx Team (Signed)
Interdisciplinary Treatment and Diagnostic Plan Update  09/27/2015 Time of Session: 12:51 PM  CICILY BONANO MRN: [id number]  Principal Diagnosis: Bipolar disorder, current episode manic w/o psychotic features, severe ([place])  Secondary Diagnoses: Principal Problem:   Bipolar disorder, current episode manic w/o psychotic features, severe (HCC) Active Problems:   Cocaine use disorder, mild, abuse   Cannabis use disorder, severe, dependence ([place])   Tobacco use disorder   Opioid use disorder, moderate, dependence ([place])   Current Medications:  Current Facility-Administered Medications  Medication Dose Route Frequency Provider Last Rate Last Dose  . acetaminophen (TYLENOL) tablet 650 mg  650 mg Oral Q4H PRN Patrecia Pour, NP   650 mg at 09/26/15 0834  . alum & mag hydroxide-simeth (MAALOX/MYLANTA) 200-200-20 MG/5ML suspension 30 mL  30 mL Oral PRN Patrecia Pour, NP      . ARIPiprazole (ABILIFY) tablet 15 mg  15 mg Oral QHS Ursula Alert, MD      . Derrill Memo ON 09/29/2015] ARIPiprazole ER SUSR 400 mg  400 mg Intramuscular Q28 days Ursula Alert, MD      . cloNIDine (CATAPRES) tablet 0.1 mg  0.1 mg Oral BH-qamhs Patrecia Pour, NP   0.1 mg at 09/27/15 0814   Followed by  . [START ON 09/28/2015] cloNIDine (CATAPRES) tablet 0.1 mg  0.1 mg Oral QAC breakfast Patrecia Pour, NP      . dicyclomine (BENTYL) tablet 20 mg  20 mg Oral Q6H PRN Patrecia Pour, NP      . divalproex (DEPAKOTE) DR tablet 500 mg  500 mg Oral Q12H Ursula Alert, MD   500 mg at 09/27/15 0814  . gabapentin (NEURONTIN) capsule 300 mg  300 mg Oral TID Patrecia Pour, NP   300 mg at 09/27/15 1208  . hydrOXYzine (ATARAX/VISTARIL) tablet 25 mg  25 mg Oral Q6H PRN Patrecia Pour, NP   25 mg at 09/27/15 0816  . ibuprofen (ADVIL,MOTRIN) tablet 800 mg  800 mg Oral Q6H PRN Ursula Alert, MD   800 mg at 09/27/15 0048  . loperamide (IMODIUM) capsule 2-4 mg  2-4 mg Oral PRN Patrecia Pour, NP      . magnesium hydroxide (MILK OF MAGNESIA)  suspension 30 mL  30 mL Oral Daily PRN Patrecia Pour, NP      . methocarbamol (ROBAXIN) tablet 500 mg  500 mg Oral Q8H PRN Patrecia Pour, NP   500 mg at 09/26/15 1942  . nicotine polacrilex (NICORETTE) gum 2 mg  2 mg Oral PRN Ursula Alert, MD   2 mg at 09/27/15 1209  . ondansetron (ZOFRAN-ODT) disintegrating tablet 4 mg  4 mg Oral Q6H PRN Patrecia Pour, NP      . risperiDONE (RISPERDAL M-TABS) disintegrating tablet 2 mg  2 mg Oral Q8H PRN Kerrie Buffalo, NP   2 mg at 09/27/15 0048   And  . ziprasidone (GEODON) injection 20 mg  20 mg Intramuscular PRN Kerrie Buffalo, NP      . traZODone (DESYREL) tablet 100 mg  100 mg Oral QHS PRN Ursula Alert, MD   100 mg at 09/26/15 2055    PTA Medications: Prescriptions Prior to Admission  Medication Sig Dispense Refill Last Dose  . ziprasidone (GEODON) 20 MG capsule Take 20 mg by mouth 2 (two) times daily with a meal.   09/23/2015 at Unknown time  . mirtazapine (REMERON) 30 MG tablet TAKE 1 TABLET(30 MG) BY MOUTH AT BEDTIME (Patient not taking: Reported on 09/24/2015) 30  tablet 0 Not Taking at Unknown time  . traZODone (DESYREL) 100 MG tablet Take 1 tablet (100 mg total) by mouth at bedtime. (Patient not taking: Reported on 09/24/2015) 30 tablet 0 Not Taking at Unknown time    Treatment Modalities: Medication Management, Group therapy, Case management,  1 to 1 session with clinician, Psychoeducation, Recreational therapy.   Physician Treatment Plan for Primary Diagnosis: Bipolar disorder, current episode manic w/o psychotic features, severe ([place]) Long Term Goal(s): Improvement in symptoms so as ready for discharge  Short Term Goals: Ability to demonstrate self-control will improve  Medication Management: Evaluate patient's response, side effects, and tolerance of medication regimen.  Therapeutic Interventions: 1 to 1 sessions, Unit Group sessions and Medication administration.  Evaluation of Outcomes: Not Met  Physician Treatment Plan for  Secondary Diagnosis: Principal Problem:   Bipolar disorder, current episode manic w/o psychotic features, severe (HCC) Active Problems:   Cocaine use disorder, mild, abuse   Cannabis use disorder, severe, dependence ([place])   Tobacco use disorder   Opioid use disorder, moderate, dependence ([place])   Long Term Goal(s): Improvement in symptoms so as ready for discharge  Short Term Goals: Ability to identify triggers associated with substance abuse/mental health issues will improve  Medication Management: Evaluate patient's response, side effects, and tolerance of medication regimen.  Therapeutic Interventions: 1 to 1 sessions, Unit Group sessions and Medication administration.  Evaluation of Outcomes: Not Met   RN Treatment Plan for Primary Diagnosis: Bipolar disorder, current episode manic w/o psychotic features, severe ([place]) Long Term Goal(s): Knowledge of disease and therapeutic regimen to maintain health will improve  Short Term Goals: Ability to demonstrate self-control and Compliance with prescribed medications will improve  Medication Management: RN will administer medications as ordered by provider, will assess and evaluate patient's response and provide education to patient for prescribed medication. RN will report any adverse and/or side effects to prescribing provider.  Therapeutic Interventions: 1 on 1 counseling sessions, Psychoeducation, Medication administration, Evaluate responses to treatment, Monitor vital signs and CBGs as ordered, Perform/monitor CIWA, COWS, AIMS and Fall Risk screenings as ordered, Perform wound care treatments as ordered.  Evaluation of Outcomes: Not Met   LCSW Treatment Plan for Primary Diagnosis: Bipolar disorder, current episode manic w/o psychotic features, severe ([place]) Long Term Goal(s): Safe transition to appropriate next level of care at discharge, Engage patient in therapeutic group addressing interpersonal concerns.  Short Term Goals:  Engage patient in aftercare planning with referrals and resources, Increase emotional regulation, Facilitate patient progression through stages of change regarding substance use diagnoses and concerns and Identify triggers associated with mental health/substance abuse issues  Therapeutic Interventions: Assess for all discharge needs, 1 to 1 time with Social worker, Explore available resources and support systems, Assess for adequacy in community support network, Educate family and significant other(s) on suicide prevention, Complete Psychosocial Assessment, Interpersonal group therapy.  Evaluation of Outcomes: Not Met   Progress in Treatment: Attending groups: Yes Participating in groups: Yes Taking medication as prescribed: Yes Toleration medication: Yes, no side effects reported at this time Family/Significant other contact made:  Patient understands diagnosis:No  Limited insight Discussing patient identified problems/goals with staff: Yes Medical problems stabilized or resolved: Yes Denies suicidal/homicidal ideation: Yes Issues/concerns per patient self-inventory: None Other: N/A  New problem(s) identified: None identified at this time.   New Short Term/Long Term Goal(s): None identified at this time.   Discharge Plan or Barriers: states she will return home, follow up with [facility] program  Has been offered  referral to rehab, refusing  Reason for Continuation of Hospitalization: Substance Abuse Mania Medication stabilization   Estimated Length of Stay: 3-5 days  Attendees: Patient: 09/27/2015  12:51 PM  Physician: Ursula Alert, MD 09/27/2015  12:51 PM  Nursing: Hoy Register, RN 09/27/2015  12:51 PM  RN Care Manager: Lars Pinks, RN 09/27/2015  12:51 PM  Social Worker: Ripley Fraise 09/27/2015  12:51 PM  Recreational Therapist: Laretta Bolster  09/27/2015  12:51 PM  Other: Norberto Sorenson 09/27/2015  12:51 PM  Other:  09/27/2015  12:51 PM    Scribe for Treatment Team:  Roque Lias 09/27/2015 12:51 PM
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
## 2015-09-27 NOTE — Progress Notes (Signed)
Patient ID: Ok AnisLauren E Quaranta, female   DOB: 12/20/1990, 25 y.o.   MRN: [id number]   Pt currently presents with an anxious affect and impulsive behavior. Pt reports to writer that their goal is to "get discharged." Pt states "I am ready to go home." Pt reports good sleep with current medication regimen. Reports periodic anxiety. Seen on the phone in the dayroom tonight, pt adheres to medication regimen. Reports a nicotine craving.  Pt provided with medications per providers orders. Pt's labs and vitals were monitored throughout the night. Pt supported emotionally and encouraged to express concerns and questions. Pt educated on medications.   Pt's safety ensured with 15 minute and environmental checks. Pt currently denies SI/HI and A/V hallucinations. Pt verbally agrees to seek staff if SI/HI or A/VH occurs and to consult with staff before acting on any harmful thoughts. Will continue POC.

## 2015-09-27 NOTE — Progress Notes (Signed)
Recreation Therapy Notes  Date: 09/27/15 Time: 1000 Location: 500 Hall Dayroom  Group Topic: Stress Management  Goal Area(s) Addresses:  Patient will verbalize importance of using healthy stress management.  Patient will identify positive emotions associated with healthy stress management.   Behavioral Response: Engaged  Intervention: Stress Management  Activity :  Progressive Muscle Relaxation, Guided Imagery Script.  LRT introduced the stress management techniques of progressive muscle relaxation and guided imagery.  LRT read scripts that guided patients in how to perform the techniques.  Patients were to follow along as LRT read the scripts to engage in the techniques.  Education:  Stress Management, Discharge Planning.   Education Outcome: Acknowledges edcuation/In group clarification offered/Needs additional education  Clinical Observations/Feedback: Pt expressed that stress can make you feel anxious, worried and uneasy.  Pt stated that she deals with stress by going for a walk, doing yoga or some other form of exercise.  Pt was fully engaged and active during group.   Caroll RancherMarjette Daleysa Kristiansen, LRT/CTRS    Caroll RancherLindsay, Brenton Joines A 09/27/2015 12:13 PM

## 2015-09-27 NOTE — Progress Notes (Signed)
DAR NOTE: Patient continues to show intrusive and manic behaviors on the unit.  Patient very hyperactive, irritable and constantly asking for different medications that is not prescribed to her.  Patient redirected several times for inappropriate behaviors.  Denies pain, auditory and visual hallucinations.  Rates depression at 0, hopelessness at 0, and anxiety at 0.  Maintained on routine safety checks.  Medications given as prescribed.  Support and encouragement offered as needed.  Patient attended group but was disruptive by going in and out during group session.  States goal for today is "to go home."  Patient observed socializing with peers in the dayroom.  Patient requested and received Vistaril 25 mg x 2 for anxiety with fair effect.

## 2015-09-27 NOTE — Progress Notes (Signed)
[facility] MD Progress Note  09/27/2015 12:25 PM [name]  MRN:  [id number] Subjective:  Patient states " I am fine. I want to go home . I want to sue the doctors who brought me in for no reason. I am an adult. I can do whatever I want to do. I want to go back and abuse drugs , you have to to let me do that. No one can force me to get help. I will never do that to my mother or brother. I want a shot that will let me sleep till Monday if you are planning to keep me here till Monday. I keep abusing drugs since my boyfriend is an " Ass hole ."   Objective:Carolyn E Danielsis a 25 y.o.caucasian female , employed , who lives in [place] , has a hx of Bipolar disorder ,polysubstance abuse , presented  involuntarily unaccompanied BIB [facility] reporting symptoms of mania and bizarre behavior.  Patient seen and chart reviewed.Discussed patient with treatment team.  Patient today continues to be seen as manic, euphoric , and continues to have no insight . She continues to be pressured , tangential, hyperactive , and continues to impulsive and manipulative on the unit. Pt as per nursing has a hard time following directions on the unit and often disruptive in milieu. Pt also is manipulative on the unit - and as per staff gave her room mates code to her boyfriend , since she had visitor restrictions last PM , and apparently the BF came to the unit pretending to be her roommate's visitor. Pt hence will continue to need visitor restrictions. Will continue treatment.   Principal Problem: Bipolar disorder, current episode manic w/o psychotic features, severe (HCC) Diagnosis:   Patient Active Problem List   Diagnosis Date Noted  . Bipolar disorder, current episode manic w/o psychotic features, severe (HCC) [F31.13] 09/25/2015  . Cocaine use disorder, mild, abuse [F14.10] 09/25/2015  . Cannabis use disorder, severe, dependence (HCC) [F12.20] 09/25/2015  . Tobacco use disorder [F17.200] 09/25/2015  . Opioid use  disorder, moderate, dependence (HCC) [F11.20] 09/25/2015  . Attention deficit disorder [F90.9] 05/01/2014  . Chronic back pain [M54.9, G89.29] 03/05/2013  . BMI 27.0-27.9,adult [Z68.27] 03/05/2013   Total Time spent with patient: 25 minutes  Past Psychiatric History: Please see H&P.   Past Medical History:  Past Medical History:  Diagnosis Date  . Anxiety   . Atrial septal defect    corrected 1999  . Bipolar disorder (HCC)   . Depression   . Heart murmur   . Mental health problem   . Self-mutilation   . Substance abuse     Past Surgical History:  Procedure Laterality Date  . ASD REPAIR     Family History:  Family History  Problem Relation Age of Onset  . Cancer Mother     breast - both  . Arthritis Maternal Grandmother     rheumatoid  . Cancer Maternal Grandfather     brain  . Alzheimer's disease Paternal Grandmother   . Mental illness Neg Hx   . Drug abuse Neg Hx   . Suicidality Neg Hx    Family Psychiatric  History: Please see H&P.  Social History: Please see H&P.  History  Alcohol Use  . Yes    Comment: a few a week     History  Drug use: Unknown  . Frequency: 3.0 times per week  . Types: Marijuana, Cocaine    Comment: States no per pt  Social History   Social History  . Marital status: Single    Spouse name: N/A  . Number of children: N/A  . Years of education: N/A   Social History Main Topics  . Smoking status: Current Every Day Smoker    Packs/day: 2.00    Years: 7.00    Types: Cigarettes  . Smokeless tobacco: Never Used  . Alcohol use Yes     Comment: a few a week  . Drug use: Unknown    Frequency: 3.0 times per week    Types: Marijuana, Cocaine     Comment: States no per pt  . Sexual activity: Yes    Birth control/ protection: None     Comment: number of sex partners in the last 12 months 1   Other Topics Concern  . None   Social History Narrative  . None   Additional Social History:                          Sleep: Fair  Appetite:  Fair  Current Medications: Current Facility-Administered Medications  Medication Dose Route Frequency Provider Last Rate Last Dose  . acetaminophen (TYLENOL) tablet 650 mg  650 mg Oral Q4H PRN Charm Rings, NP   650 mg at 09/26/15 0834  . alum & mag hydroxide-simeth (MAALOX/MYLANTA) 200-200-20 MG/5ML suspension 30 mL  30 mL Oral PRN Charm Rings, NP      . ARIPiprazole (ABILIFY) tablet 15 mg  15 mg Oral QHS Jomarie Longs, MD      . Melene Muller ON 09/29/2015] ARIPiprazole ER SUSR 400 mg  400 mg Intramuscular Q28 days Jomarie Longs, MD      . cloNIDine (CATAPRES) tablet 0.1 mg  0.1 mg Oral BH-qamhs Charm Rings, NP   0.1 mg at 09/27/15 9604   Followed by  . [START ON 09/28/2015] cloNIDine (CATAPRES) tablet 0.1 mg  0.1 mg Oral QAC breakfast Charm Rings, NP      . dicyclomine (BENTYL) tablet 20 mg  20 mg Oral Q6H PRN Charm Rings, NP      . divalproex (DEPAKOTE) DR tablet 500 mg  500 mg Oral Q12H Jomarie Longs, MD   500 mg at 09/27/15 0814  . gabapentin (NEURONTIN) capsule 300 mg  300 mg Oral TID Charm Rings, NP   300 mg at 09/27/15 1208  . hydrOXYzine (ATARAX/VISTARIL) tablet 25 mg  25 mg Oral Q6H PRN Charm Rings, NP   25 mg at 09/27/15 0816  . ibuprofen (ADVIL,MOTRIN) tablet 800 mg  800 mg Oral Q6H PRN Jomarie Longs, MD   800 mg at 09/27/15 0048  . loperamide (IMODIUM) capsule 2-4 mg  2-4 mg Oral PRN Charm Rings, NP      . magnesium hydroxide (MILK OF MAGNESIA) suspension 30 mL  30 mL Oral Daily PRN Charm Rings, NP      . methocarbamol (ROBAXIN) tablet 500 mg  500 mg Oral Q8H PRN Charm Rings, NP   500 mg at 09/26/15 1942  . nicotine polacrilex (NICORETTE) gum 2 mg  2 mg Oral PRN Jomarie Longs, MD   2 mg at 09/27/15 1209  . ondansetron (ZOFRAN-ODT) disintegrating tablet 4 mg  4 mg Oral Q6H PRN Charm Rings, NP      . risperiDONE (RISPERDAL M-TABS) disintegrating tablet 2 mg  2 mg Oral Q8H PRN Adonis Brook, NP   2 mg at 09/27/15 0048   And   .  ziprasidone (GEODON) injection 20 mg  20 mg Intramuscular PRN Adonis Brook, NP      . traZODone (DESYREL) tablet 100 mg  100 mg Oral QHS PRN Jomarie Longs, MD   100 mg at 09/26/15 2055    Lab Results:  Results for orders placed or performed during the hospital encounter of 09/24/15 (from the past 48 hour(s))  TSH     Status: None   Collection Time: 09/26/15  6:34 AM  Result Value Ref Range   TSH 0.907 0.350 - 4.500 uIU/mL    Comment: Performed at [facility]  Lipid panel     Status: None   Collection Time: 09/26/15  6:34 AM  Result Value Ref Range   Cholesterol 139 0 - 200 mg/dL   Triglycerides 88 <604 mg/dL   HDL 56 >54 mg/dL   Total CHOL/HDL Ratio 2.5 RATIO   VLDL 18 0 - 40 mg/dL   LDL Cholesterol 65 0 - 99 mg/dL    Comment:        Total Cholesterol/HDL:CHD Risk Coronary Heart Disease Risk Table                     Men   Women  1/2 Average Risk   3.4   3.3  Average Risk       5.0   4.4  2 X Average Risk   9.6   7.1  3 X Average Risk  23.4   11.0        Use the calculated Patient Ratio above and the CHD Risk Table to determine the patient's CHD Risk.        ATP III CLASSIFICATION (LDL):  <100     mg/dL   Optimal  098-119  mg/dL   Near or Above                    Optimal  130-159  mg/dL   Borderline  147-829  mg/dL   High  >562     mg/dL   Very High Performed at [facility]   Hemoglobin A1c     Status: None   Collection Time: 09/26/15  6:34 AM  Result Value Ref Range   Hgb A1c MFr Bld 5.2 4.8 - 5.6 %    Comment: (NOTE)         Pre-diabetes: 5.7 - 6.4         Diabetes: >6.4         Glycemic control for adults with diabetes: <7.0    Mean Plasma Glucose 103 mg/dL    Comment: (NOTE) Performed At: [facility] [address] [place], [place] [id number] Mila Homer MD ON:[id number] Performed at [facility]   Prolactin     Status: Abnormal   Collection Time: 09/26/15  6:34 AM  Result Value Ref Range    Prolactin 32.7 (H) 4.8 - 23.3 ng/mL    Comment: (NOTE) Performed At: [facility] [address] [place], [place] [id number] Mila Homer MD DG:[id number] Performed at [facility]     Blood Alcohol level:  Lab Results  Component Value Date   ETH 17 (H) 09/24/2015   ETH <5 07/19/2015    Metabolic Disorder Labs: Lab Results  Component Value Date   HGBA1C 5.2 09/26/2015   MPG 103 09/26/2015   Lab Results  Component Value Date   PROLACTIN 32.7 (H) 09/26/2015   Lab Results  Component Value Date  CHOL 139 09/26/2015   TRIG 88 09/26/2015   HDL 56 09/26/2015   CHOLHDL 2.5 09/26/2015   VLDL 18 09/26/2015   LDLCALC 65 09/26/2015    Physical Findings: AIMS: Facial and Oral Movements Muscles of Facial Expression: None, normal Lips and Perioral Area: None, normal Jaw: None, normal Tongue: None, normal,Extremity Movements Upper (arms, wrists, hands, fingers): None, normal Lower (legs, knees, ankles, toes): None, normal, Trunk Movements Neck, shoulders, hips: None, normal, Overall Severity Severity of abnormal movements (highest score from questions above): None, normal Incapacitation due to abnormal movements: None, normal Patient's awareness of abnormal movements (rate only patient's report): No Awareness, Dental Status Current problems with teeth and/or dentures?: No Does patient usually wear dentures?: No  CIWA:    COWS:  COWS Total Score: 5  Musculoskeletal: Strength & Muscle Tone: within normal limits Gait & Station: normal Patient leans: N/A  Psychiatric Specialty Exam: Physical Exam  Nursing note and vitals reviewed.   Review of Systems  Musculoskeletal: Positive for back pain.  Psychiatric/Behavioral: The patient is nervous/anxious.   All other systems reviewed and are negative.   Blood pressure 95/66, pulse (!) 56, temperature 97.9 F (36.6 C), temperature source Oral, resp. rate 16, height 5' 10.5" (1.791 m), weight  71.4 kg (157 lb 8 oz), last menstrual period 09/19/2015, SpO2 100 %.Body mass index is 22.28 kg/m.  General Appearance: Disheveled  Eye Contact:  Fair  Speech:  Pressured  Volume:  Increased  Mood:  Euphoric  Affect:  Labile and irritable  Thought Process:  Irrelevant and Descriptions of Associations: Tangential  Orientation:  Full (Time, Place, and Person)  Thought Content:  Rumination and Tangential  Suicidal Thoughts:  denies - but is manic, euphoric , impulsive and intrusive - potential danger to self or others - attempted to jump over a moving car prior to admission  Homicidal Thoughts:  No  Memory:  Immediate;   Fair Recent;   Fair Remote;   Fair  Judgement:  Impaired  Insight:  Shallow  Psychomotor Activity:  Decreased and Restlessness  Concentration:  Concentration: Poor and Attention Span: Fair  Recall:  [employer]  Fund of Knowledge:  Fair  Language:  Fair  Akathisia:  No  Handed:  Right  AIMS (if indicated):     Assets:  Physical Health Social Support Transportation  ADL's:  Intact  Cognition:  WNL  Sleep:  Number of Hours: 4.25   09/26/15 Mother Benjaman Kindlerlaine Brawn at [phone] - was contacted by Clinical research associatewriter. Mother had initiated the IVC petition leading to her admission. According to her mother - Pt has been in and out of IP hospitals since the age of 25 y. Pt had atleast 20 hospitalizations till now. Pt 8 weeks prior to admission was observed as manic, grandiose and disruptive and hence [facility] [place] . Pt was started on seroquel and released after a few days. Pt got out and stopped taking her medications. Pt later on became more and more manic, but was able to get in  [facility]. However , this Monday - [facility] felt patient was becoming more and more disorganized and disruptive and wanted her to be taken to an ED for evaluation. [facility] - very manic , after  jumping over a moving car.Per mother - she had also tried to get her in to a dualdiagnosis program in [place] , got her in  to a plane , but she checked out of the facility the very next day. This was couple of weeks ago. According to mother - based on her behavior outside prior to admission - pt is a potential danger to self or others and should not be released until she is more stable . Mother also wants her to get help with her substance abuse.  Treatment Plan Summary:Nayab E Danielsis a 25 y.o.caucasian female , employed , who lives in [place] , has a hx of Bipolar disorder ,polysubstance abuse , presented  involuntarily unaccompanied BIB [facility] reporting symptoms of mania and bizarre behavior. Pt continues to be manic and pressured , labile and intrusive, will continue treatment.    Daily contact with patient to assess and evaluate symptoms and progress in treatment and Medication management  Discontinued Lamictal for lack of efficacy. Will continue Depakote DR 500 mg po q12 h for mood lability. Depakote level - 09/30/15 Will increase Abilify to 15  mg po qhs for mood sx- with plan to offer Abilify Maintena 400 mg IM q28 days - first dose on 09/29/15. Will continue Gabapentin 300 mg po tid for anxiety sx/pain. Will continue Trazodone 100 mg po qhs prn for sleep. Will continue Advil 800 mg po q8h prn for pain. Will continue COWS/Clonidine protocol for opioid withdrawal sx. Will continue to monitor vitals ,medication compliance and treatment side effects while patient is here.  Will monitor for medical issues as well as call consult as needed.  Reviewed labs cbc - wnl, cmp - wnl, pregnancy test - negative, UDS- pos for thc, cocaine, opioid, BAL - 17 , EKG for qtc - wnl , tsh- wnl , lipid panel- wnl ,  hba1c- wnl , pl- 32.7  Slightly elevated - will continue to monitor . Provided substance abuse counseling - pt is [facility]. CSW will continue working on disposition. Pt  [facility] or an IP treatment program based on her preference. Patient to participate in therapeutic milieu .   Jameshia Hayashida, MD 09/27/2015, 12:25 PM

## 2015-09-28 DIAGNOSIS — F3113 Bipolar disorder, current episode manic without psychotic features, severe: Principal | ICD-10-CM

## 2015-09-28 NOTE — [facility] Group Notes (Signed)
[facility] LCSW Group Therapy  09/28/2015 / 11:00 AM  Type of Therapy:  Group Therapy  Participation Level:  Active  Participation Quality:  Attentive, Intrusive and Sharing  Affect:  Anxious and Excited  Cognitive:  Alert and Oriented  Insight:  Developing/Improving  Engagement in Therapy:  Developing/Improving and Monopolizing  Modes of Intervention:  Discussion, Exploration, Rapport Building, Socialization and Support   Summary of Progress/Problems: The main focus of today's process group was for the patient to identify ways in which they have in the past sabotaged their own recovery. Motivational Interviewing was utilized to ask the group members what they get out of their self sabotaging behavior(s), and what reasons they may have for wanting to change. Patient was often tangentiaL YET WITH SOME REDIRECTION WAS ABLE TO CATCH HERSELF AT TIMES. pATIENT REPORTS HER BIGGEST CHALLENGE IS TO avoid spelf medication to deal with anxiety as substance use has now become an issue in itself.   Clide DalesHarrill, Alekhya Gravlin Campbell

## 2015-09-28 NOTE — Plan of Care (Signed)
Problem: Safety: Goal: Ability to remain free from injury will improve Outcome: Progressing Pt remains safe at this time 09/28/15 3:02 AM

## 2015-09-28 NOTE — Progress Notes (Signed)
DAR NOTE: Patient presents with an anxious affect and continues to show impulsive behavior on the unit.  Denies pain, auditory and visual hallucinations.  Rates depression at 0, hopelessness at 0, and anxiety at 0.  Maintained on routine safety checks.  Medications given as prescribed.  Support and encouragement offered as needed.  Attended group and participated.  States goal for today is "to go home and also make a schedule to stick to."  Patient observed socializing with peers in the dayroom.  Patient requested and received Risperdal 2 mg for anxiety and agitation.  Patient educated about using other coping skills.  Patient states "I will use all my PRN just because they are available for me."

## 2015-09-28 NOTE — Progress Notes (Signed)
[facility] MD Progress Note  09/28/2015 5:14 PM NISSI DOFFING  MRN:  [id number]   Subjective:  Patient reports " all I did was jump on my boyfriends car and then I had to get stuck here."   Objective:Desa E Macaluso is awake, alert and oriented *4. Seen sitting in dayroom interacting well with staff and others.  Denies suicidal or homicidal ideation. Denies auditory or visual hallucination and does not appear to be responding to internal stimuli. Patient reports she is excited that her boyfriend will come and see her today during visitation. States she feels ready to leave. Patient interacts well with staff and others. Patient reports she is medication compliant without mediation side effects. Patient denies depression at this time. Reports good appetite and states she is resting well. Support, encouragement and reassurance was provided.    Principal Problem: Bipolar disorder, current episode manic w/o psychotic features, severe (HCC) Diagnosis:   Patient Active Problem List   Diagnosis Date Noted  . Bipolar disorder, current episode manic w/o psychotic features, severe (HCC) [F31.13] 09/25/2015  . Cocaine use disorder, mild, abuse [F14.10] 09/25/2015  . Cannabis use disorder, severe, dependence (HCC) [F12.20] 09/25/2015  . Tobacco use disorder [F17.200] 09/25/2015  . Opioid use disorder, moderate, dependence (HCC) [F11.20] 09/25/2015  . Attention deficit disorder [F90.9] 05/01/2014  . Chronic back pain [M54.9, G89.29] 03/05/2013  . BMI 27.0-27.9,adult [Z68.27] 03/05/2013   Total Time spent with patient: 25 minutes  Past Psychiatric History: Please see H&P.   Past Medical History:  Past Medical History:  Diagnosis Date  . Anxiety   . Atrial septal defect    corrected 1999  . Bipolar disorder (HCC)   . Depression   . Heart murmur   . Mental health problem   . Self-mutilation   . Substance abuse     Past Surgical History:  Procedure Laterality Date  . ASD REPAIR     Family  History:  Family History  Problem Relation Age of Onset  . Cancer Mother     breast - both  . Arthritis Maternal Grandmother     rheumatoid  . Cancer Maternal Grandfather     brain  . Alzheimer's disease Paternal Grandmother   . Mental illness Neg Hx   . Drug abuse Neg Hx   . Suicidality Neg Hx    Family Psychiatric  History: Please see H&P.  Social History: Please see H&P.  History  Alcohol Use  . Yes    Comment: a few a week     History  Drug use: Unknown  . Frequency: 3.0 times per week  . Types: Marijuana, Cocaine    Comment: States no per pt    Social History   Social History  . Marital status: Single    Spouse name: N/A  . Number of children: N/A  . Years of education: N/A   Social History Main Topics  . Smoking status: Current Every Day Smoker    Packs/day: 2.00    Years: 7.00    Types: Cigarettes  . Smokeless tobacco: Never Used  . Alcohol use Yes     Comment: a few a week  . Drug use: Unknown    Frequency: 3.0 times per week    Types: Marijuana, Cocaine     Comment: States no per pt  . Sexual activity: Yes    Birth control/ protection: None     Comment: number of sex partners in the last 12 months 1   Other Topics Concern  .  None   Social History Narrative  . None   Additional Social History:                         Sleep: Fair  Appetite:  Fair  Current Medications: Current Facility-Administered Medications  Medication Dose Route Frequency Provider Last Rate Last Dose  . acetaminophen (TYLENOL) tablet 650 mg  650 mg Oral Q4H PRN Charm Rings, NP   650 mg at 09/26/15 0834  . alum & mag hydroxide-simeth (MAALOX/MYLANTA) 200-200-20 MG/5ML suspension 30 mL  30 mL Oral PRN Charm Rings, NP      . ARIPiprazole (ABILIFY) tablet 15 mg  15 mg Oral QHS Jomarie Longs, MD   15 mg at 09/27/15 2114  . [START ON 09/29/2015] ARIPiprazole ER SUSR 400 mg  400 mg Intramuscular Q28 days Jomarie Longs, MD      . dicyclomine (BENTYL) tablet 20  mg  20 mg Oral Q6H PRN Charm Rings, NP      . divalproex (DEPAKOTE) DR tablet 500 mg  500 mg Oral Q12H Jomarie Longs, MD   500 mg at 09/28/15 0815  . gabapentin (NEURONTIN) capsule 300 mg  300 mg Oral TID Charm Rings, NP   300 mg at 09/28/15 1618  . hydrOXYzine (ATARAX/VISTARIL) tablet 25 mg  25 mg Oral Q6H PRN Charm Rings, NP   25 mg at 09/28/15 1451  . ibuprofen (ADVIL,MOTRIN) tablet 800 mg  800 mg Oral Q6H PRN Jomarie Longs, MD   800 mg at 09/27/15 1959  . loperamide (IMODIUM) capsule 2-4 mg  2-4 mg Oral PRN Charm Rings, NP      . magnesium hydroxide (MILK OF MAGNESIA) suspension 30 mL  30 mL Oral Daily PRN Charm Rings, NP      . methocarbamol (ROBAXIN) tablet 500 mg  500 mg Oral Q8H PRN Charm Rings, NP   500 mg at 09/28/15 1618  . nicotine polacrilex (NICORETTE) gum 2 mg  2 mg Oral PRN Jomarie Longs, MD   2 mg at 09/28/15 1701  . ondansetron (ZOFRAN-ODT) disintegrating tablet 4 mg  4 mg Oral Q6H PRN Charm Rings, NP      . risperiDONE (RISPERDAL M-TABS) disintegrating tablet 2 mg  2 mg Oral Q8H PRN Adonis Brook, NP   2 mg at 09/28/15 1051   And  . ziprasidone (GEODON) injection 20 mg  20 mg Intramuscular PRN Adonis Brook, NP      . traZODone (DESYREL) tablet 100 mg  100 mg Oral QHS PRN Jomarie Longs, MD   100 mg at 09/28/15 0024    Lab Results:  No results found for this or any previous visit (from the past 48 hour(s)).  Blood Alcohol level:  Lab Results  Component Value Date   ETH 17 (H) 09/24/2015   ETH <5 07/19/2015    Metabolic Disorder Labs: Lab Results  Component Value Date   HGBA1C 5.2 09/26/2015   MPG 103 09/26/2015   Lab Results  Component Value Date   PROLACTIN 32.7 (H) 09/26/2015   Lab Results  Component Value Date   CHOL 139 09/26/2015   TRIG 88 09/26/2015   HDL 56 09/26/2015   CHOLHDL 2.5 09/26/2015   VLDL 18 09/26/2015   LDLCALC 65 09/26/2015    Physical Findings: AIMS: Facial and Oral Movements Muscles of Facial  Expression: None, normal Lips and Perioral Area: None, normal Jaw: None, normal Tongue: None, normal,Extremity Movements Upper (arms,  wrists, hands, fingers): None, normal Lower (legs, knees, ankles, toes): None, normal, Trunk Movements Neck, shoulders, hips: None, normal, Overall Severity Severity of abnormal movements (highest score from questions above): None, normal Incapacitation due to abnormal movements: None, normal Patient's awareness of abnormal movements (rate only patient's report): No Awareness, Dental Status Current problems with teeth and/or dentures?: No Does patient usually wear dentures?: No  CIWA:    COWS:  COWS Total Score: 4  Musculoskeletal: Strength & Muscle Tone: within normal limits Gait & Station: normal Patient leans: N/A  Psychiatric Specialty Exam: Physical Exam  Nursing note and vitals reviewed. Constitutional: She is oriented to person, place, and time. She appears well-developed.  HENT:  Head: Normocephalic.  Musculoskeletal: Normal range of motion.  Neurological: She is alert and oriented to person, place, and time.  Psychiatric: She has a normal mood and affect. Her behavior is normal.    Review of Systems  Psychiatric/Behavioral: Positive for depression (stable). The patient is nervous/anxious.   All other systems reviewed and are negative.   Blood pressure 123/65, pulse 92, temperature 97.8 F (36.6 C), temperature source Oral, resp. rate 18, height 5' 10.5" (1.791 m), weight 71.4 kg (157 lb 8 oz), last menstrual period 09/19/2015, SpO2 100 %.Body mass index is 22.28 kg/m.  General Appearance: Casual  Eye Contact:  Good  Speech:  Pressured  Volume:  Increased  Mood:  Euphoric  Affect:  Labile and irritable  Thought Process:  Irrelevant and Descriptions of Associations: Tangential  Orientation:  Full (Time, Place, and Person)  Thought Content:  Rumination and Tangential  Suicidal Thoughts:  denies - but is manic, euphoric , impulsive  and intrusive - potential danger to self or others - attempted to jump over a moving car prior to admission  Homicidal Thoughts:  No  Memory:  Immediate;   Fair Recent;   Fair Remote;   Fair  Judgement:  Impaired  Insight:  Shallow  Psychomotor Activity:  Restlessness  Concentration:  Concentration: Poor and Attention Span: Fair  Recall:  [employer]Fair  Fund of Knowledge:  Fair  Language:  Fair  Akathisia:  No  Handed:  Right  AIMS (if indicated):     Assets:  Physical Health Social Support Transportation  ADL's:  Intact  Cognition:  WNL  Sleep:  Number of Hours: 5.5    I agree with current treatment plan on 09/28/2015, Patient seen face-to-face for psychiatric evaluation follow-up, chart reviewed. Reviewed the information documented and agree with the treatment plan.  09/26/15- Note by MD Mother Benjaman Kindlerlaine Schwalm at [id number] - was contacted by Clinical research associatewriter. Mother had initiated the IVC petition leading to her admission. According to her mother - Pt has been in and out of IP hospitals since the age of 25 y. Pt had atleast 20 hospitalizations till now. Pt 8 weeks prior to admission was observed as manic, grandiose and disruptive and [facility] [place] . Pt was started on seroquel and released after a few days. Pt got out and stopped taking her medications. Pt later on became more and more manic, but was able to get in  to [facility]. However , this Monday - [facility] felt patient was becoming more and more disorganized and disruptive and wanted her to be taken to an ED for evaluation. [facility] - very manic , after jumping over a moving car.Per mother - she had  also tried to get her in to a dualdiagnosis program in [place] , got her in to a plane , but she checked out of the facility the very next day. This was couple of weeks ago. According to mother - based on her behavior outside prior to  admission - pt is a potential danger to self or others and should not be released until she is more stable . Mother also wants her to get help with her substance abuse.  Treatment Plan Summary: Daily contact with patient to assess and evaluate symptoms and progress in treatment and Medication management  Discontinued Lamictal for lack of efficacy. Will continue Depakote DR 500 mg po q12 h for mood lability. Depakote level - 09/30/15 Will  continue Abilify to 15  mg po qhs for mood sx- with plan to offer Abilify Maintena 400 mg IM q28 days - first dose on 09/29/15. Will continue Gabapentin 300 mg po tid for anxiety sx/pain. Will continue Trazodone 100 mg po qhs prn for sleep. Will continue Advil 800 mg po q8h prn for pain. Will continue COWS/Clonidine protocol for opioid withdrawal sx. Will continue to monitor vitals ,medication compliance and treatment side effects while patient is here.  Will monitor for medical issues as well as call consult as needed.  Reviewed labs cbc - wnl, cmp - wnl, pregnancy test - negative, UDS- pos for thc, cocaine, opioid, BAL - 17 , EKG for qtc - wnl , tsh- wnl , lipid panel- wnl ,  hba1c- wnl , pl- 32.7  Slightly elevated - will continue to monitor . Provided substance abuse counseling - pt is motivated to return [facility]. CSW will continue working on disposition. [facility] or an IP treatment program based on her preference. Patient to participate in therapeutic milieu .   Oneta Rack, NP 09/28/2015, 5:14 PM

## 2015-09-28 NOTE — Progress Notes (Addendum)
Patient ID: Ok AnisLauren E Minnehan, female   DOB: 1990-06-26, 25 y.o.   MRN: [id number]  Pt currently presents with an anxious affect and behavior. Pt reports to writer that their goal is to "go home on Monday." Pt preoccupied with discharge. Pt reports good sleep with current medication regimen. Per reports of other patients, this patient has been requesting others visitors bring her medications including Suboxone. Pt overheard on the phone saying "Well, what do you expect, I am a drug addict."  Pt provided with medications per providers orders. Pt's labs and vitals were monitored throughout the night. Pt supported emotionally and encouraged to express concerns and questions. Pt educated on medications.  Pt's safety ensured with 15 minute and environmental checks. Pt currently denies SI/HI and A/V hallucinations. Pt verbally agrees to seek staff if SI/HI or A/VH occurs and to consult with staff before acting on any harmful thoughts. Will continue POC.   Alprazolam 1mg  pill found in patients room inside the toilet paper stack. Pt denies that this is her pill. Suggests that another patient planted it in her room. She asks "What will happen to me now, will I have to stay another day?" Pt requests to speak with charge nurse, if awake. Pt educated on rules and regulations of the unit and what contraband items means. Pt emotionally encouraged to focus on recovery and POC. Documentation completed, pill wasted in the sharps container and charge nurse notified of findings and pt request to speak with her about her suggestion.

## 2015-09-29 NOTE — Progress Notes (Signed)
Patient has been up and active on the unit, attended group this evening and has voiced no complaints.She reports looking forward to discharge on tomorrow. Patient currently denies having pain, -si/hi/a/v hall. Support and encouragement offered, safety maintained on unit, will continue to monitor.

## 2015-09-29 NOTE — Progress Notes (Signed)
[facility] MD Progress Note  09/29/2015 4:37 PM [name]  MRN:  [id number]   Subjective:  Patient reports " I am feeling better can you please take my visitation restriction off?" - patient report " I took my Abilify injection, I should be ready to discharge Monday right?"  Objective:[name] is awake, alert and oriented *4. Seen sitting in dayroom interacting well with staff and others.  Denies suicidal or homicidal ideation. Denies auditory or visual hallucination and does not appear to be responding to internal stimuli. Patient appears less hyper verbal today.  Patient reports she is excited that her boyfriend will come and see her today during visitation. (orders placed for visitation restriction)  Per staff notes xanax was found in patient bathroom.  Patient denies that medications was hers.     Patient reports  interacting well with staff and others. Patient reports she is medication compliant without mediation side effects. Patient reports she is ready to be discharged and doesn't want to be here any longer. Patient denies depression at this time. Reports good appetite and states she is resting well. Support, encouragement and reassurance was provided.    Principal Problem: Bipolar disorder, current episode manic w/o psychotic features, severe (HCC) Diagnosis:   Patient Active Problem List   Diagnosis Date Noted  . Bipolar disorder, current episode manic w/o psychotic features, severe (HCC) [F31.13] 09/25/2015  . Cocaine use disorder, mild, abuse [F14.10] 09/25/2015  . Cannabis use disorder, severe, dependence (HCC) [F12.20] 09/25/2015  . Tobacco use disorder [F17.200] 09/25/2015  . Opioid use disorder, moderate, dependence (HCC) [F11.20] 09/25/2015  . Attention deficit disorder [F90.9] 05/01/2014  . Chronic back pain [M54.9, G89.29] 03/05/2013  . BMI 27.0-27.9,adult [Z68.27] 03/05/2013   Total Time spent with patient: 25 minutes  Past Psychiatric History: Please see  H&P.   Past Medical History:  Past Medical History:  Diagnosis Date  . Anxiety   . Atrial septal defect    corrected 1999  . Bipolar disorder (HCC)   . Depression   . Heart murmur   . Mental health problem   . Self-mutilation   . Substance abuse     Past Surgical History:  Procedure Laterality Date  . ASD REPAIR     Family History:  Family History  Problem Relation Age of Onset  . Cancer Mother     breast - both  . Arthritis Maternal Grandmother     rheumatoid  . Cancer Maternal Grandfather     brain  . Alzheimer's disease Paternal Grandmother   . Mental illness Neg Hx   . Drug abuse Neg Hx   . Suicidality Neg Hx    Family Psychiatric  History: Please see H&P.  Social History: Please see H&P.  History  Alcohol Use  . Yes    Comment: a few a week     History  Drug use: Unknown  . Frequency: 3.0 times per week  . Types: Marijuana, Cocaine    Comment: States no per pt    Social History   Social History  . Marital status: Single    Spouse name: N/A  . Number of children: N/A  . Years of education: N/A   Social History Main Topics  . Smoking status: Current Every Day Smoker    Packs/day: 2.00    Years: 7.00    Types: Cigarettes  . Smokeless tobacco: Never Used  . Alcohol use Yes     Comment: a few a week  . Drug use:  Unknown    Frequency: 3.0 times per week    Types: Marijuana, Cocaine     Comment: States no per pt  . Sexual activity: Yes    Birth control/ protection: None     Comment: number of sex partners in the last 12 months 1   Other Topics Concern  . None   Social History Narrative  . None   Additional Social History:                         Sleep: Fair  Appetite:  Fair  Current Medications: Current Facility-Administered Medications  Medication Dose Route Frequency Provider Last Rate Last Dose  . acetaminophen (TYLENOL) tablet 650 mg  650 mg Oral Q4H PRN Charm RingsJamison Y Lord, NP   650 mg at 09/26/15 0834  . alum & mag  hydroxide-simeth (MAALOX/MYLANTA) 200-200-20 MG/5ML suspension 30 mL  30 mL Oral PRN Charm RingsJamison Y Lord, NP      . ARIPiprazole (ABILIFY) tablet 15 mg  15 mg Oral QHS Jomarie LongsSaramma Eappen, MD   15 mg at 09/28/15 2136  . ARIPiprazole ER SUSR 400 mg  400 mg Intramuscular Q28 days Jomarie LongsSaramma Eappen, MD   400 mg at 09/29/15 1100  . divalproex (DEPAKOTE) DR tablet 500 mg  500 mg Oral Q12H Jomarie LongsSaramma Eappen, MD   500 mg at 09/29/15 [id number]  . gabapentin (NEURONTIN) capsule 300 mg  300 mg Oral TID Charm RingsJamison Y Lord, NP   300 mg at 09/29/15 1624  . ibuprofen (ADVIL,MOTRIN) tablet 800 mg  800 mg Oral Q6H PRN Jomarie LongsSaramma Eappen, MD   800 mg at 09/29/15 1622  . magnesium hydroxide (MILK OF MAGNESIA) suspension 30 mL  30 mL Oral Daily PRN Charm RingsJamison Y Lord, NP      . nicotine polacrilex (NICORETTE) gum 2 mg  2 mg Oral PRN Jomarie LongsSaramma Eappen, MD   2 mg at 09/29/15 1607  . risperiDONE (RISPERDAL M-TABS) disintegrating tablet 2 mg  2 mg Oral Q8H PRN Adonis BrookSheila Agustin, NP   2 mg at 09/29/15 [id number]   And  . ziprasidone (GEODON) injection 20 mg  20 mg Intramuscular PRN Adonis BrookSheila Agustin, NP      . traZODone (DESYREL) tablet 100 mg  100 mg Oral QHS PRN Jomarie LongsSaramma Eappen, MD   100 mg at 09/28/15 0024    Lab Results:  No results found for this or any previous visit (from the past 48 hour(s)).  Blood Alcohol level:  Lab Results  Component Value Date   ETH 17 (H) 09/24/2015   ETH <5 07/19/2015    Metabolic Disorder Labs: Lab Results  Component Value Date   HGBA1C 5.2 09/26/2015   MPG 103 09/26/2015   Lab Results  Component Value Date   PROLACTIN 32.7 (H) 09/26/2015   Lab Results  Component Value Date   CHOL 139 09/26/2015   TRIG 88 09/26/2015   HDL 56 09/26/2015   CHOLHDL 2.5 09/26/2015   VLDL 18 09/26/2015   LDLCALC 65 09/26/2015    Physical Findings: AIMS: Facial and Oral Movements Muscles of Facial Expression: None, normal Lips and Perioral Area: None, normal Jaw: None, normal Tongue: None, normal,Extremity Movements Upper (arms,  wrists, hands, fingers): None, normal Lower (legs, knees, ankles, toes): None, normal, Trunk Movements Neck, shoulders, hips: None, normal, Overall Severity Severity of abnormal movements (highest score from questions above): None, normal Incapacitation due to abnormal movements: None, normal Patient's awareness of abnormal movements (rate only patient's report): No Awareness, Dental Status Current  problems with teeth and/or dentures?: No Does patient usually wear dentures?: No  CIWA:  CIWA-Ar Total: 1 COWS:  COWS Total Score: 1  Musculoskeletal: Strength & Muscle Tone: within normal limits Gait & Station: normal Patient leans: N/A  Psychiatric Specialty Exam: Physical Exam  Nursing note and vitals reviewed. Constitutional: She is oriented to person, place, and time. She appears well-developed.  HENT:  Head: Normocephalic.  Musculoskeletal: Normal range of motion.  Neurological: She is alert and oriented to person, place, and time.  Psychiatric: She has a normal mood and affect. Her behavior is normal.    Review of Systems  Psychiatric/Behavioral: Positive for depression (stable). The patient is nervous/anxious.   All other systems reviewed and are negative.   Blood pressure 114/81, pulse (!) 104, temperature 98.6 F (37 C), temperature source Oral, resp. rate 18, height 5' 10.5" (1.791 m), weight 71.4 kg (157 lb 8 oz), last menstrual period 09/19/2015, SpO2 100 %.Body mass index is 22.28 kg/m.  General Appearance: Casual  Eye Contact:  Good  Speech:  Pressured  Volume:  Increased  Mood:  Euphoric  Affect:  Labile and irritable  Thought Process:  Irrelevant and Descriptions of Associations: Tangential  Orientation:  Full (Time, Place, and Person)  Thought Content:  Rumination and Tangential  Suicidal Thoughts:  denies - but is manic, euphoric , impulsive and intrusive - potential danger to self or others - attempted to jump over a moving car prior to admission  Homicidal  Thoughts:  No  Memory:  Immediate;   Fair Recent;   Fair Remote;   Fair  Judgement:  Impaired  Insight:  Shallow  Psychomotor Activity:  Restlessness  Concentration:  Concentration: Poor and Attention Span: Fair  Recall:  [employer] of Knowledge:  Fair  Language:  Fair  Akathisia:  No  Handed:  Right  AIMS (if indicated):     Assets:  Physical Health Social Support Transportation  ADL's:  Intact  Cognition:  WNL  Sleep:  Number of Hours: 6.75     I agree with current treatment plan on 09/29/2015, Patient seen face-to-face for psychiatric evaluation follow-up, chart reviewed. Reviewed the information documented and agree with the treatment plan.  09/26/15- Note by MD Mother Charlie Char at [phone] - was contacted by Clinical research associate. Mother had initiated the IVC petition leading to her admission. According to her mother - Pt has been in and out of IP hospitals since the age of 35 y. Pt had atleast 20 hospitalizations till now. Pt 8 weeks prior to admission was observed as manic, grandiose and disruptive and hence [facility] [place] . Pt was started on seroquel and released after a few days. Pt got out and stopped taking her medications. Pt later on became more and more manic, but was able to get in  to a substance abuse program at [facility]. However , this Monday - [facility] felt patient was becoming more and more disorganized and disruptive and wanted her to be taken to an ED for evaluation. Pt later on ended up at [facility] - very manic , after jumping over a moving car.Per mother - she had also tried to get her in to a dualdiagnosis program in [place] , got her in to a plane , but she checked out of the facility the very next day. This was couple of weeks ago. According to mother - based on her behavior outside prior to admission -  pt is a potential danger to self or others and should not be released until she is more stable . Mother  also wants her to get help with her substance abuse.  Treatment Plan Summary: Daily contact with patient to assess and evaluate symptoms and progress in treatment and Medication management  Discontinued Lamictal for lack of efficacy. Will continue Depakote DR 500 mg po q12 h for mood lability. Depakote level - 09/30/15 Will  continue Abilify to 15  mg po qhs for mood sx- with plan to offer Abilify Maintena 400 mg IM q28 days - first dose on 09/29/15. Will continue Gabapentin 300 mg po tid for anxiety sx/pain. Will continue Trazodone 100 mg po qhs prn for sleep. Will continue Advil 800 mg po q8h prn for pain. Will continue COWS/Clonidine protocol for opioid withdrawal sx. Will continue to monitor vitals ,medication compliance and treatment side effects while patient is here.  Will monitor for medical issues as well as call consult as needed.  Reviewed labs cbc - wnl, cmp - wnl, pregnancy test - negative, UDS- pos for thc, cocaine, opioid, BAL - 17 , EKG for qtc - wnl , tsh- wnl , lipid panel- wnl ,  hba1c- wnl , pl- 32.7  Slightly elevated - will continue to monitor . Provided substance abuse counseling - pt is motivated to return [facility]. CSW will continue working on disposition. Pt to be referred [facility] to [facility] or an IP treatment program based on her preference. Patient to participate in therapeutic milieu .   Oneta Rack, NP 09/29/2015, 4:37 PM

## 2015-09-29 NOTE — Progress Notes (Signed)
Adult Psychoeducational Group Note  Date:  09/29/2015 Time:  10:12 PM  Group Topic/Focus:  Wrap-Up Group:   The focus of this group is to help patients review their daily goal of treatment and discuss progress on daily workbooks.   Participation Level:  Active  Participation Quality:  Appropriate  Affect:  Appropriate  Cognitive:  Appropriate  Insight: Good  Engagement in Group:  Engaged  Modes of Intervention:  Activity  Additional Comments:  Patient rated her day a 7. Stated her goal is to remain positive and get ready for discharge tomorrow.  [name] 09/29/2015, 10:12 PM

## 2015-09-29 NOTE — Progress Notes (Signed)
D:  Patient's self inventory sheet, patient sleeps good, no sleep medication given.  Good appetite, normal energy level, good concentration.  Denied depression and hopeless.  Rated anxiety 4.  Denied withdrawals.  Diarrhea.  Denied  physical pain.  Goal is to have a good discharge.  Plans to work on discharge plans.  Does have discharge plans. A:  Medications administered per MD orders.  Emotional support and encouragement given patient. R:  Denied SI and HI.  Denied A/V hallucinations.  Safety maintained with 15 minute checks.

## 2015-09-29 NOTE — Plan of Care (Signed)
Problem: Education: Goal: Understanding of discharge needs will improve Outcome: Not Progressing Pt denies having any current recovery plans post discharge

## 2015-09-29 NOTE — [facility] Group Notes (Signed)
[facility] LCSW Group Therapy  09/29/2015 11:30 AM until Noon  Type of Therapy:  Group Therapy  Participation Level:  Active  Participation Quality:  Intrusive, Monopolizing, Redirectable and Sharing  Affect:  Excited and Labile  Cognitive:  Alert and Oriented  Insight:  Limited  Engagement in Therapy:  Improving  Modes of Intervention:  Activity, Exploration, Rapport Building, Socialization and Support  Summary of Progress/Problems: Focus of group was to have patient discuss what support means to them and participate in activity.  As patients processed their anxiety about discharge and described healthy supports patient was more focused on minutia verses general concepts.  Patient chose a visual to represent decompensation as a lizard biting a hand and described well how she feels her disorder nips at her constantly. For improvement patient chose an image of the mountains and nature and shared how she truly needs to spend time outside each day.   Carney Bernatherine C Kween Bacorn, LCSW

## 2015-09-29 NOTE — [facility] Group Notes (Signed)

## 2015-09-29 NOTE — Plan of Care (Signed)
Problem: Education: Goal: Will be free of psychotic symptoms Outcome: Progressing Nurse discussed depression/coping skills with patient.    

## 2015-09-30 LAB — VALPROIC ACID LEVEL: VALPROIC ACID LVL: 66 ug/mL (ref 50.0–100.0)

## 2015-09-30 MED ORDER — DIVALPROEX SODIUM 500 MG PO DR TAB: 500.0000 mg | DELAYED_RELEASE_TABLET | Freq: Two times a day (BID) | ORAL | 0 refills | Status: DC

## 2015-09-30 MED ORDER — ARIPIPRAZOLE 15 MG PO TABS: 15.0000 mg | ORAL_TABLET | Freq: Every day | ORAL | 0 refills | Status: DC

## 2015-09-30 MED ORDER — NICOTINE POLACRILEX 2 MG MT GUM: 2.0000 mg | CHEWING_GUM | OROMUCOSAL | 0 refills | Status: DC | PRN

## 2015-09-30 MED ORDER — GABAPENTIN 300 MG PO CAPS: 300.0000 mg | ORAL_CAPSULE | Freq: Three times a day (TID) | ORAL | 0 refills | Status: DC

## 2015-09-30 MED ORDER — ARIPIPRAZOLE ER 400 MG IM SUSR: 400.0000 mg | INTRAMUSCULAR | 0 refills | Status: DC

## 2015-09-30 MED ORDER — TRAZODONE HCL 100 MG PO TABS: 100.0000 mg | ORAL_TABLET | Freq: Every evening | ORAL | 0 refills | Status: DC | PRN

## 2015-09-30 NOTE — Progress Notes (Signed)
D:  Patient denied SI and HI, contracts for safety.  Denied A/V hallucinations.   A:  Medications administered per MD orders.  Emotional support and encouragement given patient. R:  Safety maintained with 15 minute checks.  
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
## 2015-09-30 NOTE — [facility] Suicide Risk Assessment (Signed)
  [facility]   Principal Problem: Bipolar disorder, current episode manic w/o psychotic features, severe [facility](HCC) Discharge Diagnoses:  Patient Active Problem List   Diagnosis Date Noted  . Bipolar disorder, current episode manic w/o psychotic features, severe (HCC) [F31.13] 09/25/2015  . Cocaine use disorder, mild, abuse [F14.10] 09/25/2015  . Cannabis use disorder, severe, dependence (HCC) [F12.20] 09/25/2015  . Tobacco use disorder [F17.200] 09/25/2015  . Opioid use disorder, moderate, dependence (HCC) [F11.20] 09/25/2015  . Attention deficit disorder [F90.9] 05/01/2014  . Chronic back pain [M54.9, G89.29] 03/05/2013  . BMI 27.0-27.9,adult [Z68.27] 03/05/2013    Total Time spent with patient: 30 minutes  Musculoskeletal: Strength & Muscle Tone: within normal limits Gait & Station: normal Patient leans: N/A  Psychiatric Specialty Exam: Review of Systems  Psychiatric/Behavioral: Positive for substance abuse. Negative for depression.  All other systems reviewed and are negative.   Blood pressure 126/66, pulse (!) 116, temperature 98 F (36.7 C), temperature source Oral, resp. rate 18, height 5' 10.5" (1.791 m), weight 71.4 kg (157 lb 8 oz), last menstrual period 09/19/2015, SpO2 100 %.Body mass index is 22.28 kg/m.  General Appearance: Casual  Eye Contact::  Fair  Speech:  Clear and Coherent409  Volume:  Normal  Mood:  Euthymic  Affect:  Appropriate  Thought Process:  Goal Directed and Descriptions of Associations: Intact  Orientation:  Full (Time, Place, and Person)  Thought Content:  Logical  Suicidal Thoughts:  No  Homicidal Thoughts:  No  Memory:  Immediate;   Fair Recent;   Fair Remote;   Fair  Judgement:  Fair  Insight:  Fair  Psychomotor Activity:  Normal  Concentration:  Fair  Recall:  FiservFair  Fund of Knowledge:Fair  Language: Fair  Akathisia:  No  Handed:  Right  AIMS (if indicated):   0  Assets:  Desire for Improvement  Sleep:   Number of Hours: 6.75  Cognition: WNL  ADL's:  Intact   Mental Status Per Nursing Assessment::   On Admission:  NA  Demographic Factors:  Caucasian  Loss Factors: NA  Historical Factors: Impulsivity  Risk Reduction Factors:   Positive social support and Positive therapeutic relationship  Continued Clinical Symptoms:  Alcohol/Substance Abuse/Dependencies Previous Psychiatric Diagnoses and Treatments  Cognitive Features That Contribute To Risk:  None    Suicide Risk:  Minimal: No identifiable suicidal ideation.  Patients presenting with no risk factors but with morbid ruminations; may be classified as minimal risk based on the severity of the depressive symptoms  Follow-up Information    [facility]. Go in 1 week(s).   Why:  Patient has been seen at this practice and can return at discharge.  Per practice, they have a walk in clinic daily, Mon - Fri, 8:30 - 5 and patients will be seen same day.   Contact information: [address] [place] [phone]          Plan Of Care/Follow-up recommendations:  Activity:  NO RESTRICTIONS Diet:  REGULAR Tests:  AS NEEDED Other:  ABILIFY MAINTENA 400 MG IM - LAST DOSE 09/29/15- REPEAT [id number]  Annalyn Blecher, MD 09/30/2015, 9:41 AM

## 2015-09-30 NOTE — Progress Notes (Signed)
Discharge Note:  Patient discharged home with mother.  Patient denied SI and HI.  Denied A/V hallucinations.  Denied pain.  Suicide prevention information given and discussed with patient who stated she understood and had no questions.  Patient stated she received all her belongings and her prescriptions.  All required discharge information given to patient at discharge.  Patient stated she [facility].

## 2015-09-30 NOTE — Plan of Care (Signed)
Problem: [facility] Participation in Recreation Therapeutic Interventions Goal: STG-Patient will attend/participate in Rec Therapy Group Ses STG-The Patient will attend and participate in Recreation Therapy Group Sessions  Outcome: Completed/Met Date Met: 09/30/15 Pt actively participated and attended wellness, stress management and coping skills recreation therapy sessions.  Victorino Sparrow, LRT/CTRS

## 2015-09-30 NOTE — Progress Notes (Signed)
Recreation Therapy Notes  Date: 09/30/15 Time: 1000 Location: [address] Dayroom  Group Topic: Wellness  Goal Area(s) Addresses:  Patient will define components of whole wellness. Patient will verbalize benefit of whole wellness.  Behavioral Response: Engaged  Intervention:  [employer]ach ball, chairs  Activity: [employer] it [employer] Volleyball.  LRT arranged the chairs in a circle.  Patients were to sit around the circle and pass the ball back and forth as the LRT kept count of the number of times the ball was hit.  The ball could bounce off of the floor but it could not roll to a stop.  If the ball came to a stop, the count would start over.  Education:Wellness, Discharge Planning.   Education Outcome: Acknowledges education/In group clarification offered/Needs additional education.   Clinical Observations/Feedback: Pt arrived late to group.  Pt was very active and social during group.  Pt would also give advise on how to hit the ball.  Pt became preoccupied by when she would be going home.  Pt did express that she liked the activity.     Caroll RancherMarjette Allessandra Bernardi, LRT/CTRS     Caroll RancherLindsay, Sylvan Lahm A 09/30/2015 11:39 AM

## 2015-09-30 NOTE — Progress Notes (Signed)
  [facility] :  Will you be returning to the same living situation after discharge:  No. At discharge, do you have transportation home?: Yes,  SO Do you have the ability to pay for your medications: Yes,  insurance  Release of information consent forms completed and in the chart;  Patient's signature needed at discharge.  Patient to Follow up at: Follow-up Information    [facility]. Go in 1 week(s).   Why:  Patient has been seen at this practice and can return at discharge.  Per practice, they have a walk in clinic daily, Mon - Fri, 8:30 - 5 and patients will be seen same day.   Contact information: [address] [place] [phone]       [facility] Follow up on 10/01/2015.   Why:  [facility] 3[facility] Contact information: [address] [place]          Next level of care provider has [facility] Link:no  Safety Planning and Suicide Prevention discussed: Yes,  yes  Have you used any form of tobacco in the last 30 days? (Cigarettes, Smokeless Tobacco, Cigars, and/or Pipes): Yes  Has patient been referred to the Quitline?: Patient refused referral  Patient has been referred for addiction treatment: Pt. refused referral  Ida RogueRodney B Lamarco Gudiel 09/30/2015, 10:15 AM

## 2015-09-30 NOTE — Tx Team (Signed)
Interdisciplinary Treatment and Diagnostic Plan Update  09/30/2015 Time of Session: 10:13 AM  [name] MRN: [id number]  Principal Diagnosis: Bipolar disorder, current episode manic w/o psychotic features, severe ([place])  Secondary Diagnoses: Principal Problem:   Bipolar disorder, current episode manic w/o psychotic features, severe (H[facility]) Active Problems:   Cocaine use disorder, mild, abuse   Cannabis use disorder, severe, dependence ([place])   Tobacco use disorder   Opioid use disorder, moderate, dependence ([place])   Current Medications:  Current Facility-Administered Medications  Medication Dose Route Frequency Provider Last Rate Last Dose  . acetaminophen (TYLENOL) tablet 650 mg  650 mg Oral Q4H PRN Patrecia Pour, NP   650 mg at 09/26/15 0834  . alum & mag hydroxide-simeth (MAALOX/MYLANTA) 200-200-20 MG/5ML suspension 30 mL  30 mL Oral PRN Patrecia Pour, NP      . ARIPiprazole (ABILIFY) tablet 15 mg  15 mg Oral QHS Ursula Alert, MD   15 mg at 09/29/15 2141  . ARIPiprazole ER SUSR 400 mg  400 mg Intramuscular Q28 days Ursula Alert, MD   400 mg at 09/29/15 1100  . divalproex (DEPAKOTE) DR tablet 500 mg  500 mg Oral Q12H Ursula Alert, MD   500 mg at 09/30/15 0748  . gabapentin (NEURONTIN) capsule 300 mg  300 mg Oral TID Patrecia Pour, NP   300 mg at 09/30/15 0748  . ibuprofen (ADVIL,MOTRIN) tablet 800 mg  800 mg Oral Q6H PRN Ursula Alert, MD   800 mg at 09/29/15 1622  . magnesium hydroxide (MILK OF MAGNESIA) suspension 30 mL  30 mL Oral Daily PRN Patrecia Pour, NP      . nicotine polacrilex (NICORETTE) gum 2 mg  2 mg Oral PRN Ursula Alert, MD   2 mg at 09/30/15 0749  . risperiDONE (RISPERDAL M-TABS) disintegrating tablet 2 mg  2 mg Oral Q8H PRN Kerrie Buffalo, NP   2 mg at 09/30/15 1009   And  . ziprasidone (GEODON) injection 20 mg  20 mg Intramuscular PRN Kerrie Buffalo, NP      . traZODone (DESYREL) tablet 100 mg  100 mg Oral QHS PRN Ursula Alert, MD   100 mg at  09/29/15 2138    PTA Medications: Prescriptions Prior to Admission  Medication Sig Dispense Refill Last Dose  . ziprasidone (GEODON) 20 MG capsule Take 20 mg by mouth 2 (two) times daily with a meal.   09/23/2015 at Unknown time  . mirtazapine (REMERON) 30 MG tablet TAKE 1 TABLET(30 MG) BY MOUTH AT BEDTIME (Patient not taking: Reported on 09/24/2015) 30 tablet 0 Not Taking at Unknown time  . traZODone (DESYREL) 100 MG tablet Take 1 tablet (100 mg total) by mouth at bedtime. (Patient not taking: Reported on 09/24/2015) 30 tablet 0 Not Taking at Unknown time    Treatment Modalities: Medication Management, Group therapy, Case management,  1 to 1 session with clinician, Psychoeducation, Recreational therapy.   Physician Treatment Plan for Primary Diagnosis: Bipolar disorder, current episode manic w/o psychotic features, severe ([place]) Long Term Goal(s): Improvement in symptoms so as ready for discharge  Short Term Goals: Ability to demonstrate self-control will improve  Medication Management: Evaluate patient's response, side effects, and tolerance of medication regimen.  Therapeutic Interventions: 1 to 1 sessions, Unit Group sessions and Medication administration.  Evaluation of Outcomes: Met  Physician Treatment Plan for Secondary Diagnosis: Principal Problem:   Bipolar disorder, current episode manic w/o psychotic features, severe (HCC) Active Problems:   Cocaine use disorder, mild, abuse  Cannabis use disorder, severe, dependence (HCC)   Tobacco use disorder   Opioid use disorder, moderate, dependence ([place])   Long Term Goal(s): Improvement in symptoms so as ready for discharge  Short Term Goals: Ability to identify triggers associated with substance abuse/mental health issues will improve  Medication Management: Evaluate patient's response, side effects, and tolerance of medication regimen.  Therapeutic Interventions: 1 to 1 sessions, Unit Group sessions and Medication  administration.  Evaluation of Outcomes: Not Met, Pt in denial about bothy substance abuse and mental health   RN Treatment Plan for Primary Diagnosis: Bipolar disorder, current episode manic w/o psychotic features, severe ([place]) Long Term Goal(s): Knowledge of disease and therapeutic regimen to maintain health will improve  Short Term Goals: Ability to demonstrate self-control and Compliance with prescribed medications will improve  Medication Management: RN will administer medications as ordered by provider, will assess and evaluate patient's response and provide education to patient for prescribed medication. RN will report any adverse and/or side effects to prescribing provider.  Therapeutic Interventions: 1 on 1 counseling sessions, Psychoeducation, Medication administration, Evaluate responses to treatment, Monitor vital signs and CBGs as ordered, Perform/monitor CIWA, COWS, AIMS and Fall Risk screenings as ordered, Perform wound care treatments as ordered.  Evaluation of Outcomes: Yes   LCSW Treatment Plan for Primary Diagnosis: Bipolar disorder, current episode manic w/o psychotic features, severe ([place]) Long Term Goal(s): Safe transition to appropriate next level of care at discharge, Engage patient in therapeutic group addressing interpersonal concerns.  Short Term Goals: Engage patient in aftercare planning with referrals and resources, Increase emotional regulation, Facilitate patient progression through stages of change regarding substance use diagnoses and concerns and Identify triggers associated with mental health/substance abuse issues  Therapeutic Interventions: Assess for all discharge needs, 1 to 1 time with Social worker, Explore available resources and support systems, Assess for adequacy in community support network, Educate family and significant other(s) on suicide prevention, Complete Psychosocial Assessment, Interpersonal group therapy.  Evaluation of Outcomes: Not  Met  Pt in denial about substance abuse, mental health issues   Progress in Treatment: Attending groups: Yes Participating in groups: Yes Taking medication as prescribed: Yes Toleration medication: Yes, no side effects reported at this time Family/Significant other contact made:  Patient understands diagnosis:No  Limited insight Discussing patient identified problems/goals with staff: Yes Medical problems stabilized or resolved: Yes Denies suicidal/homicidal ideation: Yes Issues/concerns per patient self-inventory: None Other: N/A  New problem(s) identified: None identified at this time.   New Short Term/Long Term Goal(s): None identified at this time.   Discharge Plan or Barriers: states she will return home, follow up with [facility]  Has been offered referral to rehab, refusing  Reason for Continuation of Hospitalization:   Estimated Length of Stay: D/C today  Attendees: Patient: 09/30/2015  10:13 AM  Physician: Ursula Alert, MD 09/30/2015  10:13 AM  Nursing: Hoy Register, RN 09/30/2015  10:13 AM  RN Care Manager: Lars Pinks, RN 09/30/2015  10:13 AM  Social Worker: Ripley Fraise 09/30/2015  10:13 AM  Recreational Therapist: Laretta Bolster  09/30/2015  10:13 AM  Other: Norberto Sorenson 09/30/2015  10:13 AM  Other:  09/30/2015  10:13 AM    Scribe for Treatment Team:  Roque Lias 09/30/2015 10:13 AM

## 2015-10-07 ENCOUNTER — Ambulatory Visit (INDEPENDENT_AMBULATORY_CARE_PROVIDER_SITE_OTHER): Payer: Commercial Managed Care - PPO

## 2015-10-08 ENCOUNTER — Ambulatory Visit: Payer: Commercial Managed Care - PPO

## 2015-10-14 ENCOUNTER — Emergency Department (HOSPITAL_COMMUNITY)
Admission: EM | Admit: 2015-10-14 | Discharge: 2015-10-14 | Disposition: A | Discharge status: Home / Self Care | Payer: Commercial Managed Care - PPO | Attending: Emergency Medicine | Admitting: Emergency Medicine

## 2015-10-14 ENCOUNTER — Emergency Department (HOSPITAL_COMMUNITY): Payer: Commercial Managed Care - PPO

## 2015-10-14 ENCOUNTER — Encounter (HOSPITAL_COMMUNITY): Payer: Self-pay | Admitting: Emergency Medicine

## 2015-10-14 DIAGNOSIS — Y929 Unspecified place or not applicable: Secondary | ICD-10-CM | POA: Insufficient documentation

## 2015-10-14 DIAGNOSIS — F17210 Nicotine dependence, cigarettes, uncomplicated: Secondary | ICD-10-CM | POA: Diagnosis not present

## 2015-10-14 DIAGNOSIS — S8261XA Displaced fracture of lateral malleolus of right fibula, initial encounter for closed fracture: Secondary | ICD-10-CM | POA: Diagnosis not present

## 2015-10-14 DIAGNOSIS — Z791 Long term (current) use of non-steroidal anti-inflammatories (NSAID): Secondary | ICD-10-CM | POA: Diagnosis not present

## 2015-10-14 DIAGNOSIS — Y999 Unspecified external cause status: Secondary | ICD-10-CM | POA: Insufficient documentation

## 2015-10-14 DIAGNOSIS — S93401A Sprain of unspecified ligament of right ankle, initial encounter: Secondary | ICD-10-CM

## 2015-10-14 DIAGNOSIS — X501XXA Overexertion from prolonged static or awkward postures, initial encounter: Secondary | ICD-10-CM | POA: Insufficient documentation

## 2015-10-14 DIAGNOSIS — S99911A Unspecified injury of right ankle, initial encounter: Secondary | ICD-10-CM | POA: Diagnosis present

## 2015-10-14 DIAGNOSIS — Y9301 Activity, walking, marching and hiking: Secondary | ICD-10-CM | POA: Insufficient documentation

## 2015-10-14 DIAGNOSIS — Z79899 Other long term (current) drug therapy: Secondary | ICD-10-CM | POA: Insufficient documentation

## 2015-10-14 DIAGNOSIS — F909 Attention-deficit hyperactivity disorder, unspecified type: Secondary | ICD-10-CM | POA: Insufficient documentation

## 2015-10-14 DIAGNOSIS — T148XXA Other injury of unspecified body region, initial encounter: Secondary | ICD-10-CM

## 2015-10-14 NOTE — ED Provider Notes (Signed)
[facility]-EMERGENCY DEPT Provider Note   CSN: [id number] Arrival date & time: 10/14/15  1441   By signing my name below, I, Avnee Patel, attest that this documentation has been prepared under the direction and in the presence of  Newell RubbermaidJeffrey Eleni Frank, PA-C. Electronically Signed: Clovis PuAvnee Patel, ED Scribe. 10/14/15. 5:00 PM.  History   Chief Complaint Chief Complaint  Patient presents with  . Ankle Pain    The history is provided by the patient. No language interpreter was used.   HPI Comments:  [name] is a 25 y.o. female who presents to the Emergency Department complaining of acute onset, moderate right ankle pain s/p an incident which occurred today. Pt notes she was walking and twisted her ankle. Pt notes pain is exacerbated to the touch. Pt notes she has fractured her ankle in middle school and high school. No alleviating factors noted. Pt denies any other complaints at this time.   Past Medical History:  Diagnosis Date  . Anxiety   . Atrial septal defect    corrected 1999  . Bipolar disorder (HCC)   . Depression   . Heart murmur   . Mental health problem   . Self-mutilation   . Substance abuse     Patient Active Problem List   Diagnosis Date Noted  . Bipolar disorder, current episode manic w/o psychotic features, severe (H[facility]) 09/25/2015  . Cocaine use disorder, mild, abuse 09/25/2015  . Cannabis use disorder, severe, dependence (HCC) 09/25/2015  . Tobacco use disorder 09/25/2015  . Opioid use disorder, moderate, dependence (HCC) 09/25/2015  . Attention deficit disorder 05/01/2014  . Chronic back pain 03/05/2013  . BMI 27.0-27.9,adult 03/05/2013    Past Surgical History:  Procedure Laterality Date  . ASD REPAIR      OB History    No data available       Home Medications    Prior to Admission medications   Medication Sig Start Date End Date Taking? Authorizing Provider  ARIPiprazole (ABILIFY) 15 MG tablet Take 1 tablet (15 mg total) by mouth at bedtime.  09/30/15   Oneta Rackanika N Lewis, NP  ARIPiprazole ER 400 MG SUSR Inject 400 mg into the muscle every 28 (twenty-eight) days. To be give by PCP (Primary Care Provider) 10/26/15   Oneta Rackanika N Lewis, NP  divalproex (DEPAKOTE) 500 MG DR tablet Take 1 tablet (500 mg total) by mouth every 12 (twelve) hours. 09/30/15   Oneta Rackanika N Lewis, NP  gabapentin (NEURONTIN) 300 MG capsule Take 1 capsule (300 mg total) by mouth 3 (three) times daily. 09/30/15   Oneta Rackanika N Lewis, NP  ibuprofen (ADVIL,MOTRIN) 800 MG tablet Take 1 tablet (800 mg total) by mouth every 8 (eight) hours as needed. 09/26/15   Charlestine Nighthristopher Lawyer, PA-C  nicotine polacrilex (NICORETTE) 2 MG gum Take 1 each (2 mg total) by mouth as needed for smoking cessation. 09/30/15   Oneta Rackanika N Lewis, NP  traZODone (DESYREL) 100 MG tablet Take 1 tablet (100 mg total) by mouth at bedtime as needed for sleep (agitation). 09/30/15   Oneta Rackanika N Lewis, NP    Family History Family History  Problem Relation Age of Onset  . Cancer Mother     breast - both  . Arthritis Maternal Grandmother     rheumatoid  . Cancer Maternal Grandfather     brain  . Alzheimer's disease Paternal Grandmother   . Mental illness Neg Hx   . Drug abuse Neg Hx   . Suicidality Neg Hx     Social  History Social History  Substance Use Topics  . Smoking status: Current Every Day Smoker    Packs/day: 2.00    Years: 7.00    Types: Cigarettes  . Smokeless tobacco: Never Used  . Alcohol use Yes     Comment: a few a week     Allergies   Review of patient's allergies indicates no known allergies.   Review of Systems Review of Systems 10 systems reviewed and all are negative for acute change except as noted in the HPI.   Physical Exam Updated Vital Signs BP 131/76 (BP Location: Right Arm)   Pulse 102   Temp 98.3 F (36.8 C) (Oral)   LMP 10/10/2015   SpO2 100%   Physical Exam  Constitutional: She is oriented to person, place, and time. She appears well-developed and well-nourished.  HENT:    Head: Normocephalic and atraumatic.  Eyes: Conjunctivae are normal. Pupils are equal, round, and reactive to light. Right eye exhibits no discharge. Left eye exhibits no discharge. No scleral icterus.  Neck: Normal range of motion. No JVD present. No tracheal deviation present.  Pulmonary/Chest: Effort normal. No stridor.  Musculoskeletal:  Obvious swelling to the right lateral ankle, tenderness to palpation of the ankle and foot diffusely. Pupils 2+, sensation intact  Neurological: She is alert and oriented to person, place, and time. Coordination normal.  Nursing note and vitals reviewed.    ED Treatments / Results  DIAGNOSTIC STUDIES:  Oxygen Saturation is 100% on RA, normal by my interpretation.    COORDINATION OF CARE:  4:57 PM Discussed treatment plan with pt at bedside and pt agreed to plan.  Labs (all labs ordered are listed, but only abnormal results are displayed) Labs Reviewed - No data to display  EKG  EKG Interpretation None       Radiology Dg Ankle Complete Right  Result Date: 10/14/2015 CLINICAL DATA:  25 year old female status post twisting injury with pain. Initial encounter. EXAM: RIGHT ANKLE - COMPLETE 3+ VIEW COMPARISON:  Right foot series from today reported separately. FINDINGS: Calcaneus intact. Bone mineralization is within normal limits. Mortise joint alignment preserved. Talar dome intact. Possible small joint effusion. The distal tibia appears intact. There are tiny ossific fragments distal to the lateral malleolus (arrow). The distal fibula otherwise appears intact. Visualized tarsal bones intact. IMPRESSION: Appearance suspicious for tiny avulsion fracture at the lateral malleolus with small joint effusion. Electronically Signed   By: Odessa Fleming M.D.   On: 10/14/2015 16:07   Dg Foot Complete Right  Result Date: 10/14/2015 CLINICAL DATA:  25 year old female status post twisting injury with pain. Initial encounter. EXAM: RIGHT FOOT COMPLETE - 3+ VIEW  COMPARISON:  None. FINDINGS: Calcaneus intact. Bone mineralization is within normal limits. Midfoot appears in tact. Metatarsals and phalanges intact. Joint spaces and alignment within normal limits. IMPRESSION: No acute fracture or dislocation identified about the right foot. Electronically Signed   By: Odessa Fleming M.D.   On: 10/14/2015 16:05    Procedures Procedures (including critical care time)  Medications Ordered in ED Medications - No data to display   Initial Impression / Assessment and Plan / ED Course  I have reviewed the triage vital signs and the nursing notes.  Pertinent labs & imaging results that were available during my care of the patient were reviewed by me and considered in my medical decision making (see chart for details).  Clinical Course    Labs:   Imaging:   Consults:   Therapeutics:   Discharge  Meds:  Assessment/Plan:    Final Clinical Impressions(s) / ED Diagnoses   Final diagnoses:  Ankle sprain, right, initial encounter  Avulsion fracture    25 year old female presents today with very small avulsion fracture of her right ankle. She has associated ankle sprain. Patient appears to be very elated, history of bipolar manic with substance abuse issues. Patient would not sit still throughout my exam. She will be placed in ankle splint, crutches, close follow-up with orthopedic specialist for reevaluation. Her mother was at bedside who verbalizes understanding and agreement to today's plan had no further questions or concerns at time discharge  New Prescriptions Discharge Medication List as of 10/14/2015  5:11 PM    I personally performed the services described in this documentation, which was scribed in my presence. The recorded information has been reviewed and is accurate.     Eyvonne Mechanic, PA-C 10/14/15 1733    Laurence Spates, MD 10/15/15 [phone]

## 2015-10-14 NOTE — Discharge Instructions (Signed)
Please follow-up with orthopedic surgeon this week for reevaluation further management.

## 2015-10-14 NOTE — ED Triage Notes (Signed)
Pt states that she twisted her R ankle today and thinks she may have broken it again. Broken 3 times prior. Alert and oriented. Foot pain as well.

## 2015-10-18 ENCOUNTER — Emergency Department (HOSPITAL_BASED_OUTPATIENT_CLINIC_OR_DEPARTMENT_OTHER)
Admission: EM | Admit: 2015-10-18 | Discharge: 2015-10-18 | Disposition: A | Discharge status: Home / Self Care | Payer: Commercial Managed Care - PPO | Attending: Emergency Medicine | Admitting: Emergency Medicine

## 2015-10-18 ENCOUNTER — Encounter (HOSPITAL_BASED_OUTPATIENT_CLINIC_OR_DEPARTMENT_OTHER): Payer: Self-pay | Admitting: Emergency Medicine

## 2015-10-18 ENCOUNTER — Ambulatory Visit: Payer: Commercial Managed Care - PPO

## 2015-10-18 DIAGNOSIS — F17210 Nicotine dependence, cigarettes, uncomplicated: Secondary | ICD-10-CM | POA: Insufficient documentation

## 2015-10-18 DIAGNOSIS — J029 Acute pharyngitis, unspecified: Secondary | ICD-10-CM | POA: Diagnosis present

## 2015-10-18 DIAGNOSIS — H9209 Otalgia, unspecified ear: Secondary | ICD-10-CM | POA: Insufficient documentation

## 2015-10-18 DIAGNOSIS — J020 Streptococcal pharyngitis: Secondary | ICD-10-CM | POA: Insufficient documentation

## 2015-10-18 DIAGNOSIS — R062 Wheezing: Secondary | ICD-10-CM

## 2015-10-18 LAB — RAPID STREP SCREEN (MED CTR MEBANE ONLY): STREPTOCOCCUS, GROUP A SCREEN (DIRECT): POSITIVE — AB

## 2015-10-18 MED ORDER — HYDROCOD POLST-CPM POLST ER 10-8 MG/5ML PO SUER: 5.0000 mL | Freq: Two times a day (BID) | ORAL | 0 refills | Status: DC | PRN

## 2015-10-18 MED ORDER — AMOXICILLIN 500 MG PO CAPS
500.0000 mg | ORAL_CAPSULE | Freq: Three times a day (TID) | ORAL | 0 refills | Status: DC
Start: 2015-10-18 — End: 2016-03-03

## 2015-10-18 MED ORDER — ALBUTEROL SULFATE HFA 108 (90 BASE) MCG/ACT IN AERS
1.0000 | INHALATION_SPRAY | RESPIRATORY_TRACT | Status: DC | PRN
  Administered 2015-10-18: 2 via RESPIRATORY_TRACT
  Filled 2015-10-18: qty 6.7

## 2015-10-18 NOTE — ED Provider Notes (Signed)
[facility]   CSN: [id number] Arrival date & time: 10/18/15  0957     History   Chief Complaint Chief Complaint  Patient presents with  . Sore Throat    HPI [name] is a 25 y.o. female who presents to the ED with a sore throat that started a week ago and has gotten worse. She reports cough and congestion and wheezing. She is an every day smoker.   The history is provided by the patient. No language interpreter was used.  Sore Throat  This is a new problem. The current episode started more than 2 days ago. The problem occurs constantly. The problem has been gradually worsening. Pertinent negatives include no abdominal pain and no headaches. Nothing relieves the symptoms. Treatments tried: OTC medications. The treatment provided no relief.    Past Medical History:  Diagnosis Date  . Anxiety   . Atrial septal defect    corrected 1999  . Bipolar disorder (HCC)   . Depression   . Heart murmur   . Mental health problem   . Self-mutilation   . Substance abuse     Patient Active Problem List   Diagnosis Date Noted  . Bipolar disorder, current episode manic w/o psychotic features, severe ([facility]) 09/25/2015  . Cocaine use disorder, mild, abuse 09/25/2015  . Cannabis use disorder, severe, dependence ([facility]) 09/25/2015  . Tobacco use disorder 09/25/2015  . Opioid use disorder, moderate, dependence ([facility]) 09/25/2015  . Attention deficit disorder 05/01/2014  . Chronic back pain 03/05/2013  . BMI 27.0-27.9,adult 03/05/2013    Past Surgical History:  Procedure Laterality Date  . ASD REPAIR      OB History    No data available       Home Medications    Prior to Admission medications   Medication Sig Start Date End Date Taking? Authorizing Provider  amoxicillin (AMOXIL) 500 MG capsule Take 1 capsule (500 mg total) by mouth 3 (three) times daily. 10/18/15   Nelle Sayed Orlene OchM Keneshia Tena, NP  ARIPiprazole (ABILIFY) 15 MG tablet Take 1 tablet (15 mg total) by  mouth at bedtime. 09/30/15   Oneta Rackanika N Lewis, NP  ARIPiprazole ER 400 MG SUSR Inject 400 mg into the muscle every 28 (twenty-eight) days. To be give by PCP (Primary Care Provider) 10/26/15   Oneta Rackanika N Lewis, NP  chlorpheniramine-HYDROcodone (TUSSIONEX PENNKINETIC ER) 10-8 MG/5ML SUER Take 5 mLs by mouth every 12 (twelve) hours as needed for cough. 10/18/15   Destynee Stringfellow Orlene OchM Makeshia Seat, NP  divalproex (DEPAKOTE) 500 MG DR tablet Take 1 tablet (500 mg total) by mouth every 12 (twelve) hours. 09/30/15   Oneta Rackanika N Lewis, NP  gabapentin (NEURONTIN) 300 MG capsule Take 1 capsule (300 mg total) by mouth 3 (three) times daily. 09/30/15   Oneta Rackanika N Lewis, NP  ibuprofen (ADVIL,MOTRIN) 800 MG tablet Take 1 tablet (800 mg total) by mouth every 8 (eight) hours as needed. 09/26/15   Charlestine Nighthristopher Lawyer, PA-C  nicotine polacrilex (NICORETTE) 2 MG gum Take 1 each (2 mg total) by mouth as needed for smoking cessation. 09/30/15   Oneta Rackanika N Lewis, NP  traZODone (DESYREL) 100 MG tablet Take 1 tablet (100 mg total) by mouth at bedtime as needed for sleep (agitation). 09/30/15   Oneta Rackanika N Lewis, NP    Family History Family History  Problem Relation Age of Onset  . Cancer Mother     breast - both  . Arthritis Maternal Grandmother     rheumatoid  . Cancer Maternal  Grandfather     brain  . Alzheimer's disease Paternal Grandmother   . Mental illness Neg Hx   . Drug abuse Neg Hx   . Suicidality Neg Hx     Social History Social History  Substance Use Topics  . Smoking status: Current Every Day Smoker    Packs/day: 2.00    Years: 7.00    Types: Cigarettes  . Smokeless tobacco: Never Used  . Alcohol use Yes     Comment: a few a week     Allergies   Review of patient's allergies indicates no known allergies.   Review of Systems Review of Systems  HENT: Positive for congestion, ear pain and sore throat.   Eyes: Negative for pain, redness and itching.  Respiratory: Positive for cough and wheezing.   Gastrointestinal: Negative for  abdominal pain. Diarrhea: one day ago, none now.  Genitourinary: Negative for dysuria, frequency and urgency.  Musculoskeletal: Negative for neck stiffness.  Skin: Negative for rash.  Neurological: Negative for syncope and headaches.  Hematological: Positive for adenopathy.  Psychiatric/Behavioral: Negative for confusion. The patient is not nervous/anxious.      Physical Exam Updated Vital Signs BP 114/71 (BP Location: Left Arm)   Pulse 86   Temp 98.5 F (36.9 C) (Oral)   Resp 18   Ht 5\' 10"  (1.778 m)   Wt 70.3 kg   LMP 09/19/2015 (Approximate)   SpO2 100%   BMI 22.24 kg/m   Physical Exam  Constitutional: She is oriented to person, place, and time. She appears well-developed and well-nourished. No distress.  HENT:  Right Ear: Tympanic membrane normal.  Left Ear: Tympanic membrane normal.  Nose: Nose normal.  Mouth/Throat: Uvula is midline. Posterior oropharyngeal erythema present. Tonsillar exudate.  Eyes: EOM are normal. Pupils are equal, round, and reactive to light.  Neck: Neck supple.  Cardiovascular: Normal rate and regular rhythm.   Pulmonary/Chest: Effort normal. She has wheezes.  Abdominal: Soft. Bowel sounds are normal. There is no tenderness.  Musculoskeletal: Normal range of motion.  Lymphadenopathy:    She has cervical adenopathy.  Neurological: She is alert and oriented to person, place, and time. No cranial nerve deficit.  Skin: Skin is warm and dry.  Psychiatric: She has a normal mood and affect. Her behavior is normal.  Nursing note and vitals reviewed.    ED Treatments / Results  Labs (all labs ordered are listed, but only abnormal results are displayed) Labs Reviewed  RAPID STREP SCREEN (NOT AT [facility]) - Abnormal; Notable for the following:       Result Value   Streptococcus, Group A Screen (Direct) POSITIVE (*)    All other components within normal limits     Radiology No results found.  Procedures Procedures (including critical care  time)  Medications Ordered in ED Medications - No data to display   Initial Impression / Assessment and Plan / ED Course  I have reviewed the triage vital signs and the nursing notes.  Pertinent labs & imaging results that were available during my care of the patient were reviewed by me and considered in my medical decision making (see chart for details).  Clinical Course  Albuterol Inhaler and instructions to decrease smoking.   Final Clinical Impressions(s) / ED Diagnoses  25 y.o. female with sore throat, cough and wheezing stable for d/c without difficulty swallowing and O2 SAT 100% on R/A. Will treat for positive strep and cough. She was instructed on use of albuterol inhaler.   Final diagnoses:  Strep throat  Wheezing    New Prescriptions Discharge Medication List as of 10/18/2015 10:56 AM    START taking these medications   Details  amoxicillin (AMOXIL) 500 MG capsule Take 1 capsule (500 mg total) by mouth 3 (three) times daily., Starting Fri 10/18/2015, Print    chlorpheniramine-HYDROcodone (TUSSIONEX PENNKINETIC ER) 10-8 MG/5ML SUER Take 5 mLs by mouth every 12 (twelve) hours as needed for cough., Starting Fri 10/18/2015, Print         [place], NP 10/18/15 1628    Tilden Fossa, MD 10/19/15 1355

## 2015-10-18 NOTE — ED Triage Notes (Signed)
Patient reports for the last few days having a sore throat with a cough.

## 2015-10-18 NOTE — Discharge Instructions (Signed)
You can use ibuprofen in addition to the other medications, use salt water gargles and return as needed for worsening symptoms.

## 2016-03-02 ENCOUNTER — Ambulatory Visit: Payer: Commercial Managed Care - PPO

## 2016-03-03 ENCOUNTER — Ambulatory Visit (INDEPENDENT_AMBULATORY_CARE_PROVIDER_SITE_OTHER): Payer: Commercial Managed Care - PPO | Admitting: Emergency Medicine

## 2016-03-03 VITALS — BP 124/80 | HR 94 | Temp 98.4°F | Resp 18 | Ht 70.0 in | Wt 161.0 lb

## 2016-03-03 DIAGNOSIS — F988 Other specified behavioral and emotional disorders with onset usually occurring in childhood and adolescence: Secondary | ICD-10-CM

## 2016-03-03 DIAGNOSIS — F411 Generalized anxiety disorder: Secondary | ICD-10-CM | POA: Diagnosis not present

## 2016-03-03 DIAGNOSIS — F250 Schizoaffective disorder, bipolar type: Secondary | ICD-10-CM

## 2016-03-03 DIAGNOSIS — F17200 Nicotine dependence, unspecified, uncomplicated: Secondary | ICD-10-CM

## 2016-03-03 MED ORDER — GABAPENTIN 300 MG PO CAPS: 300.0000 mg | ORAL_CAPSULE | Freq: Three times a day (TID) | ORAL | 6 refills | Status: DC

## 2016-03-03 NOTE — Progress Notes (Signed)
[name] 26 y.o.   Chief Complaint  Patient presents with  . Medication Problem    NEEDS TO START BACK ON XANAX  . Anxiety    HISTORY OF PRESENT ILLNESS: This is a 26 y.o. female requesting to be started on Xanax again; last took it 6 months ago. Off Neurontin also. Pt has h/o chronic anxiety since age 26 and has h/o substance abuse.  HPI   Prior to Admission medications   Medication Sig Start Date End Date Taking? Authorizing Provider  ARIPiprazole (ABILIFY) 15 MG tablet Take 1 tablet (15 mg total) by mouth at bedtime. Patient not taking: Reported on 03/03/2016 09/30/15   Oneta Rack, NP  ARIPiprazole ER 400 MG SUSR Inject 400 mg into the muscle every 28 (twenty-eight) days. To be give by PCP (Primary Care Provider) Patient not taking: Reported on 03/03/2016 10/26/15   Oneta Rack, NP  divalproex (DEPAKOTE) 500 MG DR tablet Take 1 tablet (500 mg total) by mouth every 12 (twelve) hours. Patient not taking: Reported on 03/03/2016 09/30/15   Oneta Rack, NP  gabapentin (NEURONTIN) 300 MG capsule Take 1 capsule (300 mg total) by mouth 3 (three) times daily. 03/03/16   Georgina Quint, MD  ibuprofen (ADVIL,MOTRIN) 800 MG tablet Take 1 tablet (800 mg total) by mouth every 8 (eight) hours as needed. Patient not taking: Reported on 03/03/2016 09/26/15   Charlestine Night, PA-C  nicotine polacrilex (NICORETTE) 2 MG gum Take 1 each (2 mg total) by mouth as needed for smoking cessation. Patient not taking: Reported on 03/03/2016 09/30/15   Oneta Rack, NP  traZODone (DESYREL) 100 MG tablet Take 1 tablet (100 mg total) by mouth at bedtime as needed for sleep (agitation). Patient not taking: Reported on 03/03/2016 09/30/15   Oneta Rack, NP    No Known Allergies  Patient Active Problem List   Diagnosis Date Noted  . Bipolar disorder, current episode manic w/o psychotic features, severe (HCC) 09/25/2015  . Cocaine use disorder, mild, abuse 09/25/2015  . Cannabis use disorder, severe,  dependence (HCC) 09/25/2015  . Tobacco use disorder 09/25/2015  . Opioid use disorder, moderate, dependence (HCC) 09/25/2015  . Attention deficit disorder 05/01/2014  . Chronic back pain 03/05/2013  . BMI 27.0-27.9,adult 03/05/2013    Past Medical History:  Diagnosis Date  . Anxiety   . Atrial septal defect    corrected 1999  . Bipolar disorder (HCC)   . Depression   . Heart murmur   . Mental health problem   . Self-mutilation   . Substance abuse     Past Surgical History:  Procedure Laterality Date  . ASD REPAIR      Social History   Social History  . Marital status: Single    Spouse name: N/A  . Number of children: N/A  . Years of education: N/A   Occupational History  . Not on file.   Social History Main Topics  . Smoking status: Current Every Day Smoker    Packs/day: 2.00    Years: 7.00    Types: Cigarettes  . Smokeless tobacco: Never Used  . Alcohol use Yes     Comment: a few a week  . Drug use: Unknown    Frequency: 3.0 times per week    Types: Marijuana, Cocaine     Comment: States no per pt  . Sexual activity: Yes    Birth control/ protection: None     Comment: number of sex partners in  the last 12 months 1   Other Topics Concern  . Not on file   Social History Narrative  . No narrative on file    Family History  Problem Relation Age of Onset  . Cancer Mother     breast - both  . Arthritis Maternal Grandmother     rheumatoid  . Cancer Maternal Grandfather     brain  . Alzheimer's disease Paternal Grandmother   . Mental illness Neg Hx   . Drug abuse Neg Hx   . Suicidality Neg Hx      Review of Systems  Constitutional: Negative for chills, fever and weight loss.  HENT: Negative for congestion, nosebleeds, sinus pain and sore throat.   Eyes: Negative for blurred vision, pain and discharge.  Respiratory: Negative for cough, shortness of breath and wheezing.   Cardiovascular: Negative for chest pain, palpitations and leg swelling.   Gastrointestinal: Negative for abdominal pain, constipation, diarrhea, heartburn, nausea and vomiting.  Genitourinary: Negative for dysuria, flank pain and hematuria.  Musculoskeletal: Negative for myalgias and neck pain.  Skin: Negative for rash.  Neurological: Negative for dizziness, sensory change, focal weakness and headaches.  Endo/Heme/Allergies: Does not bruise/bleed easily.  Psychiatric/Behavioral: The patient is nervous/anxious.   All other systems reviewed and are negative.  Vitals:   03/03/16 1342  BP: 124/80  Pulse: 94  Resp: 18  Temp: 98.4 F (36.9 C)    Physical Exam  Constitutional: She is oriented to person, place, and time. She appears well-developed and well-nourished.  HENT:  Head: Normocephalic and atraumatic.  Mouth/Throat: Oropharynx is clear and moist.  Eyes: Conjunctivae and EOM are normal. Pupils are equal, round, and reactive to light.  Neck: Normal range of motion. Neck supple.  Cardiovascular: Normal rate and regular rhythm.   Pulmonary/Chest: Effort normal and breath sounds normal.  Musculoskeletal: Normal range of motion.  Neurological: She is alert and oriented to person, place, and time. No sensory deficit. She exhibits normal muscle tone.  Skin: Skin is warm and dry. Capillary refill takes less than 2 seconds.  Psychiatric: She has a normal mood and affect. Her behavior is normal.  Vitals reviewed.    ASSESSMENT & PLAN: Pt has a h/o substance abuse. Narcotics should not be prescribed. Her request for Xanax/Klonopin denied and other options explained. States Neurontin always helps her anxiety. Fendi was seen today for medication problem and anxiety.  Diagnoses and all orders for this visit:  GAD (generalized anxiety disorder) -     Ambulatory referral to Psychiatry  Schizoaffective disorder, bipolar type (HCC)  TOBACCO USER  Attention deficit disorder, unspecified hyperactivity presence  Other orders -     Discontinue: gabapentin  (NEURONTIN) 300 MG capsule; Take 1 capsule (300 mg total) by mouth 3 (three) times daily. -     gabapentin (NEURONTIN) 300 MG capsule; Take 1 capsule (300 mg total) by mouth 3 (three) times daily.       Edwina BarthMiguel Ansh Fauble, MD [facility]

## 2016-03-03 NOTE — Patient Instructions (Signed)
     IF you received an x-ray today, you will receive an invoice from [facility]. Please contact [facility] at [phone] with questions or concerns regarding your invoice.   IF you received labwork today, you will receive an invoice from [facility]. Please contact [facility] at [phone] with questions or concerns regarding your invoice.   Our billing staff will not be able to assist you with questions regarding bills from these companies.  You will be contacted with the lab results as soon as they are available. The fastest way to get your results is to activate your My Chart account. Instructions are located on the last page of this paperwork. If you have not heard from us regarding the results in 2 weeks, please contact this office.     

## 2016-04-08 ENCOUNTER — Telehealth: Payer: Self-pay

## 2016-04-08 NOTE — Telephone Encounter (Signed)
Pa needed for gabapentin Key [id number]

## 2016-04-13 NOTE — Telephone Encounter (Signed)
Using Key Code [id number] I completed patient's PA.  It was denied due to patient's age..  Please advise next step.  Thank you.

## 2016-04-13 NOTE — Telephone Encounter (Signed)
Dr. Terence LuxSargardia, I called the patient to let her know the insurance would not cover her medication, and she told me she does not have insurance.  There is an active insurance card on file in her chart, so I am not sure why she said this.  I told her she was being referred to psychiatry and they should handle her medications.  Again, she stated that she does not have insurance and she can't afford to go to psyche.  Is there anything else you recommend I do for you?  If so, just let me know. Thanks, [employer]

## 2016-04-13 NOTE — Telephone Encounter (Signed)
Thank you, Renay. Nothing else I recommend as of now.

## 2016-04-13 NOTE — Telephone Encounter (Signed)
Patient has very complicated psychiatric history and polysubstance abuse. Not very well known to me. She was referred to Psych last visit. Her case and medication issues should be handled by a Psychiatrist.

## 2016-06-23 ENCOUNTER — Ambulatory Visit (HOSPITAL_COMMUNITY)
Admission: RE | Admit: 2016-06-23 | Discharge: 2016-06-23 | Disposition: A | Discharge status: Home / Self Care | Payer: Commercial Managed Care - PPO | Attending: Psychiatry | Admitting: Psychiatry

## 2016-06-24 NOTE — Progress Notes (Signed)
Irie presented as a walk-in and left without being seen, no reason given. Denver FasterVictoria Derricka Mertz RN-BC,BSN A/C

## 2016-06-29 ENCOUNTER — Ambulatory Visit (HOSPITAL_COMMUNITY)
Admission: RE | Admit: 2016-06-29 | Discharge: 2016-06-29 | Disposition: A | Discharge status: Home / Self Care | Payer: Commercial Managed Care - PPO | Attending: Psychiatry | Admitting: Psychiatry

## 2016-06-29 DIAGNOSIS — F1490 Cocaine use, unspecified, uncomplicated: Secondary | ICD-10-CM | POA: Insufficient documentation

## 2016-06-29 DIAGNOSIS — F319 Bipolar disorder, unspecified: Secondary | ICD-10-CM | POA: Diagnosis present

## 2016-06-29 DIAGNOSIS — F17210 Nicotine dependence, cigarettes, uncomplicated: Secondary | ICD-10-CM | POA: Diagnosis not present

## 2016-06-29 DIAGNOSIS — F1094 Alcohol use, unspecified with alcohol-induced mood disorder: Secondary | ICD-10-CM | POA: Insufficient documentation
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
## 2016-06-29 NOTE — ED Notes (Signed)
Behavioral Health sending pt for medical clearance related to ETOH/SI; call in person verbalizes pt meets inpatient criteria but has not been assigned a bed.
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
## 2016-09-19 ENCOUNTER — Emergency Department (HOSPITAL_COMMUNITY)
Admission: EM | Admit: 2016-09-19 | Discharge: 2016-09-19 | Disposition: A | Discharge status: Home / Self Care | Payer: Commercial Managed Care - PPO | Attending: Emergency Medicine | Admitting: Emergency Medicine

## 2016-09-19 ENCOUNTER — Encounter (HOSPITAL_COMMUNITY): Payer: Self-pay | Admitting: Emergency Medicine

## 2016-09-19 ENCOUNTER — Emergency Department (HOSPITAL_COMMUNITY): Payer: Commercial Managed Care - PPO

## 2016-09-19 DIAGNOSIS — Z5321 Procedure and treatment not carried out due to patient leaving prior to being seen by health care provider: Secondary | ICD-10-CM | POA: Insufficient documentation

## 2016-09-19 DIAGNOSIS — R0602 Shortness of breath: Secondary | ICD-10-CM | POA: Insufficient documentation

## 2016-09-19 NOTE — ED Notes (Signed)
Pt came to desk stating she was leaving and would return if pain came pack
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
## 2017-01-30 ENCOUNTER — Emergency Department (HOSPITAL_COMMUNITY)
Admission: EM | Admit: 2017-01-30 | Discharge: 2017-01-30 | Disposition: A | Discharge status: Home / Self Care | Payer: Self-pay | Attending: Emergency Medicine | Admitting: Emergency Medicine

## 2017-01-30 ENCOUNTER — Encounter (HOSPITAL_COMMUNITY): Payer: Self-pay | Admitting: Emergency Medicine

## 2017-01-30 ENCOUNTER — Emergency Department (HOSPITAL_COMMUNITY): Payer: Self-pay

## 2017-01-30 DIAGNOSIS — Z9889 Other specified postprocedural states: Secondary | ICD-10-CM

## 2017-01-30 DIAGNOSIS — N760 Acute vaginitis: Secondary | ICD-10-CM | POA: Insufficient documentation

## 2017-01-30 DIAGNOSIS — N3000 Acute cystitis without hematuria: Secondary | ICD-10-CM | POA: Insufficient documentation

## 2017-01-30 DIAGNOSIS — B9689 Other specified bacterial agents as the cause of diseases classified elsewhere: Secondary | ICD-10-CM

## 2017-01-30 DIAGNOSIS — Z79899 Other long term (current) drug therapy: Secondary | ICD-10-CM | POA: Insufficient documentation

## 2017-01-30 DIAGNOSIS — F99 Mental disorder, not otherwise specified: Secondary | ICD-10-CM | POA: Insufficient documentation

## 2017-01-30 DIAGNOSIS — F909 Attention-deficit hyperactivity disorder, unspecified type: Secondary | ICD-10-CM | POA: Insufficient documentation

## 2017-01-30 DIAGNOSIS — F17210 Nicotine dependence, cigarettes, uncomplicated: Secondary | ICD-10-CM | POA: Insufficient documentation

## 2017-01-30 DIAGNOSIS — R1030 Lower abdominal pain, unspecified: Secondary | ICD-10-CM

## 2017-01-30 LAB — COMPREHENSIVE METABOLIC PANEL
ALT: 13 U/L — ABNORMAL LOW (ref 14–54)
ANION GAP: 11 (ref 5–15)
AST: 14 U/L — ABNORMAL LOW (ref 15–41)
Albumin: 3.4 g/dL — ABNORMAL LOW (ref 3.5–5.0)
Alkaline Phosphatase: 57 U/L (ref 38–126)
BILIRUBIN TOTAL: 1.1 mg/dL (ref 0.3–1.2)
BUN: 23 mg/dL — ABNORMAL HIGH (ref 6–20)
CHLORIDE: 105 mmol/L (ref 101–111)
CO2: 19 mmol/L — ABNORMAL LOW (ref 22–32)
Calcium: 8.5 mg/dL — ABNORMAL LOW (ref 8.9–10.3)
Creatinine, Ser: 1.01 mg/dL — ABNORMAL HIGH (ref 0.44–1.00)
GFR calc Af Amer: >60 mL/min (ref >60)
Glucose, Bld: 125 mg/dL — ABNORMAL HIGH (ref 65–99)
POTASSIUM: 3.8 mmol/L (ref 3.5–5.1)
Sodium: 135 mmol/L (ref 135–145)
TOTAL PROTEIN: 7 g/dL (ref 6.5–8.1)

## 2017-01-30 LAB — URINALYSIS, ROUTINE W REFLEX MICROSCOPIC
GLUCOSE, UA: NEGATIVE mg/dL
Ketones, ur: 5 mg/dL — AB
NITRITE: NEGATIVE
PH: 5 (ref 5.0–8.0)
Protein, ur: 30 mg/dL — AB
SPECIFIC GRAVITY, URINE: 1.034 — AB (ref 1.005–1.030)

## 2017-01-30 LAB — WET PREP, GENITAL
SPERM: NONE SEEN
Trich, Wet Prep: NONE SEEN
Yeast Wet Prep HPF POC: NONE SEEN

## 2017-01-30 LAB — LIPASE, BLOOD: LIPASE: 16 U/L (ref 11–51)

## 2017-01-30 LAB — I-STAT BETA HCG BLOOD, ED (MC, WL, AP ONLY): I-stat hCG, quantitative: 104.6 m[IU]/mL — ABNORMAL HIGH (ref <5)

## 2017-01-30 LAB — CBC
HEMATOCRIT: 40.1 % (ref 36.0–46.0)
HEMOGLOBIN: 14.1 g/dL (ref 12.0–15.0)
MCH: 32.3 pg (ref 26.0–34.0)
MCHC: 35.2 g/dL (ref 30.0–36.0)
MCV: 91.8 fL (ref 78.0–100.0)
Platelets: 248 10*3/uL (ref 150–400)
RBC: 4.37 MIL/uL (ref 3.87–5.11)
RDW: 14.9 % (ref 11.5–15.5)
WBC: 27 10*3/uL — AB (ref 4.0–10.5)

## 2017-01-30 MED ORDER — METRONIDAZOLE 500 MG PO TABS: 500.0000 mg | ORAL_TABLET | Freq: Two times a day (BID) | ORAL | 0 refills | Status: DC

## 2017-01-30 MED ORDER — IOPAMIDOL (ISOVUE-300) INJECTION 61%
INTRAVENOUS | Status: AC
  Administered 2017-01-30: 100 mL via INTRAVENOUS
  Filled 2017-01-30: qty 100

## 2017-01-30 MED ORDER — IOPAMIDOL (ISOVUE-300) INJECTION 61%
100.0000 mL | Freq: Once | INTRAVENOUS | Status: AC | PRN
  Administered 2017-01-30: 100 mL via INTRAVENOUS

## 2017-01-30 MED ORDER — MORPHINE SULFATE (PF) 4 MG/ML IV SOLN
4.0000 mg | Freq: Once | INTRAVENOUS | Status: AC
  Administered 2017-01-30: 4 mg via INTRAVENOUS
  Filled 2017-01-30: qty 1

## 2017-01-30 MED ORDER — SULFAMETHOXAZOLE-TRIMETHOPRIM 800-160 MG PO TABS: 1.0000 | ORAL_TABLET | Freq: Two times a day (BID) | ORAL | 0 refills | Status: AC

## 2017-01-30 MED ORDER — SODIUM CHLORIDE 0.9 % IV BOLUS (SEPSIS)
1000.0000 mL | Freq: Once | INTRAVENOUS | Status: AC
  Administered 2017-01-30: 1000 mL via INTRAVENOUS

## 2017-01-30 MED ORDER — SODIUM CHLORIDE 0.9 % IJ SOLN
INTRAMUSCULAR | Status: AC
  Administered 2017-01-30: 12:00:00
  Filled 2017-01-30: qty 50

## 2017-01-30 MED ORDER — DEXTROSE 5 % IV SOLN
1.0000 g | Freq: Once | INTRAVENOUS | Status: AC
  Administered 2017-01-30: 1 g via INTRAVENOUS
  Filled 2017-01-30: qty 10

## 2017-01-30 MED ORDER — ONDANSETRON HCL 4 MG/2ML IJ SOLN
4.0000 mg | Freq: Once | INTRAMUSCULAR | Status: AC
  Administered 2017-01-30: 4 mg via INTRAVENOUS
  Filled 2017-01-30: qty 2

## 2017-01-30 MED ORDER — HYDROCODONE-ACETAMINOPHEN 5-325 MG PO TABS: 2.0000 | ORAL_TABLET | ORAL | 0 refills | Status: DC | PRN

## 2017-01-30 NOTE — ED Provider Notes (Signed)
[facility]-EMERGENCY DEPT Provider Note   CSN: [id number] Arrival date & time: 01/30/17  4098     History   Chief Complaint Chief Complaint  Patient presents with  . Abdominal Pain    HPI [name] is a 27 y.o. female.  Pt presents with 2 days of abdominal pain and subjective fevers.  She had an elective 18-week abortion on 01/23/2017.  States she was completely fine after the abortion, only having minor bleeding and no cramping.  Started having LLQ abdominal pain 2 days ago, that progressed to the RLQ as well.  States L>R in terms of pain.  Pain is cyclic in nature, cramping and very tight in character, and only lasting for 30 secs at a time.  Pain is similar to menstrual cramps, but much more intense.  Pt was seen at [facility] yesterday where she received a TVUS that revealed no clots or retained products of conception.  Pt notes that she has also started having diarrhea that is related to any food intake these last 2 days.  There is no blood or mucus in it.  No recent antibiotics.        Past Medical History:  Diagnosis Date  . Anxiety   . Atrial septal defect    corrected 1999  . Bipolar disorder (HCC)   . Depression   . Heart murmur   . Mental health problem   . Self-mutilation   . Substance abuse [facility])     Patient Active Problem List   Diagnosis Date Noted  . Bipolar disorder, current episode manic w/o psychotic features, severe (HCC) 09/25/2015  . Cocaine use disorder, mild, abuse (HCC) 09/25/2015  . Cannabis use disorder, severe, dependence (HCC) 09/25/2015  . Tobacco use disorder 09/25/2015  . Opioid use disorder, moderate, dependence (HCC) 09/25/2015  . Attention deficit disorder 05/01/2014  . Chronic back pain 03/05/2013  . BMI 27.0-27.9,adult 03/05/2013    Past Surgical History:  Procedure Laterality Date  . ASD REPAIR      OB History    No data available       Home Medications    Prior to Admission  medications   Medication Sig Start Date End Date Taking? Authorizing Provider  diphenhydramine-acetaminophen (TYLENOL PM) 25-500 MG TABS tablet Take 1 tablet by mouth at bedtime as needed (sleep).   Yes [provider]  ARIPiprazole (ABILIFY) 15 MG tablet Take 1 tablet (15 mg total) by mouth at bedtime. Patient not taking: Reported on 03/03/2016 09/30/15   Oneta Rack, NP  ARIPiprazole ER 400 MG SUSR Inject 400 mg into the muscle every 28 (twenty-eight) days. To be give by PCP (Primary Care Provider) Patient not taking: Reported on 03/03/2016 10/26/15   Oneta Rack, NP  divalproex (DEPAKOTE) 500 MG DR tablet Take 1 tablet (500 mg total) by mouth every 12 (twelve) hours. Patient not taking: Reported on 03/03/2016 09/30/15   Oneta Rack, NP  gabapentin (NEURONTIN) 300 MG capsule Take 1 capsule (300 mg total) by mouth 3 (three) times daily. Patient not taking: Reported on 01/30/2017 03/03/16   Georgina Quint, MD  HYDROcodone-acetaminophen (NORCO/VICODIN) 5-325 MG tablet Take 2 tablets by mouth every 4 (four) hours as needed. 01/30/17   Jacalyn Lefevre, MD  ibuprofen (ADVIL,MOTRIN) 800 MG tablet Take 1 tablet (800 mg total) by mouth every 8 (eight) hours as needed. Patient not taking: Reported on 03/03/2016 09/26/15   Charlestine Night, PA-C  metroNIDAZOLE (FLAGYL) 500 MG tablet Take 1  tablet (500 mg total) by mouth 2 (two) times daily. 01/30/17   Jacalyn Lefevre, MD  nicotine polacrilex (NICORETTE) 2 MG gum Take 1 each (2 mg total) by mouth as needed for smoking cessation. Patient not taking: Reported on 03/03/2016 09/30/15   Oneta Rack, NP  sulfamethoxazole-trimethoprim (BACTRIM DS,SEPTRA DS) 800-160 MG tablet Take 1 tablet by mouth 2 (two) times daily for 7 days. 01/30/17 02/06/17  Jacalyn Lefevre, MD  traZODone (DESYREL) 100 MG tablet Take 1 tablet (100 mg total) by mouth at bedtime as needed for sleep (agitation). Patient not taking: Reported on 03/03/2016 09/30/15   Oneta Rack, NP     Family History Family History  Problem Relation Age of Onset  . Cancer Mother        breast - both  . Arthritis Maternal Grandmother        rheumatoid  . Cancer Maternal Grandfather        brain  . Alzheimer's disease Paternal Grandmother   . Mental illness Neg Hx   . Drug abuse Neg Hx   . Suicidality Neg Hx     Social History Social History   Tobacco Use  . Smoking status: Current Every Day Smoker    Packs/day: 2.00    Years: 7.00    Pack years: 14.00    Types: Cigarettes  . Smokeless tobacco: Never Used  Substance Use Topics  . Alcohol use: Yes    Comment: a few a week  . Drug use: Unknown    Frequency: 3.0 times per week    Types: Marijuana, Cocaine    Comment: States no per pt     Allergies   Patient has no known allergies.   Review of Systems Review of Systems  Constitutional: Positive for chills.  Gastrointestinal: Positive for abdominal pain and diarrhea.  Genitourinary: Positive for flank pain and vaginal bleeding.  All other systems reviewed and are negative.    Physical Exam Updated Vital Signs BP 116/69 (BP Location: Left Arm)   Pulse 89   Temp (!) 97.5 F (36.4 C) (Oral)   Resp 16   Ht 5' 10.5" (1.791 m)   Wt 72.6 kg (160 lb)   SpO2 99%   BMI 22.63 kg/m   Physical Exam  Constitutional: She is oriented to person, place, and time.  HENT:  Head: Normocephalic and atraumatic.  Mouth/Throat: Oropharynx is clear and moist.  Eyes: EOM are normal.  Cardiovascular: Normal rate, regular rhythm, normal heart sounds and intact distal pulses.  Pulmonary/Chest: Effort normal.  Abdominal: Bowel sounds are increased. There is tenderness in the right lower quadrant and left lower quadrant. There is rigidity, guarding and CVA tenderness. There is no rebound.  Genitourinary: Uterus is tender. Right adnexum displays no mass and no tenderness. Left adnexum displays no mass and no tenderness. There is bleeding in the vagina.  Neurological: She is  alert and oriented to person, place, and time.  Skin: Skin is warm and dry. Capillary refill takes less than 2 seconds.  Psychiatric: She has a normal mood and affect. Her behavior is normal.  Nursing note and vitals reviewed.    ED Treatments / Results  Labs (all labs ordered are listed, but only abnormal results are displayed) Labs Reviewed  WET PREP, GENITAL - Abnormal; Notable for the following components:      Result Value   Clue Cells Wet Prep HPF POC PRESENT (*)    WBC, Wet Prep HPF POC MANY (*)    All  other components within normal limits  COMPREHENSIVE METABOLIC PANEL - Abnormal; Notable for the following components:   CO2 19 (*)    Glucose, Bld 125 (*)    BUN 23 (*)    Creatinine, Ser 1.01 (*)    Calcium 8.5 (*)    Albumin 3.4 (*)    AST 14 (*)    ALT 13 (*)    All other components within normal limits  CBC - Abnormal; Notable for the following components:   WBC 27.0 (*)    All other components within normal limits  URINALYSIS, ROUTINE W REFLEX MICROSCOPIC - Abnormal; Notable for the following components:   Color, Urine AMBER (*)    APPearance CLOUDY (*)    Specific Gravity, Urine 1.034 (*)    Hgb urine dipstick SMALL (*)    Bilirubin Urine MODERATE (*)    Ketones, ur 5 (*)    Protein, ur 30 (*)    Leukocytes, UA SMALL (*)    Bacteria, UA MANY (*)    Squamous Epithelial / LPF 0-5 (*)    Non Squamous Epithelial 0-5 (*)    All other components within normal limits  I-STAT BETA HCG BLOOD, ED ([facility], [facility], [facility] ONLY) - Abnormal; Notable for the following components:   I-stat hCG, quantitative 104.6 (*)    All other components within normal limits  LIPASE, BLOOD  GC/CHLAMYDIA PROBE AMP ([place]) NOT AT [facility]    EKG  EKG Interpretation None       Radiology Ct Abdomen Pelvis W Contrast  Result Date: 01/30/2017 CLINICAL DATA:  Lower abdominal pain, recent abortion 01/23/2017, elevated HCG EXAM: CT ABDOMEN AND PELVIS WITH CONTRAST TECHNIQUE: Multidetector CT  imaging of the abdomen and pelvis was performed using the standard protocol following bolus administration of intravenous contrast. Sagittal and coronal MPR images reconstructed from axial data set. CONTRAST:  100 mL Isovue-300 IV COMPARISON:  None FINDINGS: Lower chest: Lung bases clear Hepatobiliary: Significantly distended gallbladder 11.9 x 5.5 x 6.3 cm without definite calculi or wall thickening liver unremarkable. No biliary dilatation. Pancreas: Normal appearance Spleen: Normal appearance Adrenals/Urinary Tract: Adrenal glands and kidneys normal appearance. No hydronephrosis or definite urinary tract calcification. Bladder decompressed, unable to assess. Stomach/Bowel: Appendix obscured. Stomach and bowel loops grossly unremarkable for exam lacking GI contrast. Numerous bowel loops in pelvis adnexa and into cul de sac. Vascular/Lymphatic: Unremarkable Reproductive: Prominent uterine size compatible with recent pregnancy. No definite uterine fluid collection. Asymmetric enhancement at anterior uterine wall nonspecific. Ovaries obscured by bowel loops in pelvis. Other: No free air or free fluid.  No hernia. Musculoskeletal: Normal appearance IMPRESSION: No definite acute intrapelvic process is identified though assessment is suboptimal due to lack of GI opacification and adequate distention. Significantly distended gallbladder without definite gallstones or wall thickening. Electronically Signed   By: Ulyses Southward M.D.   On: 01/30/2017 12:23    Procedures Procedures (including critical care time)  Medications Ordered in ED Medications  cefTRIAXone (ROCEPHIN) 1 g in dextrose 5 % 50 mL IVPB (1 g Intravenous New Bag/Given 01/30/17 1228)  sodium chloride 0.9 % bolus 1,000 mL (0 mLs Intravenous Stopped 01/30/17 1227)  morphine 4 MG/ML injection 4 mg (4 mg Intravenous Given 01/30/17 1116)  ondansetron (ZOFRAN) injection 4 mg (4 mg Intravenous Given 01/30/17 1116)  sodium chloride 0.9 % injection (  Given 01/30/17  1145)  iopamidol (ISOVUE-300) 61 % injection 100 mL (100 mLs Intravenous Contrast Given 01/30/17 1144)     Initial Impression / Assessment and  Plan / ED Course  I have reviewed the triage vital signs and the nursing notes.  Pertinent labs & imaging results that were available during my care of the patient were reviewed by me and considered in my medical decision making (see chart for details).  Clinical Course as of Jan 31 1235  Sat Jan 30, 2017  1132 CT Abdomen Pelvis W Contrast [JH]    Clinical Course User Index [JH] Jacalyn LefevreHaviland, Royston Bekele, MD   [facility] but [facility] never [facility]ed it.  Pt is feeling better.  She will be given first dose of rocephin in ED.  She is told of the abnormal gb finding on CT, but she has no pain in the RUQ.  She is told to return if she hurts there.  Pt knows to return if worse.  F/u with gyn.  Final Clinical Impressions(s) / ED Diagnoses   Final diagnoses:  Acute cystitis without hematuria  Bacterial vaginosis  Status post elective abortion  Lower abdominal pain    ED Discharge Orders        Ordered    sulfamethoxazole-trimethoprim (BACTRIM DS,SEPTRA DS) 800-160 MG tablet  2 times daily     01/30/17 1236    metroNIDAZOLE (FLAGYL) 500 MG tablet  2 times daily     01/30/17 1236    HYDROcodone-acetaminophen (NORCO/VICODIN) 5-325 MG tablet  Every 4 hours PRN     01/30/17 1236       Jacalyn LefevreHaviland, Compton Brigance, MD 01/30/17 1238

## 2017-01-30 NOTE — ED Notes (Signed)
Pt had an abortion on 12/29, and went back to the clinic on 1/4 because of onset abdominal pain on 1/3. Ultrasound of the uterus showed only bleeding. Pt c/o abdominal pain and n/v/d. She took naproxen around midnight which she stated helped.

## 2017-01-30 NOTE — ED Notes (Signed)
Patient went to restroom but unable to get specimen at this time

## 2017-01-30 NOTE — ED Triage Notes (Signed)
Patient here with complaints of lower abdominal pain. Nausea, diarrhea. States that she had an abortion on 12/29 and did a follow up that was normal.

## 2017-01-30 NOTE — ED Notes (Signed)
Pt has taken 1 bite of [employer] sandwich, I informed pt she may not eat anything until MD has seen her.

## 2017-01-30 NOTE — ED Notes (Signed)
ED Provider at bedside conducting exam

## 2017-02-02 LAB — GC/CHLAMYDIA PROBE AMP (~~LOC~~) NOT AT ARMC
Chlamydia: NEGATIVE
NEISSERIA GONORRHEA: POSITIVE — AB

## 2017-08-06 ENCOUNTER — Emergency Department (HOSPITAL_COMMUNITY): Payer: Self-pay

## 2017-08-06 ENCOUNTER — Encounter (HOSPITAL_COMMUNITY): Payer: Self-pay | Admitting: Emergency Medicine

## 2017-08-06 ENCOUNTER — Emergency Department (HOSPITAL_COMMUNITY)
Admission: EM | Admit: 2017-08-06 | Discharge: 2017-08-07 | Disposition: A | Discharge status: Home / Self Care | Payer: Self-pay | Attending: Emergency Medicine | Admitting: Emergency Medicine

## 2017-08-06 DIAGNOSIS — F17210 Nicotine dependence, cigarettes, uncomplicated: Secondary | ICD-10-CM | POA: Insufficient documentation

## 2017-08-06 DIAGNOSIS — Z79899 Other long term (current) drug therapy: Secondary | ICD-10-CM | POA: Insufficient documentation

## 2017-08-06 DIAGNOSIS — T50904A Poisoning by unspecified drugs, medicaments and biological substances, undetermined, initial encounter: Secondary | ICD-10-CM

## 2017-08-06 DIAGNOSIS — T424X1A Poisoning by benzodiazepines, accidental (unintentional), initial encounter: Secondary | ICD-10-CM | POA: Insufficient documentation

## 2017-08-06 DIAGNOSIS — T50901A Poisoning by unspecified drugs, medicaments and biological substances, accidental (unintentional), initial encounter: Secondary | ICD-10-CM

## 2017-08-06 LAB — I-STAT BETA HCG BLOOD, ED (MC, WL, AP ONLY)

## 2017-08-06 MED ORDER — NALOXONE HCL 4 MG/10ML IJ SOLN
0.7000 mg/h | INTRAVENOUS | Status: DC
  Filled 2017-08-06: qty 10

## 2017-08-06 MED ORDER — SODIUM CHLORIDE 0.9 % IV BOLUS
1000.0000 mL | Freq: Once | INTRAVENOUS | Status: AC
  Administered 2017-08-07: 1000 mL via INTRAVENOUS

## 2017-08-06 MED ORDER — NALOXONE HCL 0.4 MG/ML IJ SOLN
0.4000 mg | Freq: Once | INTRAMUSCULAR | Status: AC
  Administered 2017-08-06: 0.4 mg via INTRAVENOUS
  Filled 2017-08-06: qty 1

## 2017-08-06 MED ORDER — SODIUM CHLORIDE 0.9 % IV SOLN: INTRAVENOUS | Status: DC

## 2017-08-06 MED ORDER — NALOXONE HCL 2 MG/2ML IJ SOSY
1.0000 mg | PREFILLED_SYRINGE | Freq: Once | INTRAMUSCULAR | Status: AC
  Administered 2017-08-06: 1 mg via INTRAVENOUS
  Filled 2017-08-06: qty 2

## 2017-08-06 MED ORDER — SODIUM CHLORIDE 0.9 % IV BOLUS
1000.0000 mL | Freq: Once | INTRAVENOUS | Status: AC
  Administered 2017-08-06: 1000 mL via INTRAVENOUS

## 2017-08-06 NOTE — ED Triage Notes (Addendum)
Brought by ems after bystanders called.  Found on the side of the road passed out.  Awakens to noxious stimuli.  When awake states I don't want to check in then goes back to sleep immediately.  Per ems Heart rate dropping to 40's at times.  Pt told ems she took xanax.  Unsure of how much.  When questioned in triage denies taking anything.

## 2017-08-06 NOTE — ED Provider Notes (Signed)
[facility] EMERGENCY DEPARTMENT Provider Note   CSN: [id number] Arrival date & time: 08/06/17  2209     History   Chief Complaint Chief Complaint  Patient presents with  . Drug Overdose    HPI [name] is a 27 y.o. female.  HPI  Patient is a 27 year old female with a history of ASD (corrected), polysubstance use presenting for altered mental status.  Patient was found on the ground by bystanders who called EMS.  Patient told EMS that she took Xanax.  Patient does not report any other medications, and does not answer questions at this time.  Level 5 caveat altered mental status.  Past Medical History:  Diagnosis Date  . Anxiety   . Atrial septal defect    corrected 1999  . Bipolar disorder (HCC)   . Depression   . Heart murmur   . Mental health problem   . Self-mutilation   . Substance abuse [facility])     Patient Active Problem List   Diagnosis Date Noted  . Bipolar disorder, current episode manic w/o psychotic features, severe (HCC) 09/25/2015  . Cocaine use disorder, mild, abuse (HCC) 09/25/2015  . Cannabis use disorder, severe, dependence (HCC) 09/25/2015  . Tobacco use disorder 09/25/2015  . Opioid use disorder, moderate, dependence (HCC) 09/25/2015  . Attention deficit disorder 05/01/2014  . Chronic back pain 03/05/2013  . BMI 27.0-27.9,adult 03/05/2013    Past Surgical History:  Procedure Laterality Date  . ASD REPAIR       OB History   None      Home Medications    Prior to Admission medications   Medication Sig Start Date End Date Taking? Authorizing Provider  ARIPiprazole (ABILIFY) 15 MG tablet Take 1 tablet (15 mg total) by mouth at bedtime. Patient not taking: Reported on 03/03/2016 09/30/15   Oneta Rack, NP  ARIPiprazole ER 400 MG SUSR Inject 400 mg into the muscle every 28 (twenty-eight) days. To be give by PCP (Primary Care Provider) Patient not taking: Reported on 03/03/2016 10/26/15   Oneta Rack, NP    diphenhydramine-acetaminophen (TYLENOL PM) 25-500 MG TABS tablet Take 1 tablet by mouth at bedtime as needed (sleep).    [provider]  divalproex (DEPAKOTE) 500 MG DR tablet Take 1 tablet (500 mg total) by mouth every 12 (twelve) hours. Patient not taking: Reported on 03/03/2016 09/30/15   Oneta Rack, NP  gabapentin (NEURONTIN) 300 MG capsule Take 1 capsule (300 mg total) by mouth 3 (three) times daily. Patient not taking: Reported on 01/30/2017 03/03/16   Georgina Quint, MD  HYDROcodone-acetaminophen (NORCO/VICODIN) 5-325 MG tablet Take 2 tablets by mouth every 4 (four) hours as needed. 01/30/17   Jacalyn Lefevre, MD  ibuprofen (ADVIL,MOTRIN) 800 MG tablet Take 1 tablet (800 mg total) by mouth every 8 (eight) hours as needed. Patient not taking: Reported on 03/03/2016 09/26/15   Charlestine Night, PA-C  metroNIDAZOLE (FLAGYL) 500 MG tablet Take 1 tablet (500 mg total) by mouth 2 (two) times daily. 01/30/17   Jacalyn Lefevre, MD  nicotine polacrilex (NICORETTE) 2 MG gum Take 1 each (2 mg total) by mouth as needed for smoking cessation. Patient not taking: Reported on 03/03/2016 09/30/15   Oneta Rack, NP  traZODone (DESYREL) 100 MG tablet Take 1 tablet (100 mg total) by mouth at bedtime as needed for sleep (agitation). Patient not taking: Reported on 03/03/2016 09/30/15   Oneta Rack, NP    Family History Family History  Problem Relation Age of Onset  . Cancer Mother        breast - both  . Arthritis Maternal Grandmother        rheumatoid  . Cancer Maternal Grandfather        brain  . Alzheimer's disease Paternal Grandmother   . Mental illness Neg Hx   . Drug abuse Neg Hx   . Suicidality Neg Hx     Social History Social History   Tobacco Use  . Smoking status: Current Every Day Smoker    Packs/day: 2.00    Years: 7.00    Pack years: 14.00    Types: Cigarettes  . Smokeless tobacco: Never Used  Substance Use Topics  . Alcohol use: Yes    Comment: a few a week  .  Drug use: Not on file    Comment: States no per pt     Allergies   Patient has no known allergies.   Review of Systems Review of Systems  Respiratory: Positive for cough.    Level 5 caveat altered mental status  Physical Exam Updated Vital Signs Ht 5\' 10"  (1.778 m)   Wt 72.6 kg (160 lb)   BMI 22.96 kg/m   Physical Exam  Constitutional:  Unkempt, with dirt-streaked face. Abrasions on lower extremities and bilateral elbows.  HENT:  Head: Normocephalic and atraumatic.  Mouth/Throat: Oropharynx is clear and moist.  Eyes: Pupils are equal, round, and reactive to light. Conjunctivae and EOM are normal.  Neck: Normal range of motion. Neck supple.  Cardiovascular: Regular rhythm, S1 normal and S2 normal.  No murmur heard. Bradycardia noted.  Pulmonary/Chest: Effort normal and breath sounds normal. She has no wheezes. She has no rales.  Abdominal: Soft. She exhibits no distension. There is no tenderness. There is no guarding.  Musculoskeletal: Normal range of motion. She exhibits no edema or deformity.  Lymphadenopathy:    She has no cervical adenopathy.  Neurological: She is alert.  Cranial nerves grossly intact. Patient moves extremities symmetrically and with good coordination.  Skin: Skin is warm and dry. No rash noted. No erythema.  Psychiatric: She has a normal mood and affect. Her behavior is normal. Judgment and thought content normal.  Nursing note and vitals reviewed.    ED Treatments / Results  Labs (all labs ordered are listed, but only abnormal results are displayed) Labs Reviewed - No data to display  EKG EKG Interpretation  Date/Time:  Friday August 06 2017 22:10:14 EDT Ventricular Rate:  60 PR Interval:    QRS Duration: 94 QT Interval:  448 QTC Calculation: 448 R Axis:   63 Text Interpretation:  Sinus or ectopic atrial rhythm Left atrial enlargement Low voltage, precordial leads Borderline Q waves in lateral leads Confirmed by Blane Ohara [phone])  on 08/06/2017 10:32:30 PM   Radiology No results found.  Procedures Procedures (including critical care time)  CRITICAL CARE Performed by: Elisha Ponder   Total critical care time: 35 minutes  Critical care time was exclusive of separately billable procedures and treating other patients.  Critical care was necessary to treat or prevent imminent or life-threatening deterioration.  Critical care was time spent personally by me on the following activities: development of treatment plan with patient and/or surrogate as well as nursing, discussions with consultants, evaluation of patient's response to treatment, examination of patient, obtaining history from patient or surrogate, ordering and performing treatments and interventions, ordering and review of laboratory studies, ordering and review of radiographic studies, pulse oximetry and  re-evaluation of patient's condition.   Medications Ordered in ED Medications  sodium chloride 0.9 % bolus 1,000 mL (0 mLs Intravenous Stopped 08/06/17 2322)    And  0.9 %  sodium chloride infusion (has no administration in time range)  naloxone HCl (NARCAN) 4 mg in dextrose 5 % 250 mL infusion (0.7 mg/hr Intravenous New Bag/Given 08/07/17 0100)  naloxone [facility](NARCAN) injection 0.4 mg (0.4 mg Intravenous Given 08/06/17 2224)  naloxone [facility](NARCAN) injection 1 mg (1 mg Intravenous Given 08/06/17 2322)  sodium chloride 0.9 % bolus 1,000 mL (1,000 mLs Intravenous New Bag/Given 08/07/17 0014)     Initial Impression / Assessment and Plan / ED Course  I have reviewed the triage vital signs and the nursing notes.  Pertinent labs & imaging results that were available during my care of the patient were reviewed by me and considered in my medical decision making (see chart for details).  Clinical Course as of Aug 07 236  Fri Aug 06, 2017  2247 Reassess patient after 0.4 mg of Narcan.  Pupils are now 3 mm, and equal.  Patient declines any further treatment, and states  my right is already on the way".  When asking patient what she took today, she states "nothing".  Patient is coughing, but repeat pulmonary exam without wheezing or rales.    [AM]  2319 Reassessed and more somnolent. Will administer 1 mg Narcan.   [AM]  2348 Spoke with ED pharmacist who recommended 0.7 mg narcan/hour.   [AM]  Sat Aug 07, 2017  0013 Patient ambulated in emergency department. Normal and symmetric gait. Requesting to leave immediately  CBG monitoring, ED [AM]  0041 Suspect stress demargination secondary to drug overdose.  WBC(!): 11.8 [AM]  0137 Spoke with Dr. Julian ReilGardner of [facility], who states that he cannot take patient on a Narcan drip due to hospital privileging.  Critical care consulted.   [AM]  0209 On narcan drip. No hypoxia or bradypnea.   [AM]  [phone] Patient to be observed by Dr. Judd Lienelo in the emergency department for clinical stability.  Will allow patient to metabolize on her own.   [AM]    Clinical Course User Index [AM] Elisha PonderMurray, Avanish [place]ullo B, PA-C    Patient nontoxic-appearing, but appearing unkempt.  Initially protecting airway, but had episodes of bradypnea, as well as hypoxia.  0.5 mg followed by 1 mg of Narcan administered.  No evidence of head trauma or other cause of somnolence.  Given the multiple doses, will initiate Narcan drip and admit.  Patient still refusing to offer information about her substance use today, or its intent.  2:38 AM Patient be observed in ED overnight for clinical stability, and once drugs of all metabolites, will be discharged.   Final Clinical Impressions(s) / ED Diagnoses   Final diagnoses:  Drug overdose, undetermined intent, initial encounter    ED Discharge Orders    None       Delia ChimesMurray, [place] B, PA-C 08/07/17 0239    Blane OharaZavitz, Joshua, MD 08/10/17 0345

## 2017-08-07 LAB — COMPREHENSIVE METABOLIC PANEL
ALT: 21 U/L (ref 0–44)
ANION GAP: 5 (ref 5–15)
AST: 35 U/L (ref 15–41)
Albumin: 3.2 g/dL — ABNORMAL LOW (ref 3.5–5.0)
Alkaline Phosphatase: 56 U/L (ref 38–126)
BUN: 8 mg/dL (ref 6–20)
CO2: 27 mmol/L (ref 22–32)
Calcium: 8.3 mg/dL — ABNORMAL LOW (ref 8.9–10.3)
Chloride: 110 mmol/L (ref 98–111)
Creatinine, Ser: 0.82 mg/dL (ref 0.44–1.00)
GFR calc non Af Amer: >60 mL/min (ref >60)
Glucose, Bld: 99 mg/dL (ref 70–99)
Potassium: 5 mmol/L (ref 3.5–5.1)
Sodium: 142 mmol/L (ref 135–145)
Total Bilirubin: 1.2 mg/dL (ref 0.3–1.2)
Total Protein: 5.9 g/dL — ABNORMAL LOW (ref 6.5–8.1)

## 2017-08-07 LAB — CBC WITH DIFFERENTIAL/PLATELET
Basophils Absolute: 0.1 10*3/uL (ref 0.0–0.1)
Basophils Relative: 1 %
EOS PCT: 1 %
Eosinophils Absolute: 0.1 10*3/uL (ref 0.0–0.7)
HEMATOCRIT: 42.3 % (ref 36.0–46.0)
Hemoglobin: 13.3 g/dL (ref 12.0–15.0)
Lymphocytes Relative: 62 %
Lymphs Abs: 7.4 10*3/uL — ABNORMAL HIGH (ref 0.7–4.0)
MCH: 29.7 pg (ref 26.0–34.0)
MCHC: 31.4 g/dL (ref 30.0–36.0)
MCV: 94.4 fL (ref 78.0–100.0)
MONOS PCT: 7 %
Monocytes Absolute: 0.8 10*3/uL (ref 0.1–1.0)
NEUTROS ABS: 3.4 10*3/uL (ref 1.7–7.7)
Neutrophils Relative %: 29 %
Platelets: 329 10*3/uL (ref 150–400)
RBC: 4.48 MIL/uL (ref 3.87–5.11)
RDW: 14.5 % (ref 11.5–15.5)
WBC: 11.8 10*3/uL — ABNORMAL HIGH (ref 4.0–10.5)

## 2017-08-07 LAB — ETHANOL: Alcohol, Ethyl (B): <10 mg/dL (ref <10)

## 2017-08-07 MED ORDER — NALOXONE HCL 4 MG/10ML IJ SOLN
0.7000 mg/h | INTRAVENOUS | Status: DC
  Administered 2017-08-07: 0.7 mg/h via INTRAVENOUS
  Filled 2017-08-07: qty 10

## 2017-08-07 NOTE — Discharge Instructions (Signed)
You were seen today for an overdose, likely on opioid medication.  We will versus today, but we are still concerned about your breathing.  If at any point anyone is worried about your breathing, or you feel that you are in an unsafe situation, please call 911 immediately.  Thank you for allowing us to participate in your care today.

## 2017-08-07 NOTE — ED Notes (Signed)
Declined W/C at D/C and was escorted to lobby by RN. 

## 2017-08-07 NOTE — ED Notes (Signed)
Dressing to IV changed.  Flushes well.  Continues to be somnolent.  Narcan drip started per order.  To continue to monitor.

## 2017-08-07 NOTE — ED Notes (Signed)
Up walking to the bathroom saying she's ready to go home.  Upon entering room patient sound asleep once again.  Reconnected to monitor and second liter of fluid started.  Waiting for narcan drip to come from pharmacy.

## 2017-08-07 NOTE — ED Triage Notes (Signed)
PER Pa  Do not give IV fluids at 13125ml/hr  Because Pt pulled out IV site.

## 2017-08-07 NOTE — ED Notes (Signed)
In room trying to wake up patient.  Wakes up then goes back to sleep.  States let me sleep another 12 minutes.  Dr Judd Lienelo aware.

## 2017-08-07 NOTE — ED Notes (Signed)
Iv found pulled out.  Per physician leaving out.  Noted to be sleeping soundly.  Will awaken when spoken to then falls back to sleep.

## 2017-11-05 ENCOUNTER — Emergency Department (HOSPITAL_COMMUNITY): Admission: EM | Admit: 2017-11-05 | Discharge: 2017-11-05 | Discharge status: Against Medical Advice | Payer: Self-pay

## 2017-11-05 NOTE — ED Notes (Signed)
Bed: WA26 Expected date:  Expected time:  Means of arrival:  Comments: 

## 2019-01-29 IMAGING — CT CT ABD-PELV W/ CM
2 of 4 series · 17 of 46 positions shown, 19 images · IV contrast (ISOVUE)
Comparison: None

CLINICAL DATA: Lower abdominal pain, recent abortion 01/23/2017,
elevated HCG

EXAM:
CT ABDOMEN AND PELVIS WITH CONTRAST
TECHNIQUE: Multidetector CT imaging of the abdomen and pelvis was performed
using the standard protocol following bolus administration of
intravenous contrast. Sagittal and coronal MPR images reconstructed
from axial data set.
CONTRAST:  100 mL [id number] IV

[Series 2: axial st · axial · 0.75mm/px · z∈[-450,-25]mm · 14 of 95 slices shown, 16 images]
[im 5/95  soft-tissue]
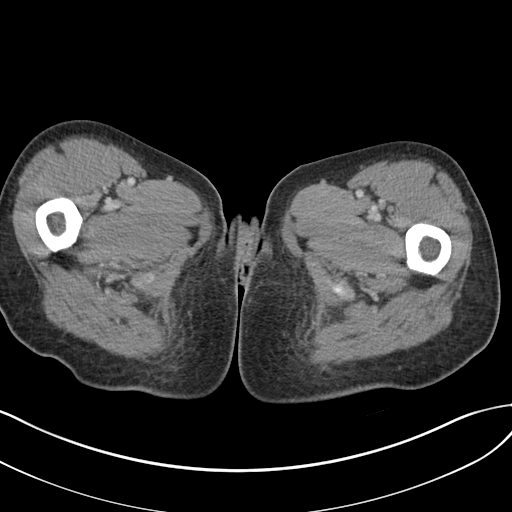
[im 5/95  bone]
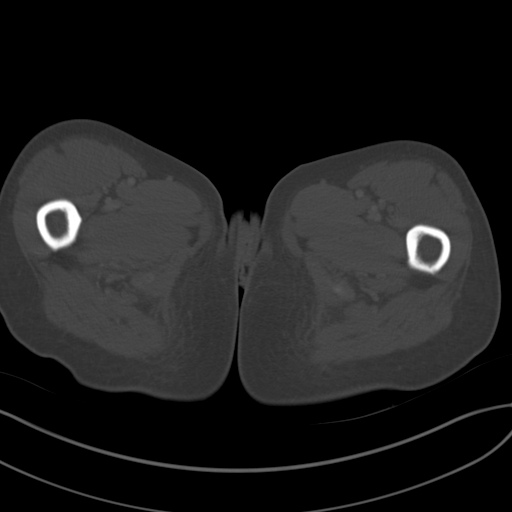
[im 15/95  soft-tissue]
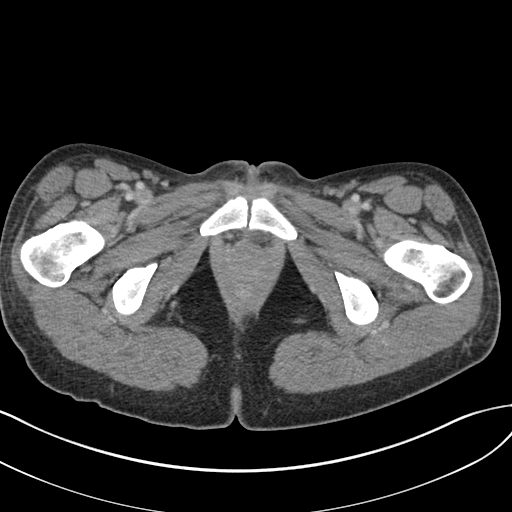
[im 19/95  soft-tissue]
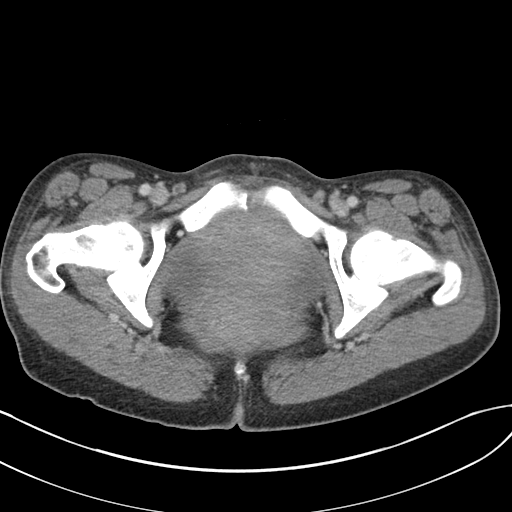
[im 24/95  soft-tissue]
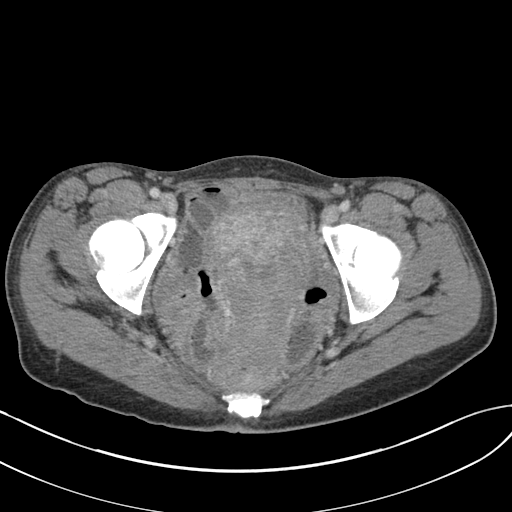
[im 33/95  soft-tissue]
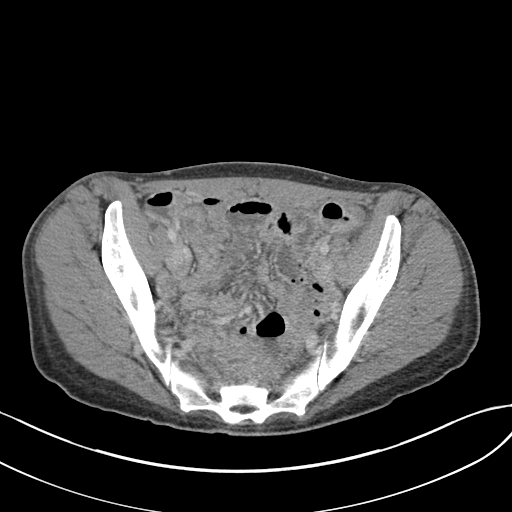
[im 38/95  soft-tissue]
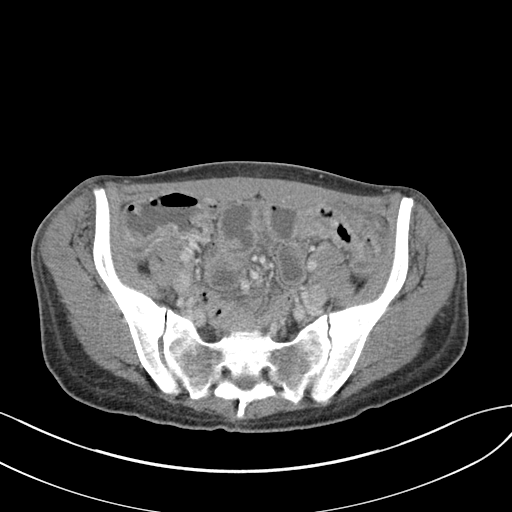
[im 43/95  soft-tissue]
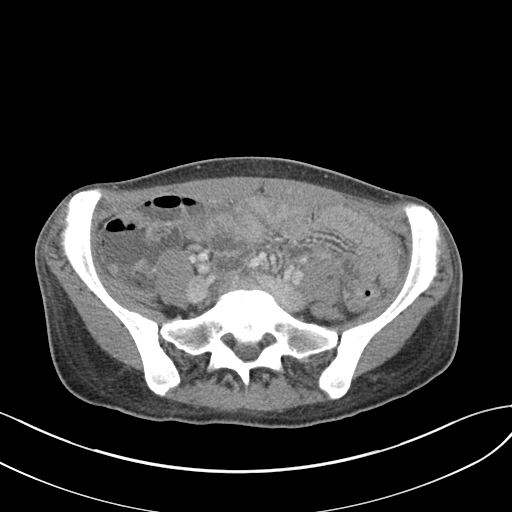
[im 52/95  soft-tissue]
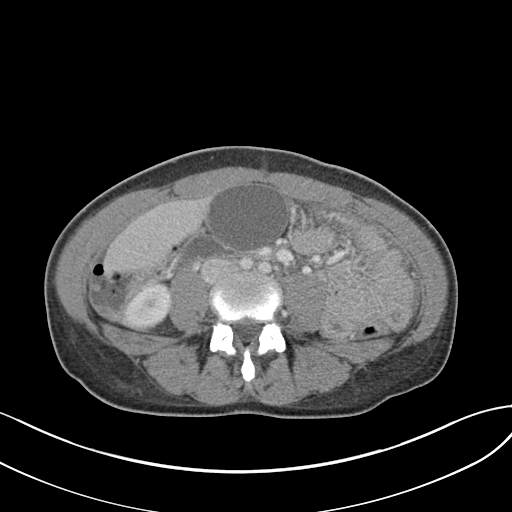
[im 57/95  soft-tissue]
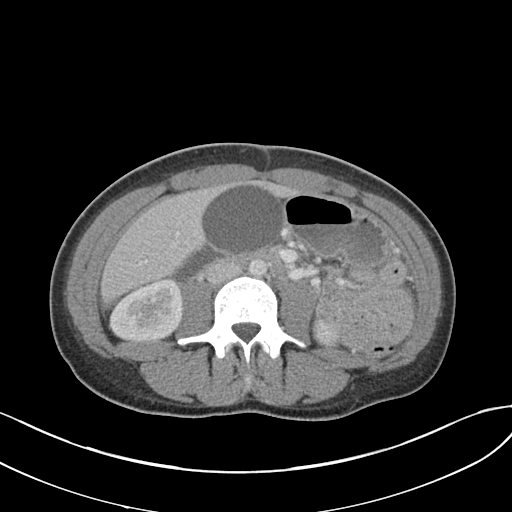
[im 57/95  bone]
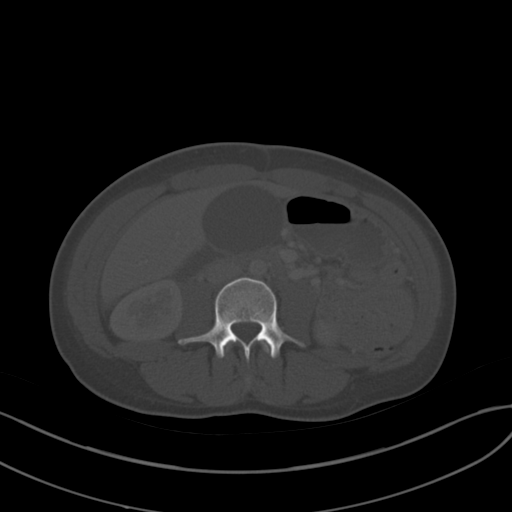
[im 62/95  soft-tissue]
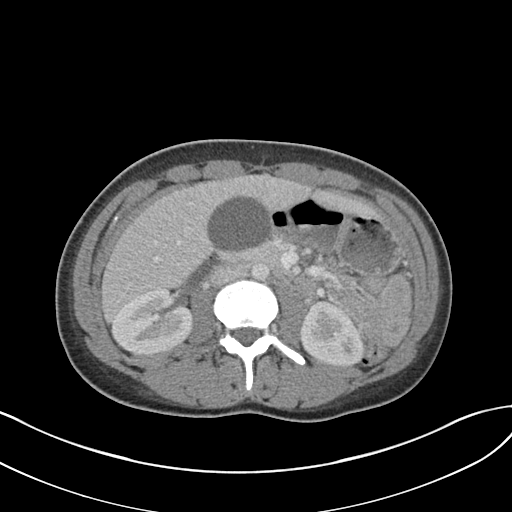
[im 71/95  soft-tissue]
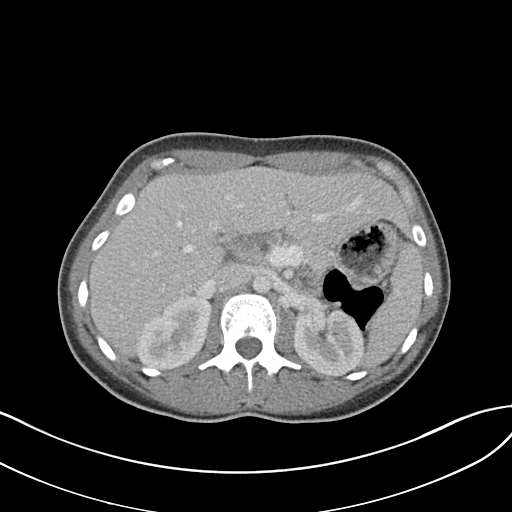
[im 76/95  soft-tissue]
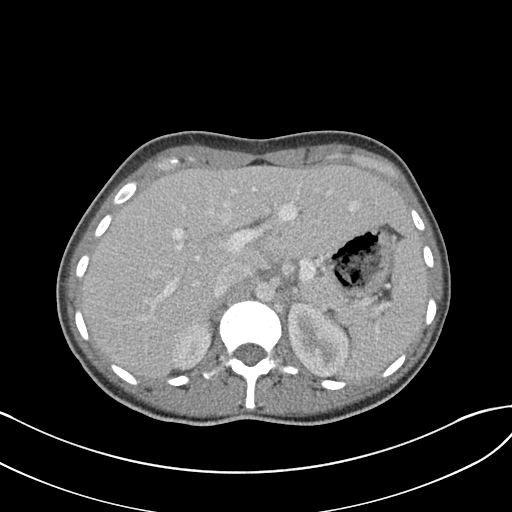
[im 80/95  soft-tissue]
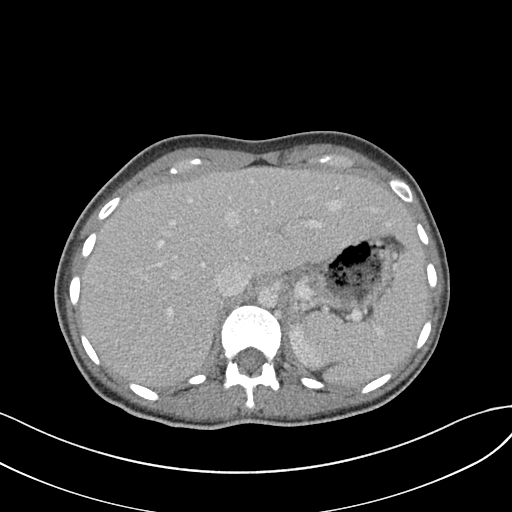
[im 90/95  soft-tissue]
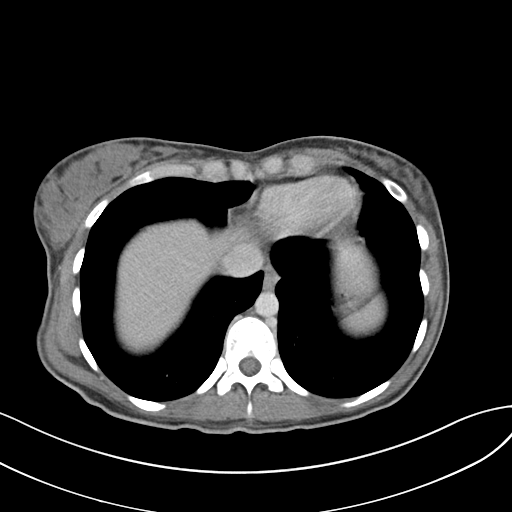

[Series 4: coronal st · coronal · 0.90mm/px · 3 of 84 slices shown]
[im 28/84  soft-tissue]
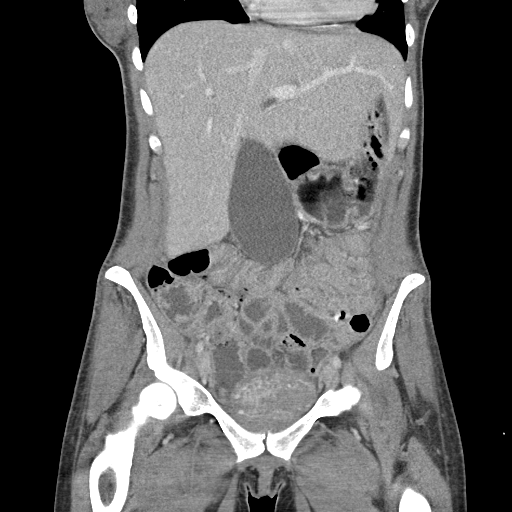
[im 37/84  soft-tissue]
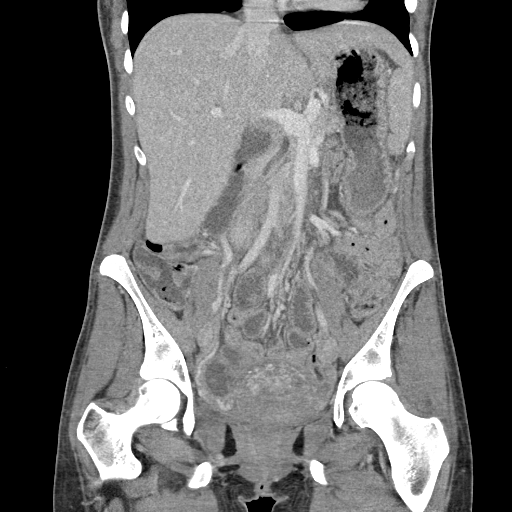
[im 47/84  soft-tissue]
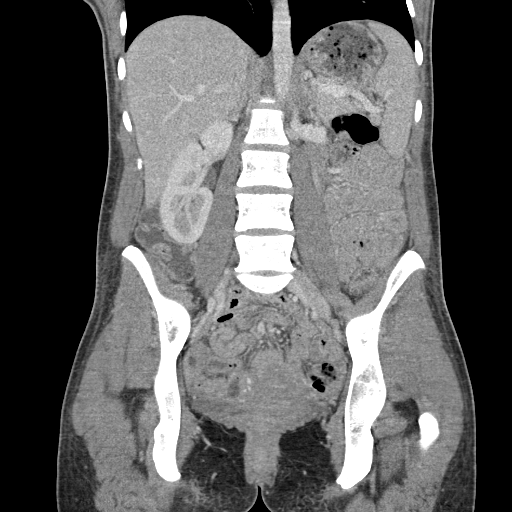

[17 of 46 positions shown; findings below may reference images not displayed]

FINDINGS: Lower chest: Lung bases clear

Hepatobiliary: Significantly distended gallbladder 11.9 x 5.5 x
cm without definite calculi or wall thickening liver unremarkable.
No biliary dilatation.

Pancreas: Normal appearance

Spleen: Normal appearance

Adrenals/Urinary Tract: Adrenal glands and kidneys normal
appearance. No hydronephrosis or definite urinary tract
calcification. Bladder decompressed, unable to assess.

Stomach/Bowel: Appendix obscured. Stomach and bowel loops grossly
unremarkable for exam lacking GI contrast. Numerous bowel loops in
pelvis adnexa and into cul de sac.

Vascular/Lymphatic: Unremarkable

Reproductive: Prominent uterine size compatible with recent
pregnancy. No definite uterine fluid collection. Asymmetric
enhancement at anterior uterine wall nonspecific. Ovaries obscured
by bowel loops in pelvis.

Other: No free air or free fluid.  No hernia.

Musculoskeletal: Normal appearance
IMPRESSION: No definite acute intrapelvic process is identified though
assessment is suboptimal due to lack of GI opacification and
adequate distention.

Significantly distended gallbladder without definite gallstones or
wall thickening.

## 2020-08-02 ENCOUNTER — Emergency Department (HOSPITAL_COMMUNITY)
Admission: EM | Admit: 2020-08-02 | Discharge: 2020-08-04 | Disposition: A | Discharge status: Home / Self Care | Payer: Self-pay | Attending: Emergency Medicine | Admitting: Emergency Medicine

## 2020-08-02 ENCOUNTER — Emergency Department (HOSPITAL_COMMUNITY): Payer: Self-pay

## 2020-08-02 ENCOUNTER — Encounter (HOSPITAL_COMMUNITY): Payer: Self-pay

## 2020-08-02 DIAGNOSIS — F1490 Cocaine use, unspecified, uncomplicated: Secondary | ICD-10-CM | POA: Insufficient documentation

## 2020-08-02 DIAGNOSIS — Z20822 Contact with and (suspected) exposure to covid-19: Secondary | ICD-10-CM | POA: Insufficient documentation

## 2020-08-02 DIAGNOSIS — F1920 Other psychoactive substance dependence, uncomplicated: Secondary | ICD-10-CM

## 2020-08-02 DIAGNOSIS — F17210 Nicotine dependence, cigarettes, uncomplicated: Secondary | ICD-10-CM | POA: Insufficient documentation

## 2020-08-02 DIAGNOSIS — Y900 Blood alcohol level of less than 20 mg/100 ml: Secondary | ICD-10-CM | POA: Insufficient documentation

## 2020-08-02 DIAGNOSIS — R4182 Altered mental status, unspecified: Secondary | ICD-10-CM | POA: Insufficient documentation

## 2020-08-02 DIAGNOSIS — F1120 Opioid dependence, uncomplicated: Secondary | ICD-10-CM | POA: Insufficient documentation

## 2020-08-02 DIAGNOSIS — R45851 Suicidal ideations: Secondary | ICD-10-CM | POA: Insufficient documentation

## 2020-08-02 DIAGNOSIS — F3113 Bipolar disorder, current episode manic without psychotic features, severe: Secondary | ICD-10-CM | POA: Insufficient documentation

## 2020-08-02 DIAGNOSIS — T1491XA Suicide attempt, initial encounter: Secondary | ICD-10-CM

## 2020-08-02 LAB — CBC WITH DIFFERENTIAL/PLATELET
Abs Immature Granulocytes: 0.02 10*3/uL (ref 0.00–0.07)
Basophils Absolute: 0 10*3/uL (ref 0.0–0.1)
Basophils Relative: 0 %
Eosinophils Absolute: 0.1 10*3/uL (ref 0.0–0.5)
Eosinophils Relative: 1 %
HCT: 40.1 % (ref 36.0–46.0)
Hemoglobin: 13.3 g/dL (ref 12.0–15.0)
Immature Granulocytes: 0 %
Lymphocytes Relative: 18 %
Lymphs Abs: 1.1 10*3/uL (ref 0.7–4.0)
MCH: 30 pg (ref 26.0–34.0)
MCHC: 33.2 g/dL (ref 30.0–36.0)
MCV: 90.3 fL (ref 80.0–100.0)
Monocytes Absolute: 0.6 10*3/uL (ref 0.1–1.0)
Monocytes Relative: 10 %
Neutro Abs: 4.6 10*3/uL (ref 1.7–7.7)
Neutrophils Relative %: 71 %
Platelets: 245 10*3/uL (ref 150–400)
RBC: 4.44 MIL/uL (ref 3.87–5.11)
RDW: 13.9 % (ref 11.5–15.5)
WBC: 6.5 10*3/uL (ref 4.0–10.5)
nRBC: 0 % (ref 0.0–0.2)

## 2020-08-02 LAB — ETHANOL: Alcohol, Ethyl (B): <10 mg/dL (ref <10)

## 2020-08-02 LAB — COMPREHENSIVE METABOLIC PANEL
ALT: 18 U/L (ref 0–44)
AST: 23 U/L (ref 15–41)
Albumin: 4.4 g/dL (ref 3.5–5.0)
Alkaline Phosphatase: 40 U/L (ref 38–126)
Anion gap: 8 (ref 5–15)
BUN: 15 mg/dL (ref 6–20)
CO2: 23 mmol/L (ref 22–32)
Calcium: 8.8 mg/dL — ABNORMAL LOW (ref 8.9–10.3)
Chloride: 106 mmol/L (ref 98–111)
Creatinine, Ser: 0.87 mg/dL (ref 0.44–1.00)
GFR, Estimated: >60 mL/min (ref >60)
Glucose, Bld: 102 mg/dL — ABNORMAL HIGH (ref 70–99)
Potassium: 3.5 mmol/L (ref 3.5–5.1)
Sodium: 137 mmol/L (ref 135–145)
Total Bilirubin: 1 mg/dL (ref 0.3–1.2)
Total Protein: 7.4 g/dL (ref 6.5–8.1)

## 2020-08-02 LAB — ACETAMINOPHEN LEVEL: Acetaminophen (Tylenol), Serum: <10 ug/mL — ABNORMAL LOW (ref 10–30)

## 2020-08-02 LAB — RAPID URINE DRUG SCREEN, HOSP PERFORMED
Amphetamines: POSITIVE — AB
Barbiturates: NOT DETECTED
Benzodiazepines: POSITIVE — AB
Cocaine: POSITIVE — AB
Opiates: POSITIVE — AB
Tetrahydrocannabinol: NOT DETECTED

## 2020-08-02 LAB — RESP PANEL BY RT-PCR (FLU A&B, COVID) ARPGX2
Influenza A by PCR: NEGATIVE
Influenza B by PCR: NEGATIVE
SARS Coronavirus 2 by RT PCR: NEGATIVE

## 2020-08-02 LAB — I-STAT BETA HCG BLOOD, ED (MC, WL, AP ONLY): I-stat hCG, quantitative: <5 m[IU]/mL (ref <5)

## 2020-08-02 MED ORDER — SODIUM CHLORIDE 0.9 % IV BOLUS
500.0000 mL | Freq: Once | INTRAVENOUS | Status: AC
  Administered 2020-08-02: 500 mL via INTRAVENOUS

## 2020-08-02 NOTE — ED Notes (Signed)
Friend took out commitment papers on her-states she tried to jump off her balcony-[employer] witnessed episode

## 2020-08-02 NOTE — BH Assessment (Signed)
This Clinical research associate attempted to assess patient at 46 and was informed by RN that patient had been medicated earlier for agitation. Patient is observed to be sleeping and was not able to be aroused. Patient will be seen and assessed once she is awake/alert and can participate in the assessment.

## 2020-08-02 NOTE — ED Notes (Signed)
[name] [phone]

## 2020-08-02 NOTE — ED Notes (Signed)
Patient unable to answer triage questions at this time.  

## 2020-08-02 NOTE — BH Assessment (Signed)
Comprehensive Clinical Assessment (CCA) Note  08/02/2020 [name] [id number]  DISPOSITION: Gave clinical report to Roselyn Bering, NP who determined Pt meets criteria for inpatient psychiatric treatment. Binnie Rail, [facility], is reviewing Pt for possible admission. Notified Dr. Coralee Pesa and Quentin Cornwall, RN of recommendation.  The patient demonstrates the following risk factors for suicide: Chronic risk factors for suicide include: psychiatric disorder of bipolar disorder, substance use disorder, and previous suicide attempts by overdose . Acute risk factors for suicide include: social withdrawal/isolation. Protective factors for this patient include: positive social support. Considering these factors, the overall suicide risk at this point appears to be high. Patient is not appropriate for outpatient follow up.  Pt is a 30 year old single female who presents unaccompanied to [facility] ED after being petitioned by her friend, Noland Fordyce [phone]. Affidavit and petition states: "Respondent has been previously diagnosed with bipolar disorder and borderline personality. She does not have medication for her mental health issues. Today [employer] were contacted by her friend and upon arriving [place] [employer] IVC unit officer had to grab her as she attempted to jump over the railing over her balcony. Friend of respondent states that respondent has stated that she wants to kill herself. Officer transporting respondent to the hospital for evaluation."  Pt's medical record indicates she has a history of bipolar disorder, manic episodes, and substance use. Pt is minimally cooperative during assessment. She was given medication earlier and remains somewhat drowsy. She says she was brought to the ED "Because I was outside acting crazy." Pt would not elaborate any further despite several attempts at inquiry. Pt describes her mood recently as "fine... tired." Pt denies all  depressive symptoms. She denies problems with sleep or appetite. She denies current suicidal ideation. When asked if she remembered jumping from a balcony today, Pt says she does but offers no explanation. Pt's acknowledges a history of previous suicide attempts but offers no details. Pt's medical record indicates Pt has a history of overdosing. She denies current homicidal ideation or history of aggression. Pt reports she snorts 0.5-1 gram of heroin daily. She denies alcohol or other substance use.   Pt does not identify any stressors. She says she lives alone, however IVC paperwork lists her as homeless. She states she has supportive friends but does not elaborate. She denies history of abuse. She denies legal problems. She denies access to firearms.  Pt says she has no mental health providers. She says she is not taking any psychiatric medication. Pt acknowledges she has been inpatient at [facility] in the past, most recently in September 2017.  Pt gave permission to contact Noland Fordyce at [phone]. Mr Revonda Standard said he was in bed, would not answer questions at this hour, and asked to be called back in the morning.  Pt is dressed in hospital scrubs, drowsy and oriented x4. Pt speaks in a clear tone, at moderate volume and normal pace. Motor behavior appears normal. Eye contact is minimal. Pt's mood is irritable and affect is constricted. Thought process is coherent and relevant. There is no indication Pt is currently responding to internal stimuli or experiencing delusional thought content. Pt says she does not want any mental health or substance use treatment and states "I am leaving."   Chief Complaint:  Chief Complaint  Patient presents with   Drug Problem   Visit Diagnosis:  F31.13 Bipolar I disorder, Current or most recent episode manic, Severe F11.20 Opioid use disorder,  Severe   CCA Screening, Triage and Referral (STR)  Patient Reported Information How did you hear about us?  Other (Comment) Mudlogger(Law enforcement)  Referral name: No data recorded Referral phone number: No data recorded  Whom do you see for routine medical problems? No data recorded Practice/Facility Name: No data recorded Practice/Facility Phone Number: No data recorded Name of Contact: No data recorded Contact Number: No data recorded Contact Fax Number: No data recorded Prescriber Name: No data recorded Prescriber Address (if known): No data recorded  What Is the Reason for Your Visit/Call Today? Pt states she was "acting crazy."  How Long Has This Been Causing You Problems? > than 6 months  What Do You Feel Would Help You the Most Today? -- (Pt does not want mental health or substance use treatment.)   Have You Recently Been in Any Inpatient Treatment (Hospital/Detox/Crisis Center/28-Day Program)? No data recorded Name/Location of Program/Hospital:No data recorded How Long Were You There? No data recorded When Were You Discharged? No data recorded  Have You Ever Received Services From [facility] Before? No data recorded Who Do You See at [facility]? No data recorded  Have You Recently Had Any Thoughts About Hurting Yourself? Yes  Are You Planning to Commit Suicide/Harm Yourself At This time? No   Have you Recently Had Thoughts About Hurting Someone Karolee Ohslse? No  Explanation: No data recorded  Have You Used Any Alcohol or Drugs in the Past 24 Hours? Yes  How Long Ago Did You Use Drugs or Alcohol? No data recorded What Did You Use and How Much? Heroin   Do You Currently Have a Therapist/Psychiatrist? No  Name of Therapist/Psychiatrist: No data recorded  Have You Been Recently Discharged From Any Office Practice or Programs? No  Explanation of Discharge From Practice/Program: No data recorded    CCA Screening Triage Referral Assessment Type of Contact: No data recorded Is this Initial or Reassessment? No data recorded Date Telepsych consult ordered in [facility]:  No data  recorded Time Telepsych consult ordered in [facility]:  No data recorded  Patient Reported Information Reviewed? No data recorded Patient Left Without Being Seen? No data recorded Reason for Not Completing Assessment: No data recorded  Collateral Involvement: None available   Does Patient Have a Court Appointed Legal Guardian? No data recorded Name and Contact of Legal Guardian: No data recorded If Minor and Not Living with Parent(s), Who has Custody? NA  Is CPS involved or ever been involved? Never  Is APS involved or ever been involved? Never   Patient Determined To Be At Risk for Harm To Self or Others Based on Review of Patient Reported Information or Presenting Complaint? Yes, for Self-Harm  Method: No data recorded Availability of Means: No data recorded Intent: No data recorded Notification Required: No data recorded Additional Information for Danger to Others Potential: No data recorded Additional Comments for Danger to Others Potential: No data recorded Are There Guns or Other Weapons in Your Home? No data recorded Types of Guns/Weapons: No data recorded Are These Weapons Safely Secured?                            No data recorded Who Could Verify You Are Able To Have These Secured: No data recorded Do You Have any Outstanding Charges, Pending Court Dates, Parole/Probation? No data recorded Contacted To Inform of Risk of Harm To Self or Others: Unable to Contact:   Location of Assessment: [facility] ED  Does Patient Present under Involuntary Commitment? Yes  IVC Papers Initial File Date: 08/02/20   [place] of Residence: [place]   Patient Currently Receiving the Following Services: Not Receiving Services   Determination of Need: Urgent (48 hours)   Options For Referral: Inpatient Hospitalization     CCA Biopsychosocial Intake/Chief Complaint:  No data recorded Current Symptoms/Problems: No data recorded  Patient Reported Schizophrenia/Schizoaffective Diagnosis  in Past: No   Strengths: Unable to assess - Pt uncooperative  Preferences: No data recorded Abilities: No data recorded  Type of Services Patient Feels are Needed: No data recorded  Initial Clinical Notes/Concerns: No data recorded  Mental Health Symptoms Depression:   Change in energy/activity; Irritability   Duration of Depressive symptoms:  Greater than two weeks   Mania:   Change in energy/activity; Increased Energy; Irritability; Recklessness   Anxiety:    Irritability   Psychosis:   None   Duration of Psychotic symptoms: No data recorded  Trauma:   None   Obsessions:   None   Compulsions:   None   Inattention:   N/A   Hyperactivity/Impulsivity:   N/A   Oppositional/Defiant Behaviors:   N/A   Emotional Irregularity:   None   Other Mood/Personality Symptoms:   NA    Mental Status Exam Appearance and self-care  Stature:   Average   Weight:   Average weight   Clothing:   -- (Scrubs)   Grooming:   Normal   Cosmetic use:   Age appropriate   Posture/gait:   Normal   Motor activity:   Not Remarkable   Sensorium  Attention:   Inattentive   Concentration:   Variable   Orientation:   Object; Person; Place; Situation   Recall/memory:   Defective in Immediate   Affect and Mood  Affect:   Constricted   Mood:   Irritable   Relating  Eye contact:   Fleeting   Facial expression:   Tense   Attitude toward examiner:   Uninterested   Thought and Language  Speech flow:  Paucity   Thought content:   Appropriate to Mood and Circumstances   Preoccupation:   None   Hallucinations:   None   Organization:  No data recorded  [employer] of Knowledge:   Average   Intelligence:   Average   Abstraction:   Normal   Judgement:   Impaired   Reality Testing:   Adequate   Insight:   Lacking   Decision Making:   Impulsive   Social Functioning  Social Maturity:   Impulsive   Social  Judgement:   Heedless   Stress  Stressors:   Other (Comment) (Unable to assess - Pt uncooperative)   Coping Ability:   Exhausted   Skill Deficits:   None   Supports:   Family     Religion: Religion/Spirituality Are You A Religious Person?:  (Unable to assess - Pt uncooperative)  Leisure/Recreation: Leisure / Recreation Do You Have Hobbies?:  (Unable to assess - Pt uncooperative)  Exercise/Diet: Exercise/Diet Do You Exercise?:  (Unable to assess - Pt uncooperative) Have You Gained or Lost A Significant Amount of Weight in the Past Six Months?:  (Unable to assess - Pt uncooperative) Do You Follow a Special Diet?:  (Unable to assess - Pt uncooperative) Do You Have Any Trouble Sleeping?: No   CCA Employment/Education Employment/Work Situation: Employment / Work Situation Employment Situation:  (Unable to assess - Pt uncooperative) Patient's Job has Been Impacted by Current Illness:  (  Unable to assess - Pt uncooperative) Has Patient ever Been in the Military?: No  Education: Education Is Patient Currently Attending School?: No Did You Attend College?:  (Unable to assess - Pt uncooperative) Did You Have An Individualized Education Program (IIEP):  (Unable to assess - Pt uncooperative) Did You Have Any Difficulty At School?:  (Unable to assess - Pt uncooperative) Patient's Education Has Been Impacted by Current Illness:  (Unable to assess - Pt uncooperative)   CCA Family/Childhood History Family and Relationship History: Family history Marital status: Single Does patient have children?: No  Childhood History:  Childhood History By whom was/is the patient raised?: Both parents Did patient suffer any verbal/emotional/physical/sexual abuse as a child?: No Did patient suffer from severe childhood neglect?: No Has patient ever been sexually abused/assaulted/raped as an adolescent or adult?: No Was the patient ever a victim of a crime or a disaster?: No Witnessed  domestic violence?: No Has patient been affected by domestic violence as an adult?: No  Child/Adolescent Assessment:     CCA Substance Use Alcohol/Drug Use: Alcohol / Drug Use Pain Medications: see MAR Prescriptions: see MAR History of alcohol / drug use?: Yes Longest period of sobriety (when/how long): unknown Withdrawal Symptoms: Irritability, Cramps, Sweats Substance #1 Name of Substance 1: Heroin 1 - Age of First Use: Unknown 1 - Amount (size/oz): 0.5-1 gram 1 - Frequency: Daily 1 - Duration: Ongoing 1 - Last Use / Amount: 08/01/2020 1 - Method of Aquiring: unknown 1- Route of Use: snorting                       ASAM's:  Six Dimensions of Multidimensional Assessment  Dimension 1:  Acute Intoxication and/or Withdrawal Potential:   Dimension 1:  Description of individual's past and current experiences of substance use and withdrawal: Pt using heroin daily and reports she experiences withdrawal  Dimension 2:  Biomedical Conditions and Complications:   Dimension 2:  Description of patient's biomedical conditions and  complications: None  Dimension 3:  Emotional, Behavioral, or Cognitive Conditions and Complications:  Dimension 3:  Description of emotional, behavioral, or cognitive conditions and complications: Pt has diagnosis of bipolar disorder  Dimension 4:  Readiness to Change:  Dimension 4:  Description of Readiness to Change criteria: Pt does not want substance use treatment  Dimension 5:  Relapse, Continued use, or Continued Problem Potential:  Dimension 5:  Relapse, continued use, or continued problem potential critiera description: Pt does not want to stop using heroin  Dimension 6:  Recovery/Living Environment:  Dimension 6:  Recovery/Iiving environment criteria description: Pt lives alone  ASAM Severity Score: ASAM's Severity Rating Score: 11  ASAM Recommended Level of Treatment: ASAM Recommended Level of Treatment: Level III Residential Treatment    Substance use Disorder (SUD) Substance Use Disorder (SUD)  Checklist Symptoms of Substance Use: Continued use despite having a persistent/recurrent physical/psychological problem caused/exacerbated by use, Continued use despite persistent or recurrent social, interpersonal problems, caused or exacerbated by use, Evidence of withdrawal (Comment), Recurrent use that results in a failure to fulfill major role obligations (work, school, home), Presence of craving or strong urge to use  Recommendations for Services/Supports/Treatments: Recommendations for Services/Supports/Treatments Recommendations For Services/Supports/Treatments: Inpatient Hospitalization  DSM5 Diagnoses: Patient Active Problem List   Diagnosis Date Noted   Bipolar disorder, current episode manic w/o psychotic features, severe (HCC) 09/25/2015   Cocaine use disorder, mild, abuse (HCC) 09/25/2015   Cannabis use disorder, severe, dependence (HCC) 09/25/2015   Tobacco use disorder  09/25/2015   Opioid use disorder, moderate, dependence (HCC) 09/25/2015   Attention deficit disorder 05/01/2014   Chronic back pain 03/05/2013   BMI 27.0-27.9,adult 03/05/2013    Patient Centered Plan: Patient is on the following Treatment Plan(s):  Anxiety, Depression, and Substance Abuse   Referrals to Alternative Service(s): Referred to Alternative Service(s):   Place:   Date:   Time:    Referred to Alternative Service(s):   Place:   Date:   Time:    Referred to Alternative Service(s):   Place:   Date:   Time:    Referred to Alternative Service(s):   Place:   Date:   Time:     Pamalee Leyden, [facility]

## 2020-08-02 NOTE — ED Triage Notes (Signed)
Pt BIB EMS and [employer]. Pt friend called because pt has been using crack and heroin for the last few days. Per pts friend pt threaten sucide but denied it to EMS/[employer]. Per EMS pt was acting erratic, pt received 5 haldol and 5 versed. Pts friend is going to take out IVC papers today.

## 2020-08-02 NOTE — ED Notes (Signed)
Pt refusing CT scan at this time. Pt taken back to [facility]

## 2020-08-02 NOTE — ED Provider Notes (Signed)
[facility]-EMERGENCY DEPT Provider Note   CSN: [id number] Arrival date & time: 08/02/20  1102     History Chief Complaint  Patient presents with   Drug Problem    [name] is a 30 y.o. female.  Patient has been using cocaine and heroin for a number days.  She also has been talking about killing herself.  Her friend took out commitment papers on her  The history is provided by the patient and medical records. No language interpreter was used.  Drug Problem This is a recurrent problem. The current episode started more than 2 days ago. The problem occurs constantly. The problem has not changed since onset.Pertinent negatives include no chest pain, no abdominal pain and no headaches. Nothing aggravates the symptoms. Nothing relieves the symptoms. She has tried nothing for the symptoms.      Past Medical History:  Diagnosis Date   Anxiety    Atrial septal defect    corrected 1999   Bipolar disorder (HCC)    Depression    Heart murmur    Mental health problem    Self-mutilation    Substance abuse [facility])     Patient Active Problem List   Diagnosis Date Noted   Bipolar disorder, current episode manic w/o psychotic features, severe (HCC) 09/25/2015   Cocaine use disorder, mild, abuse (HCC) 09/25/2015   Cannabis use disorder, severe, dependence (HCC) 09/25/2015   Tobacco use disorder 09/25/2015   Opioid use disorder, moderate, dependence (HCC) 09/25/2015   Attention deficit disorder 05/01/2014   Chronic back pain 03/05/2013   BMI 27.0-27.9,adult 03/05/2013    Past Surgical History:  Procedure Laterality Date   ASD REPAIR       OB History   No obstetric history on file.     Family History  Problem Relation Age of Onset   Cancer Mother        breast - both   Arthritis Maternal Grandmother        rheumatoid   Cancer Maternal Grandfather        brain   Alzheimer's disease Paternal Grandmother    Mental illness Neg Hx    Drug abuse Neg  Hx    Suicidality Neg Hx     Social History   Tobacco Use   Smoking status: Every Day    Packs/day: 2.00    Years: 7.00    Pack years: 14.00    Types: Cigarettes   Smokeless tobacco: Never  Substance Use Topics   Alcohol use: Yes    Comment: a few a week    Home Medications Prior to Admission medications   Medication Sig Start Date End Date Taking? Authorizing Provider  ARIPiprazole (ABILIFY) 15 MG tablet Take 1 tablet (15 mg total) by mouth at bedtime. Patient not taking: Reported on 03/03/2016 09/30/15   Oneta Rack, NP  ARIPiprazole ER 400 MG SUSR Inject 400 mg into the muscle every 28 (twenty-eight) days. To be give by PCP (Primary Care Provider) Patient not taking: Reported on 03/03/2016 10/26/15   Oneta Rack, NP  diphenhydramine-acetaminophen (TYLENOL PM) 25-500 MG TABS tablet Take 1 tablet by mouth at bedtime as needed (sleep).    [provider]  divalproex (DEPAKOTE) 500 MG DR tablet Take 1 tablet (500 mg total) by mouth every 12 (twelve) hours. Patient not taking: Reported on 03/03/2016 09/30/15   Oneta Rack, NP  gabapentin (NEURONTIN) 300 MG capsule Take 1 capsule (300 mg total) by mouth 3 (three)  times daily. Patient not taking: Reported on 01/30/2017 03/03/16   Georgina Quint, MD  HYDROcodone-acetaminophen (NORCO/VICODIN) 5-325 MG tablet Take 2 tablets by mouth every 4 (four) hours as needed. 01/30/17   Jacalyn Lefevre, MD  ibuprofen (ADVIL,MOTRIN) 800 MG tablet Take 1 tablet (800 mg total) by mouth every 8 (eight) hours as needed. Patient not taking: Reported on 03/03/2016 09/26/15   Charlestine Night, PA-C  metroNIDAZOLE (FLAGYL) 500 MG tablet Take 1 tablet (500 mg total) by mouth 2 (two) times daily. 01/30/17   Jacalyn Lefevre, MD  nicotine polacrilex (NICORETTE) 2 MG gum Take 1 each (2 mg total) by mouth as needed for smoking cessation. Patient not taking: Reported on 03/03/2016 09/30/15   Oneta Rack, NP  traZODone (DESYREL) 100 MG tablet Take 1 tablet  (100 mg total) by mouth at bedtime as needed for sleep (agitation). Patient not taking: Reported on 03/03/2016 09/30/15   Oneta Rack, NP    Allergies    Patient has no known allergies.  Review of Systems   Review of Systems  Constitutional:  Negative for appetite change and fatigue.  HENT:  Negative for congestion, ear discharge and sinus pressure.   Eyes:  Negative for discharge.  Respiratory:  Negative for cough.   Cardiovascular:  Negative for chest pain.  Gastrointestinal:  Negative for abdominal pain and diarrhea.  Genitourinary:  Negative for frequency and hematuria.  Musculoskeletal:  Negative for back pain.  Skin:  Negative for rash.  Neurological:  Negative for seizures and headaches.  Psychiatric/Behavioral:  Positive for agitation. Negative for hallucinations.    Physical Exam Updated Vital Signs BP 129/86 (BP Location: Right Arm)   Pulse 87   Temp 98.9 F (37.2 C) (Oral)   Resp 18   Ht 5\' 6"  (1.676 m)   Wt 65.8 kg   SpO2 100%   BMI 23.40 kg/m   Physical Exam Vitals and nursing note reviewed.  Constitutional:      Appearance: She is well-developed.     Comments: Lethargic  HENT:     Head: Normocephalic.     Nose: Nose normal.  Eyes:     General: No scleral icterus.    Conjunctiva/sclera: Conjunctivae normal.  Neck:     Thyroid: No thyromegaly.  Cardiovascular:     Rate and Rhythm: Normal rate and regular rhythm.     Heart sounds: No murmur heard.   No friction rub. No gallop.  Pulmonary:     Breath sounds: No stridor. No wheezing or rales.  Chest:     Chest wall: No tenderness.  Abdominal:     General: There is no distension.     Tenderness: There is no abdominal tenderness. There is no rebound.  Musculoskeletal:        General: Normal range of motion.     Cervical back: Neck supple.  Lymphadenopathy:     Cervical: No cervical adenopathy.  Skin:    Findings: No erythema or rash.  Neurological:     Mental Status: She is oriented to person,  place, and time.     Motor: No abnormal muscle tone.     Coordination: Coordination normal.  Psychiatric:     Comments: Patient has been depressed and using drugs.  She states is not suicidal but her friend said that she did admit to want to kill her self    ED Results / Procedures / Treatments   Labs (all labs ordered are listed, but only abnormal results are displayed) Labs  Reviewed  COMPREHENSIVE METABOLIC PANEL - Abnormal; Notable for the following components:      Result Value   Glucose, Bld 102 (*)    Calcium 8.8 (*)    All other components within normal limits  ACETAMINOPHEN LEVEL - Abnormal; Notable for the following components:   Acetaminophen (Tylenol), Serum <10 (*)    All other components within normal limits  CBC WITH DIFFERENTIAL/PLATELET  ETHANOL  RAPID URINE DRUG SCREEN, HOSP PERFORMED  I-STAT BETA HCG BLOOD, ED ([facility], [facility], [facility] ONLY)    EKG None  Radiology CT Head Wo Contrast  Result Date: 08/02/2020 CLINICAL DATA:  Mental status change.  Unknown cause. EXAM: CT HEAD WITHOUT CONTRAST TECHNIQUE: Contiguous axial images were obtained from the base of the skull through the vertex without intravenous contrast. COMPARISON:  CT head 03/24/2011 FINDINGS: Brain: No evidence of large-territorial acute infarction. No parenchymal hemorrhage. No mass lesion. No extra-axial collection. No mass effect or midline shift. No hydrocephalus. Basilar cisterns are patent. Vascular: No hyperdense vessel. Skull: No acute fracture or focal lesion. Sinuses/Orbits: Paranasal sinuses and mastoid air cells are clear. The orbits are unremarkable. Other: None. IMPRESSION: No acute intracranial abnormality. Electronically Signed   By: Tish Frederickson M.D.   On: 08/02/2020 15:25    Procedures Procedures   Medications Ordered in ED Medications  sodium chloride 0.9 % bolus 500 mL (0 mLs Intravenous Stopped 08/02/20 1427)    ED Course  I have reviewed the triage vital signs and the nursing  notes.  Pertinent labs & imaging results that were available during my care of the patient were reviewed by me and considered in my medical decision making (see chart for details). CRITICAL CARE Performed by: Bethann Berkshire Total critical care time: 40 minutes Critical care time was exclusive of separately billable procedures and treating other patients. Critical care was necessary to treat or prevent imminent or life-threatening deterioration. Critical care was time spent personally by me on the following activities: development of treatment plan with patient and/or surrogate as well as nursing, discussions with consultants, evaluation of patient's response to treatment, examination of patient, obtaining history from patient or surrogate, ordering and performing treatments and interventions, ordering and review of laboratory studies, ordering and review of radiographic studies, pulse oximetry and re-evaluation of patient's condition.    MDM Rules/Calculators/A&P                          Substance improved with depression and questionable suicidal ideations.  Patient medically cleared will be seen by behavioral health Final Clinical Impression(s) / ED Diagnoses Final diagnoses:  None    Rx / DC Orders ED Discharge Orders     None        Bethann Berkshire, MD 08/02/20 1656

## 2020-08-03 MED ORDER — LOPERAMIDE HCL 2 MG PO CAPS: 2.0000 mg | ORAL_CAPSULE | ORAL | Status: DC | PRN

## 2020-08-03 MED ORDER — CLONIDINE HCL 0.1 MG PO TABS
0.1000 mg | ORAL_TABLET | Freq: Four times a day (QID) | ORAL | Status: DC
  Administered 2020-08-03 – 2020-08-04 (×6): 0.1 mg via ORAL
  Filled 2020-08-03 (×6): qty 1

## 2020-08-03 MED ORDER — DICYCLOMINE HCL 20 MG PO TABS: 20.0000 mg | ORAL_TABLET | Freq: Four times a day (QID) | ORAL | Status: DC | PRN

## 2020-08-03 MED ORDER — NAPROXEN 500 MG PO TABS: 500.0000 mg | ORAL_TABLET | Freq: Two times a day (BID) | ORAL | Status: DC | PRN

## 2020-08-03 MED ORDER — CLONIDINE HCL 0.1 MG PO TABS: 0.1000 mg | ORAL_TABLET | Freq: Every day | ORAL | Status: DC

## 2020-08-03 MED ORDER — CLONIDINE HCL 0.1 MG PO TABS: 0.1000 mg | ORAL_TABLET | Freq: Four times a day (QID) | ORAL | Status: DC

## 2020-08-03 MED ORDER — ONDANSETRON 4 MG PO TBDP: 4.0000 mg | ORAL_TABLET | Freq: Four times a day (QID) | ORAL | Status: DC | PRN

## 2020-08-03 MED ORDER — HYDROXYZINE HCL 25 MG PO TABS: 25.0000 mg | ORAL_TABLET | Freq: Four times a day (QID) | ORAL | Status: DC | PRN

## 2020-08-03 MED ORDER — METHOCARBAMOL 500 MG PO TABS: 500.0000 mg | ORAL_TABLET | Freq: Three times a day (TID) | ORAL | Status: DC | PRN

## 2020-08-03 MED ORDER — CLONIDINE HCL 0.1 MG PO TABS: 0.1000 mg | ORAL_TABLET | ORAL | Status: DC

## 2020-08-03 MED ORDER — NAPROXEN 500 MG PO TABS
500.0000 mg | ORAL_TABLET | Freq: Two times a day (BID) | ORAL | Status: DC | PRN
  Administered 2020-08-04: 500 mg via ORAL
  Filled 2020-08-03: qty 1

## 2020-08-03 NOTE — ED Notes (Addendum)
Pt changed into purple scrubs. Pt belongings placed in cabinet: Patient Belongings 19-22, [place]. Security notified to wanded pt.
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
## 2020-08-03 NOTE — Progress Notes (Signed)
Per Dagoberto Reef, pt has been accepted to [facility] 3W unit. Accepting provider is Dr. Baird Lyons. Patient can arrive tomorrow after 9:00am. Number for report is [phone].Please Fax IVC paperwork before calling report; Fax#: [phone].    Crissie Reese, MSW, LCSW-A Phone: [phone] Disposition/TOC

## 2020-08-03 NOTE — Consult Note (Signed)
[name] is a 30 year old female who presented to [facility] via EMS and [employer] after a friend called due to erratic behavior and threatening suicide then attempting to jump off her balcony ([employer] witnessed). Friend reported patient has been using crack and heroin for the last few days. Patient was later IVC'd. UDS+ Amphetamines, Benzodiazepines, Opiates, Cocaine; BAL<10. hCG<5.0.    Plan:   -Inpatient psychiatric hospitalization for further     observation, stabilization, and treatment.     -Patient accepted to [facility] for 08/04/20   -Agitation Protocol   -Clonidine (CATAPRES) Detox Protocol

## 2020-08-03 NOTE — ED Notes (Signed)
[name], pt's mother, would like to be called to provide collateral information. Phone #[phone].

## 2020-08-03 NOTE — Progress Notes (Signed)
Per Leroy Sea, patient meets criteria for inpatient treatment. There are no available or appropriate beds at [facility] today. CSW faxed referrals to the following facilities for review:  [facility]  TTS will continue to seek bed placement.  Crissie Reese, MSW, LCSW-A, LCAS-A Phone: [phone] Disposition/TOC

## 2020-08-04 NOTE — ED Notes (Signed)
Attempted to call report - states nurse will call me back

## 2020-08-04 NOTE — ED Notes (Signed)
Patient ambulatory to restroom and provided with ginger ale.

## 2020-09-03 ENCOUNTER — Other Ambulatory Visit: Payer: Self-pay

## 2020-09-03 ENCOUNTER — Encounter (HOSPITAL_COMMUNITY): Payer: Self-pay | Admitting: Psychiatry

## 2020-09-03 ENCOUNTER — Ambulatory Visit (INDEPENDENT_AMBULATORY_CARE_PROVIDER_SITE_OTHER): Payer: No Payment, Other | Admitting: Psychiatry

## 2020-09-03 VITALS — BP 110/76 | HR 65 | Ht 66.5 in | Wt 140.0 lb

## 2020-09-03 DIAGNOSIS — F1414 Cocaine abuse with cocaine-induced mood disorder: Secondary | ICD-10-CM

## 2020-09-03 MED ORDER — FLUOXETINE HCL 10 MG PO CAPS: 10.0000 mg | ORAL_CAPSULE | Freq: Every day | ORAL | 2 refills | Status: DC

## 2020-09-03 MED ORDER — GABAPENTIN 300 MG PO CAPS: 300.0000 mg | ORAL_CAPSULE | Freq: Three times a day (TID) | ORAL | 2 refills | Status: DC

## 2020-09-03 MED ORDER — QUETIAPINE FUMARATE 100 MG PO TABS
100.0000 mg | ORAL_TABLET | Freq: Every day | ORAL | 2 refills | Status: DC
Start: 2020-09-03 — End: 2021-04-10
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
## 2020-09-09 ENCOUNTER — Other Ambulatory Visit: Payer: Self-pay

## 2020-09-16 ENCOUNTER — Ambulatory Visit (HOSPITAL_COMMUNITY): Payer: Self-pay | Admitting: Clinical

## 2021-03-01 DIAGNOSIS — F1420 Cocaine dependence, uncomplicated: Secondary | ICD-10-CM | POA: Diagnosis not present

## 2021-03-01 DIAGNOSIS — F1120 Opioid dependence, uncomplicated: Secondary | ICD-10-CM | POA: Diagnosis not present

## 2021-03-03 DIAGNOSIS — F1120 Opioid dependence, uncomplicated: Secondary | ICD-10-CM | POA: Diagnosis not present

## 2021-03-03 DIAGNOSIS — F1420 Cocaine dependence, uncomplicated: Secondary | ICD-10-CM | POA: Diagnosis not present

## 2021-04-10 ENCOUNTER — Encounter (HOSPITAL_COMMUNITY): Payer: Self-pay | Admitting: Physician Assistant

## 2021-04-10 ENCOUNTER — Ambulatory Visit (INDEPENDENT_AMBULATORY_CARE_PROVIDER_SITE_OTHER): Payer: No Payment, Other | Admitting: Physician Assistant

## 2021-04-10 VITALS — BP 121/80 | HR 72 | Ht 71.0 in | Wt 146.0 lb

## 2021-04-10 DIAGNOSIS — F1994 Other psychoactive substance use, unspecified with psychoactive substance-induced mood disorder: Secondary | ICD-10-CM | POA: Diagnosis not present

## 2021-04-10 MED ORDER — GABAPENTIN 100 MG PO CAPS: 100.0000 mg | ORAL_CAPSULE | Freq: Three times a day (TID) | ORAL | 0 refills | Status: DC

## 2021-04-10 MED ORDER — QUETIAPINE FUMARATE 50 MG PO TABS: ORAL_TABLET | ORAL | 1 refills | Status: DC

## 2021-04-10 MED ORDER — FLUOXETINE HCL 10 MG PO CAPS: 10.0000 mg | ORAL_CAPSULE | Freq: Every day | ORAL | 1 refills | Status: DC

## 2021-04-10 NOTE — Progress Notes (Addendum)
BH MD/PA/NP OP Progress Note ? ?04/10/2021 6:17 PM ?[name]  ?MRN:  [id number] ? ?Chief Complaint:  ?Chief Complaint  ?Patient presents with  ? Walk-In  ?  WALK IN.  HX [place] 08/2021 ?Patient stated she did a 1/2 gram heroin ?day before  ? Medication Management  ? ?HPI:  ? ?[name] is a 31 year old female with a past psychiatric history significant for cocaine abuse with cocaine induced mood disorder who presents to [facility] as a walk-in to reestablish psychiatric care and for medication management.  Patient was last seen by Dr. Ronne Binning on 09/03/2020.  At the conclusion of the encounter with Dr. Ronne Binning, patient was taking the following medications: ? ?Seroquel 100 mg at bedtime ?Prozac 10 mg daily ?Gabapentin 300 mg 3 times daily ? ?Patient reports that she was recently hospitalized at [facility] due to suicidal ideations. Patient reports that she wasn't doing right when she was admitted to [facility]. Patient has a history of substance abuse and admits to using heroin in the morning. Patient reports that she quit smoking crack a month ago after previously been using for years. Patient that she plans on committing her self to [facility] for help with her substance abuse. ? ?Patient endorses depression all day, every day. Patient endorses the following depressive symptoms: exhaustion, no interest, lack of motivation, low mood, and feelings of worthlessness/guilt. Patient denies irritability and further denies anxiety. Patient reports that she has a past history of bipolar depression. Patient endorses the following manic symptoms: not sleeping, running around, racing thoughts, and hyperactivity. Patient also states that she has been experiencing the compulsion to use drugs. Patient denies any new stressors but states that she does not have housing or transportation. Patient endorses social support. A PHQ-9 screen was performed with the patient  scoring a 24. A GAD-7 screen was also performed with the patient scoring an 8. ? ?Patient is alert and oriented x4, calm, cooperative, and fully engaged in conversation during the encounter. Patient endorses ok mood. Patient denies suicidal or homicidal ideations. Patient further denies auditory or visual hallucination and does not appear to be responding to internal/external stimuli. Patient endorses hypersomnia and receives on average 12 hours of sleep each night. Patient endorses fair appetite and eat on average 2 meals a day. Patient denies alcohol consumption. Patient endorses tobacco use and smokes on average a half pack per day. 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 ? ?Visit Diagnosis:  ?  ICD-10-CM   ?1. Cocaine abuse with cocaine-induced mood disorder (HCC)  F14.14 FLUoxetine (PROZAC) 10 MG capsule  ?  QUEtiapine (SEROQUEL) 50 MG tablet  ?  gabapentin (NEURONTIN) 100 MG capsule  ?  ? ? ?Past Psychiatric History:  ?Anxiety ?Depression ?Bipolar disorder ?Substance use (opioids, cocaine, benzodiazepines, and amphetamine) ? ?Past Medical History:  ?Past Medical History:  ?Diagnosis Date  ? Anxiety   ? Atrial septal defect   ? corrected 1999  ? Bipolar disorder ([place])   ? Depression   ? Heart murmur   ? Mental health problem   ? Self-mutilation   ? Substance abuse ([place])   ?  ?Past Surgical History:  ?Procedure Laterality Date  ? ASD REPAIR    ? ? ?Family Psychiatric History:  ?Denies ? ?Family History:  ?Family History  ?Problem Relation Age of Onset  ? Cancer Mother   ?     breast -  both  ? Arthritis Maternal Grandmother   ?     rheumatoid  ? Cancer Maternal Grandfather   ?     brain  ? Alzheimer's disease Paternal Grandmother   ? Mental illness Neg Hx   ? Drug abuse Neg Hx   ? Suicidality Neg Hx   ? ? ?Social History:  ?Social History  ? ?Socioeconomic History  ? Marital status: Single  ?  Spouse name: Not on file  ? Number of children: Not on file  ?  Years of education: Not on file  ? Highest education level: Not on file  ?Occupational History  ? Not on file  ?Tobacco Use  ? Smoking status: Every Day  ?  Packs/day: 2.00  ?  Years: 7.00  ?  Pack years: 14.00  ?  Types: Cigarettes  ? Smokeless tobacco: Never  ?Substance and Sexual Activity  ? Alcohol use: Yes  ?  Comment: a few a week  ? Drug use: Not on file  ?  Comment: States no per pt  ? Sexual activity: Yes  ?  Birth control/protection: None  ?  Comment: number of sex partners in the last 41 months 1  ?Other Topics Concern  ? Not on file  ?Social History Narrative  ? Not on file  ? ?Social Determinants of Health  ? ?Financial Resource Strain: Not on file  ?Food Insecurity: Not on file  ?Transportation Needs: Not on file  ?Physical Activity: Not on file  ?Stress: Not on file  ?Social Connections: Not on file  ? ? ?Allergies: No Known Allergies ? ?Metabolic Disorder Labs: ?Lab Results  ?Component Value Date  ? HGBA1C 5.2 09/26/2015  ? MPG 103 09/26/2015  ? ?Lab Results  ?Component Value Date  ? PROLACTIN 32.7 (H) 09/26/2015  ? ?Lab Results  ?Component Value Date  ? CHOL 139 09/26/2015  ? TRIG 88 09/26/2015  ? HDL 56 09/26/2015  ? CHOLHDL 2.5 09/26/2015  ? VLDL 18 09/26/2015  ? [place] 65 09/26/2015  ? ?Lab Results  ?Component Value Date  ? TSH 0.907 09/26/2015  ? TSH 2.124 08/10/2012  ? ? ?Therapeutic Level Labs: ?Lab Results  ?Component Value Date  ? LITHIUM 1.14 02/20/2007  ? LITHIUM 0.29 (L) 02/16/2007  ? ?Lab Results  ?Component Value Date  ? VALPROATE 66 09/30/2015  ? VALPROATE 86.3 03/24/2011  ? ?No components found for:  CBMZ ? ?Current Medications: ?Current Outpatient Medications  ?Medication Sig Dispense Refill  ? ARIPiprazole ER 400 MG SUSR Inject 400 mg into the muscle every 28 (twenty-eight) days. To be give by PCP (Primary Care Provider) 1 each 0  ? divalproex (DEPAKOTE) 500 MG DR tablet Take 1 tablet (500 mg total) by mouth every 12 (twelve) hours. 60 tablet 0  ? FLUoxetine (PROZAC) 10 MG capsule  Take 1 capsule (10 mg total) by mouth daily. 30 capsule 1  ? gabapentin (NEURONTIN) 100 MG capsule Take 1 capsule (100 mg total) by mouth 3 (three) times daily. 90 capsule 0  ? QUEtiapine (SEROQUEL) 50 MG tablet Take 1 tablet (50 mg total) by mouth at bedtime for 4 days, THEN 2 tablets (100 mg total) at bedtime. 56 tablet 1  ? ?No current facility-administered medications for this visit.  ? ? ? ?Musculoskeletal: ?Strength & Muscle Tone: within normal limits ?Gait & Station: normal ?Patient leans: N/A ? ?Psychiatric Specialty Exam: ?Review of Systems  ?Psychiatric/Behavioral:  Positive for sleep disturbance. Negative for decreased concentration, dysphoric mood, hallucinations, self-injury and suicidal ideas.  The patient is not nervous/anxious and is not hyperactive.    ?Blood pressure 121/80, pulse 72, height 5\' 11"  (1.803 m), weight 146 lb (66.2 kg), SpO2 100 %.Body mass index is 20.36 kg/m?.  ?General Appearance: Casual  ?Eye Contact:  Good  ?Speech:  Clear and Coherent and Normal Rate  ?Volume:  Normal  ?Mood:  Depressed  ?Affect:  Congruent and Depressed  ?Thought Process:  Coherent and Descriptions of Associations: Intact  ?Orientation:  Full (Time, Place, and Person)  ?Thought Content: WDL   ?Suicidal Thoughts:  No  ?Homicidal Thoughts:  No  ?Memory:  Immediate;   Good ?Recent;   Good ?Remote;   Good  ?Judgement:  Fair  ?Insight:  Fair  ?Psychomotor Activity:  Normal  ?Concentration:  Concentration: Good and Attention Span: Good  ?Recall:  Good  ?Fund of Knowledge: Good  ?Language: Good  ?Akathisia:  No  ?Handed:  Right  ?AIMS (if indicated): not done  ?Assets:  Communication Skills ?Desire for Improvement ?Financial Resources/Insurance ?Housing ?Physical Health ?Social Support  ?ADL's:  Intact  ?Cognition: WNL  ?Sleep:  Fair  ? ?Screenings: ?AIMS   ? ?Flowsheet Row ED to Hosp-Admission (Discharged) from 09/24/2015 in [facility] [facility]  ?AIMS Total Score 0  ? ?  ? ?AUDIT    ? ?Flowsheet Row ED to Hosp-Admission (Discharged) from 09/24/2015 in [place] [facility]  ?Alcohol Use Disorder Identification Test Final Score (AUDIT) 0  ? ?  ? ?GAD-7   ? ?Flowsheet Row

## 2021-05-27 ENCOUNTER — Ambulatory Visit (INDEPENDENT_AMBULATORY_CARE_PROVIDER_SITE_OTHER): Payer: 59 | Admitting: Physician Assistant

## 2021-05-27 ENCOUNTER — Encounter (HOSPITAL_COMMUNITY): Payer: Self-pay | Admitting: Physician Assistant

## 2021-05-27 DIAGNOSIS — F1994 Other psychoactive substance use, unspecified with psychoactive substance-induced mood disorder: Secondary | ICD-10-CM | POA: Diagnosis not present

## 2021-05-27 MED ORDER — QUETIAPINE FUMARATE 150 MG PO TABS: 150.0000 mg | ORAL_TABLET | Freq: Every day | ORAL | 2 refills | Status: DC

## 2021-05-27 MED ORDER — FLUOXETINE HCL 20 MG PO CAPS: 20.0000 mg | ORAL_CAPSULE | Freq: Every day | ORAL | 2 refills | Status: DC

## 2021-05-27 MED ORDER — GABAPENTIN 400 MG PO CAPS: 400.0000 mg | ORAL_CAPSULE | Freq: Three times a day (TID) | ORAL | 1 refills | Status: DC

## 2021-05-27 NOTE — Progress Notes (Signed)
BH MD/PA/NP OP Progress Note ? ?05/27/2021 10:03 PM ?[name]  ?MRN:  [id number] ? ?Chief Complaint:  ?Chief Complaint  ?Patient presents with  ? Medication Management  ?  MED MGMT  ? Follow-up  ? ?HPI:  ? ?[name] is a 31 year old female with a past psychiatric history significant for substance-induced mood disorder who presents to [facility] for follow-up and medication management.  Patient is currently being managed on the following medications: ? ?Fluoxetine (Prozac) 10 mg daily ?Seroquel 100 mg at bedtime ?Gabapentin 100 mg 3 times daily ? ?Patient reports that she has been taking 300 mg of gabapentin for the management of her anxiety.  Patient reports no issues with her current medication regimen and is experiencing any adverse side effects from her current regimen.  Patient reports that her mood has been really good and denies experiencing depressive symptoms.  Patient reports that her anxiety has been manageable and rates her anxiety a 3 out of 10.  Patient admits to messing up once this week by engaging in illicit substance use.  Patient is interested in being set up with therapy. ? ?Patient is alert and oriented x4, pleasant, calm, cooperative, and fully engaged in conversation during the encounter.  Patient endorses being in a very good mood.  Patient denies suicidal or homicidal ideations.  She further denies auditory or visual hallucinations and does not appear to be responding to internal/external stimuli.  Patient endorses good sleep and receives on average 8 to 9 hours of sleep each night.  Patient endorses good appetite and eats on average 3-4 meals per day.  Patient denies alcohol consumption.  Patient further denies tobacco use but states that she does engage in vaping and utilizes 1/2 pack to a pack per day.  Patient denies illicit drug use but states that she recently used cocaine last Thursday. ? ?Visit Diagnosis:  ?  ICD-10-CM   ?1.  Substance induced mood disorder (HCC)  F19.94 gabapentin (NEURONTIN) 400 MG capsule  ?  FLUoxetine (PROZAC) 20 MG capsule  ?  QUEtiapine 150 MG TABS  ?  ? ? ?Past Psychiatric History:  ?Substance induced mood disorder ?Cocaine abuse ? ?Past Medical History:  ?Past Medical History:  ?Diagnosis Date  ? Anxiety   ? Atrial septal defect   ? corrected 1999  ? Bipolar disorder (HCC)   ? Depression   ? Heart murmur   ? Mental health problem   ? Self-mutilation   ? Substance abuse (HCC)   ?  ?Past Surgical History:  ?Procedure Laterality Date  ? ASD REPAIR    ? ? ?Family Psychiatric History:  ?Denies ? ?Family History:  ?Family History  ?Problem Relation Age of Onset  ? Cancer Mother   ?     breast - both  ? Arthritis Maternal Grandmother   ?     rheumatoid  ? Cancer Maternal Grandfather   ?     brain  ? Alzheimer's disease Paternal Grandmother   ? Mental illness Neg Hx   ? Drug abuse Neg Hx   ? Suicidality Neg Hx   ? ? ?Social History:  ?Social History  ? ?Socioeconomic History  ? Marital status: Single  ?  Spouse name: Not on file  ? Number of children: Not on file  ? Years of education: Not on file  ? Highest education level: Not on file  ?Occupational History  ? Not on file  ?Tobacco Use  ? Smoking status: Every  Day  ?  Packs/day: 2.00  ?  Years: 7.00  ?  Pack years: 14.00  ?  Types: Cigarettes  ? Smokeless tobacco: Never  ?Substance and Sexual Activity  ? Alcohol use: Yes  ?  Comment: a few a week  ? Drug use: Not on file  ?  Comment: States no per pt  ? Sexual activity: Yes  ?  Birth control/protection: None  ?  Comment: number of sex partners in the last 12 months 1  ?Other Topics Concern  ? Not on file  ?Social History Narrative  ? Not on file  ? ?Social Determinants of Health  ? ?Financial Resource Strain: Not on file  ?Food Insecurity: Not on file  ?Transportation Needs: Not on file  ?Physical Activity: Not on file  ?Stress: Not on file  ?Social Connections: Not on file  ? ? ?Allergies: No Known  Allergies ? ?Metabolic Disorder Labs: ?Lab Results  ?Component Value Date  ? HGBA1C 5.2 09/26/2015  ? MPG 103 09/26/2015  ? ?Lab Results  ?Component Value Date  ? PROLACTIN 32.7 (H) 09/26/2015  ? ?Lab Results  ?Component Value Date  ? CHOL 139 09/26/2015  ? TRIG 88 09/26/2015  ? HDL 56 09/26/2015  ? CHOLHDL 2.5 09/26/2015  ? VLDL 18 09/26/2015  ? LDLCALC 65 09/26/2015  ? ?Lab Results  ?Component Value Date  ? TSH 0.907 09/26/2015  ? TSH 2.124 08/10/2012  ? ? ?Therapeutic Level Labs: ?Lab Results  ?Component Value Date  ? LITHIUM 1.14 02/20/2007  ? LITHIUM 0.29 (L) 02/16/2007  ? ?Lab Results  ?Component Value Date  ? VALPROATE 66 09/30/2015  ? VALPROATE 86.3 03/24/2011  ? ?No components found for:  CBMZ ? ?Current Medications: ?Current Outpatient Medications  ?Medication Sig Dispense Refill  ? ARIPiprazole ER 400 MG SUSR Inject 400 mg into the muscle every 28 (twenty-eight) days. To be give by PCP (Primary Care Provider) 1 each 0  ? divalproex (DEPAKOTE) 500 MG DR tablet Take 1 tablet (500 mg total) by mouth every 12 (twelve) hours. 60 tablet 0  ? FLUoxetine (PROZAC) 20 MG capsule Take 1 capsule (20 mg total) by mouth daily. 30 capsule 2  ? gabapentin (NEURONTIN) 400 MG capsule Take 1 capsule (400 mg total) by mouth 3 (three) times daily. 90 capsule 1  ? QUEtiapine 150 MG TABS Take 150 mg by mouth at bedtime. 30 tablet 2  ? ?No current facility-administered medications for this visit.  ? ? ? ?Musculoskeletal: ?Strength & Muscle Tone: within normal limits ?Gait & Station: normal ?Patient leans: N/A ? ?Psychiatric Specialty Exam: ?Review of Systems  ?Psychiatric/Behavioral:  Negative for decreased concentration, dysphoric mood, hallucinations, self-injury, sleep disturbance and suicidal ideas. The patient is not nervous/anxious and is not hyperactive.    ?Blood pressure 136/74, pulse 66, height 5\' 11"  (1.803 m), weight 157 lb (71.2 kg).Body mass index is 21.9 kg/m?.  ?General Appearance: Well Groomed  ?Eye Contact:   Good  ?Speech:  Clear and Coherent and Normal Rate  ?Volume:  Normal  ?Mood:  Euthymic  ?Affect:  Appropriate  ?Thought Process:  Coherent, Goal Directed, and Descriptions of Associations: Intact  ?Orientation:  Full (Time, Place, and Person)  ?Thought Content: WDL   ?Suicidal Thoughts:  No  ?Homicidal Thoughts:  No  ?Memory:  Immediate;   Good ?Recent;   Good ?Remote;   Good  ?Judgement:  Fair  ?Insight:  Fair  ?Psychomotor Activity:  Normal  ?Concentration:  Concentration: Good and Attention Span: Good  ?  Recall:  Good  ?Fund of Knowledge: Good  ?Language: Good  ?Akathisia:  No  ?Handed:  Right  ?AIMS (if indicated): not done  ?Assets:  Communication Skills ?Desire for Improvement ?Financial Resources/Insurance ?Housing ?Physical Health ?Social Support  ?ADL's:  Intact  ?Cognition: WNL  ?Sleep:  Good  ? ?Screenings: ?AIMS   ? ?Flowsheet Row ED to [facility]  ?AIMS Total Score 0  ? ?  ? ?AUDIT   ? ?Flowsheet Row ED to [facility]Ad[facility]  ?Alcohol Use Disorder Identification Test Final Score (AUDIT) 0  ? ?  ? ?GAD-7   ? ?Flowsheet Row Office Visit from 05/27/2021 in [facility] Office Visit from 04/10/2021 in [facility] Office Visit from 09/03/2020 in [facility]  ?Total GAD-7 Score 0 8 0  ? ?  ? ?PHQ2-9   ? ?Flowsheet Row Office Visit from 05/27/2021 in [facility] Office Visit from 04/10/2021 in [facility] Office Visit from 09/03/2020 in [facility] Office Visit from 03/03/2016 in Primary Care at [facility] Visit from 05/29/2015 in Primary Care at [facility]  ?PHQ-2 Total Score 0 6 0 0 3  ?PHQ-9 Total Score -- 24 0 -- 9  ? ?  ? ?Flowsheet Row Office Visit from 05/27/2021 in [facility] Office Visit from 04/10/2021 in [facility]  ?C-SSRS RISK CATEGORY Low Risk Low Risk  ? ?  ? ? ? ?Assessment and Plan:  ? ?[name] is a 31 year old female with a past psychiatric h

## 2021-07-23 ENCOUNTER — Ambulatory Visit (HOSPITAL_COMMUNITY): Payer: 59 | Admitting: Licensed Clinical Social Worker

## 2021-07-24 ENCOUNTER — Other Ambulatory Visit (HOSPITAL_COMMUNITY): Payer: Self-pay | Admitting: Physician Assistant

## 2021-07-24 DIAGNOSIS — F1994 Other psychoactive substance use, unspecified with psychoactive substance-induced mood disorder: Secondary | ICD-10-CM

## 2021-08-04 ENCOUNTER — Telehealth (HOSPITAL_COMMUNITY): Payer: Self-pay | Admitting: *Deleted

## 2021-08-04 ENCOUNTER — Other Ambulatory Visit (HOSPITAL_COMMUNITY): Payer: Self-pay | Admitting: Psychiatry

## 2021-08-04 DIAGNOSIS — F1994 Other psychoactive substance use, unspecified with psychoactive substance-induced mood disorder: Secondary | ICD-10-CM

## 2021-08-04 MED ORDER — GABAPENTIN 400 MG PO CAPS: 400.0000 mg | ORAL_CAPSULE | Freq: Three times a day (TID) | ORAL | 3 refills | Status: DC

## 2021-08-04 NOTE — Telephone Encounter (Signed)
[name] is Out of Office Sending Refill Request to Dr B Doyne Keel  gabapentin (NEURONTIN) 400 MG capsule Take 1 capsule (400 mg total) by  mouth 3 (three) times daily

## 2021-08-04 NOTE — Telephone Encounter (Signed)
Medication refilled and sent to preferred pharmacy

## 2021-08-05 ENCOUNTER — Encounter (HOSPITAL_COMMUNITY): Payer: 59 | Admitting: Physician Assistant

## 2021-08-19 ENCOUNTER — Other Ambulatory Visit (HOSPITAL_COMMUNITY): Payer: Self-pay | Admitting: Physician Assistant

## 2021-08-19 DIAGNOSIS — F1994 Other psychoactive substance use, unspecified with psychoactive substance-induced mood disorder: Secondary | ICD-10-CM

## 2021-08-22 ENCOUNTER — Other Ambulatory Visit (HOSPITAL_COMMUNITY): Payer: Self-pay | Admitting: Student in an Organized Health Care Education/Training Program

## 2021-08-22 DIAGNOSIS — F1994 Other psychoactive substance use, unspecified with psychoactive substance-induced mood disorder: Secondary | ICD-10-CM

## 2021-08-22 MED ORDER — FLUOXETINE HCL 20 MG PO CAPS: 20.0000 mg | ORAL_CAPSULE | Freq: Every day | ORAL | 2 refills | Status: DC

## 2021-08-22 MED ORDER — QUETIAPINE FUMARATE 150 MG PO TABS: 150.0000 mg | ORAL_TABLET | Freq: Every day | ORAL | 2 refills | Status: DC

## 2021-08-22 NOTE — Telephone Encounter (Signed)
Patient needs a follow up appt scheduled

## 2021-08-29 ENCOUNTER — Telehealth (INDEPENDENT_AMBULATORY_CARE_PROVIDER_SITE_OTHER): Payer: 59 | Admitting: Psychiatry

## 2021-08-29 ENCOUNTER — Encounter (HOSPITAL_COMMUNITY): Payer: Self-pay | Admitting: Psychiatry

## 2021-08-29 DIAGNOSIS — F1994 Other psychoactive substance use, unspecified with psychoactive substance-induced mood disorder: Secondary | ICD-10-CM

## 2021-08-29 MED ORDER — FLUOXETINE HCL 20 MG PO CAPS: 20.0000 mg | ORAL_CAPSULE | Freq: Every day | ORAL | 3 refills | Status: DC

## 2021-08-29 MED ORDER — QUETIAPINE FUMARATE 200 MG PO TABS: 200.0000 mg | ORAL_TABLET | Freq: Every day | ORAL | 3 refills | Status: DC

## 2021-08-29 MED ORDER — GABAPENTIN 400 MG PO CAPS: 400.0000 mg | ORAL_CAPSULE | Freq: Three times a day (TID) | ORAL | 3 refills | Status: DC

## 2021-08-29 NOTE — Progress Notes (Signed)
BH MD/PA/NP OP Progress Note Virtual Visit via Telephone Note  I connected with [name] on 08/29/21 at  8:30 AM EDT by telephone and verified that I am speaking with the correct person using two identifiers.  Location: Patient: home Provider: Clinic   I discussed the limitations, risks, security and privacy concerns of performing an evaluation and management service by telephone and the availability of in person appointments. I also discussed with the patient that there may be a patient responsible charge related to this service. The patient expressed understanding and agreed to proceed.   I provided 30 minutes of non-face-to-face time during this encounter.  08/29/2021 9:03 AM [name]  MRN:  [id number]  Chief Complaint: "I not sleeping well"  HPI: 31 year old female seen today for follow-up psychiatric evaluation.  She has a psychiatric history of  Anxiety, ADHD, bipolar disorder, substance-induced mood disorder (opioids, tobacco, marijuana, and alcohol).  She is currently managed on Seroquel 150 mg nightly, Prozac 20 mg daily, and gabapentin 400 mg 3 times daily.  She informed Clinical research associate that her medications are somewhat effective in managing her psychiatric condition.  Today she was unable to logon virtually so her assessment is done over the phone.  During exam she was pleasant, cooperative, and engaged in conversation.  Patient's speech slurred during exam.  She notes that she has not been sleeping well.  She informed Clinical research associate that she sleeps approximately 6 hours but notes that her sleep is disturbed.  Patient informed writer that her anxiety and depression are well managed.  Provider conducted a GAD-7 and patient scored a 3.  Provider also conducted PHQ-9 and patient scored a 4.  She endorses adequate appetite.  Today she denies SI/HI/VAH, mania, or paranoia.  Patient informed Clinical research associate that she at times misuses opioids.  She also notes that she drinks alcohol on occasions.   Patient informed Clinical research associate that she would like to start Suboxone.  Provider gave patient references to Suboxone treatment facilities.  Today patient agreeable to increasing Seroquel 150 mg nightly to 200 mg nightly to help manage sleep.  She will continue her other medications as prescribed.  Patient referred to outpatient counseling for therapy.  No other concerns at this time. Visit Diagnosis:    ICD-10-CM   1. Substance induced mood disorder (HCC)  F19.94 FLUoxetine (PROZAC) 20 MG capsule    gabapentin (NEURONTIN) 400 MG capsule    QUEtiapine (SEROQUEL) 200 MG tablet    Ambulatory referral to Social Work      Past Psychiatric History: Anxiety, ADHD, bipolar disorder, substance-induced mood disorder (opioids, tobacco, marijuana, and alcohol).   Past Medical History:  Past Medical History:  Diagnosis Date   Anxiety    Atrial septal defect    corrected 1999   Bipolar disorder (HCC)    Depression    Heart murmur    Mental health problem    Self-mutilation    Substance abuse (HCC)     Past Surgical History:  Procedure Laterality Date   ASD REPAIR      Family Psychiatric History: Denies  Family History:  Family History  Problem Relation Age of Onset   Cancer Mother        breast - both   Arthritis Maternal Grandmother        rheumatoid   Cancer Maternal Grandfather        brain   Alzheimer's disease Paternal Grandmother    Mental illness Neg Hx    Drug abuse Neg Hx  Suicidality Neg Hx     Social History:  Social History   Socioeconomic History   Marital status: Single    Spouse name: Not on file   Number of children: Not on file   Years of education: Not on file   Highest education level: Not on file  Occupational History   Not on file  Tobacco Use   Smoking status: Every Day    Packs/day: 2.00    Years: 7.00    Total pack years: 14.00    Types: Cigarettes   Smokeless tobacco: Never  Substance and Sexual Activity   Alcohol use: Yes    Comment: a few a  week   Drug use: Not on file    Comment: States no per pt   Sexual activity: Yes    Birth control/protection: None    Comment: number of sex partners in the last 12 months 1  Other Topics Concern   Not on file  Social History Narrative   Not on file   Social Determinants of Health   Financial Resource Strain: Not on file  Food Insecurity: Not on file  Transportation Needs: Not on file  Physical Activity: Not on file  Stress: Not on file  Social Connections: Not on file    Allergies: No Known Allergies  Metabolic Disorder Labs: Lab Results  Component Value Date   HGBA1C 5.2 09/26/2015   MPG 103 09/26/2015   Lab Results  Component Value Date   PROLACTIN 32.7 (H) 09/26/2015   Lab Results  Component Value Date   CHOL 139 09/26/2015   TRIG 88 09/26/2015   HDL 56 09/26/2015   CHOLHDL 2.5 09/26/2015   VLDL 18 09/26/2015   LDLCALC 65 09/26/2015   Lab Results  Component Value Date   TSH 0.907 09/26/2015   TSH 2.124 08/10/2012    Therapeutic Level Labs: Lab Results  Component Value Date   LITHIUM 1.14 02/20/2007   LITHIUM 0.29 (L) 02/16/2007   Lab Results  Component Value Date   VALPROATE 66 09/30/2015   VALPROATE 86.3 03/24/2011   No results found for: "CBMZ"  Current Medications: Current Outpatient Medications  Medication Sig Dispense Refill   QUEtiapine (SEROQUEL) 200 MG tablet Take 1 tablet (200 mg total) by mouth at bedtime. 30 tablet 3   FLUoxetine (PROZAC) 20 MG capsule Take 1 capsule (20 mg total) by mouth daily. 30 capsule 3   gabapentin (NEURONTIN) 400 MG capsule Take 1 capsule (400 mg total) by mouth 3 (three) times daily. 90 capsule 3   No current facility-administered medications for this visit.     Musculoskeletal: Strength & Muscle Tone:  Unable to assess due to telephone visit Gait & Station:  Unable to assess due to telephone visit Patient leans: N/A  Psychiatric Specialty Exam: Review of Systems  There were no vitals taken for  this visit.There is no height or weight on file to calculate BMI.  General Appearance:  Unable to assess due to telephone visit  Eye Contact:   Unable to assess due to telephone visit  Speech:  Normal Rate and Slurred  Volume:  Normal  Mood:  Euthymic  Affect:  Appropriate and Congruent  Thought Process:  Coherent, Goal Directed, and Linear  Orientation:  Full (Time, Place, and Person)  Thought Content: WDL and Logical   Suicidal Thoughts:  No  Homicidal Thoughts:  No  Memory:  Immediate;   Good Recent;   Good Remote;   Good  Judgement:  Fair  Insight:  Fair  Psychomotor Activity:   Unable to assess due to telephone visit  Concentration:  Concentration: Good and Attention Span: Good  Recall:  Good  Fund of Knowledge: Good  Language: Good  Akathisia:   Unable to assess due to telephone visit  Handed:  Right  AIMS (if indicated): not done  Assets:  Communication Skills Desire for Improvement Financial Resources/Insurance Physical Health Social Support  ADL's:  Intact  Cognition: WNL  Sleep:  Fair   Screenings: AIMS    Flowsheet Row ED to [facility]  AIMS Total Score 0      AUDIT    Flowsheet Row ED to [facility]osp-Ad[facility] (Discharged[facility]  Alcohol Use Disorder Identification Test Final Score (AUDIT) 0      GAD-7    Flowsheet Row Video Visit from 08/29/2021 in [facility] Office Visit from 05/27/2021 in [facility] Office Visit from 04/10/2021 in [facility] Office Visit from 09/03/2020 in [facility]  Total GAD-7 Score 3 0 8 0      PHQ2-9    Flowsheet Row Video Visit from 08/29/2021 in [facility] Office Visit from 05/27/2021 in [facility] Office Visit from 04/10/2021  in [facility] Office Visit from 09/03/2020 in [facility] Office Visit from 03/03/2016 in [facility] Total Score 0 0 6 0 0  PHQ-9 Total Score 4 -- 24 0 --      Flowsheet Row Office Visit from 05/27/2021 in [facility] Office Visit from 04/10/2021 in [facility]  C-SSRS RISK CATEGORY Low Risk Low Risk        Assessment and Plan: Patient endorses poor sleep.  Today she is agreeable to increasing Seroquel 150 mg nightly to 200 mg nightly to help manage sleep.  Patient interested in starting Suboxone.  Provider gave patient resources to several Suboxone treatment centers in the area.  She will continue all other medications as prescribed.  Patient referred to therapy for counseling. 1. Substance induced mood disorder (HCC)  Continue- FLUoxetine (PROZAC) 20 MG capsule; Take 1 capsule (20 mg total) by mouth daily.  Dispense: 30 capsule; Refill: 3 Continue- gabapentin (NEURONTIN) 400 MG capsule; Take 1 capsule (400 mg total) by mouth 3 (three) times daily.  Dispense: 90 capsule; Refill: 3 Increased- QUEtiapine (SEROQUEL) 200 MG tablet; Take 1 tablet (200 mg total) by mouth at bedtime.  Dispense: 30 tablet; Refill: 3 - Ambulatory referral to Social Work    Collaboration of Care: Collaboration of Care: Other provider involved in patient's care AEB primary psychiatric provider  Patient/Guardian was advised Release of Information must be obtained prior to any record release in order to collaborate their care with an outside provider. Patient/Guardian was advised if they have not already done so to contact the registration department to sign all necessary forms in order for [place] to release information regarding their care.   Consent: Patient/Guardian gives verbal consent for treatment and assignment of benefits for services provided during this visit. Patient/Guardian  expressed understanding and agreed to proceed.   Follow-up in 3 months Follow-up with therapy Shanna Cisco, NP 08/29/2021, 9:03 AM

## 2021-09-01 ENCOUNTER — Other Ambulatory Visit (HOSPITAL_COMMUNITY): Payer: Self-pay | Admitting: Psychiatry

## 2021-09-03 ENCOUNTER — Ambulatory Visit (HOSPITAL_COMMUNITY): Payer: 59 | Admitting: Mental Health

## 2021-10-01 DIAGNOSIS — F1410 Cocaine abuse, uncomplicated: Secondary | ICD-10-CM | POA: Diagnosis not present

## 2021-10-01 DIAGNOSIS — F1120 Opioid dependence, uncomplicated: Secondary | ICD-10-CM | POA: Diagnosis not present

## 2021-10-14 ENCOUNTER — Other Ambulatory Visit (HOSPITAL_COMMUNITY): Payer: Self-pay | Admitting: Psychiatry

## 2021-10-14 DIAGNOSIS — F1994 Other psychoactive substance use, unspecified with psychoactive substance-induced mood disorder: Secondary | ICD-10-CM

## 2021-11-15 ENCOUNTER — Other Ambulatory Visit (HOSPITAL_COMMUNITY): Payer: Self-pay | Admitting: Psychiatry

## 2021-11-15 DIAGNOSIS — F1994 Other psychoactive substance use, unspecified with psychoactive substance-induced mood disorder: Secondary | ICD-10-CM

## 2021-11-20 ENCOUNTER — Telehealth (HOSPITAL_COMMUNITY): Payer: Self-pay | Admitting: Physician Assistant

## 2022-01-27 ENCOUNTER — Other Ambulatory Visit (HOSPITAL_COMMUNITY): Payer: Self-pay | Admitting: Psychiatry

## 2022-01-29 ENCOUNTER — Emergency Department (HOSPITAL_BASED_OUTPATIENT_CLINIC_OR_DEPARTMENT_OTHER): Admission: EM | Admit: 2022-01-29 | Discharge: 2022-01-29 | Discharge status: Against Medical Advice | Payer: Self-pay

## 2022-01-29 ENCOUNTER — Other Ambulatory Visit: Payer: Self-pay

## 2022-02-19 ENCOUNTER — Other Ambulatory Visit (HOSPITAL_COMMUNITY): Payer: Self-pay | Admitting: Psychiatry

## 2022-02-19 DIAGNOSIS — F1994 Other psychoactive substance use, unspecified with psychoactive substance-induced mood disorder: Secondary | ICD-10-CM

## 2022-03-05 ENCOUNTER — Telehealth (HOSPITAL_COMMUNITY): Payer: Self-pay | Admitting: Physician Assistant

## 2022-04-02 ENCOUNTER — Other Ambulatory Visit: Payer: Self-pay

## 2022-04-02 ENCOUNTER — Inpatient Hospital Stay (HOSPITAL_BASED_OUTPATIENT_CLINIC_OR_DEPARTMENT_OTHER)
Admission: EM | Admit: 2022-04-02 | Discharge: 2022-04-03 | DRG: 871 | Disposition: A | Discharge status: Home / Self Care | Payer: 59 | Attending: Internal Medicine | Admitting: Internal Medicine

## 2022-04-02 ENCOUNTER — Emergency Department (HOSPITAL_BASED_OUTPATIENT_CLINIC_OR_DEPARTMENT_OTHER): Payer: 59

## 2022-04-02 ENCOUNTER — Encounter (HOSPITAL_BASED_OUTPATIENT_CLINIC_OR_DEPARTMENT_OTHER): Payer: Self-pay | Admitting: Emergency Medicine

## 2022-04-02 DIAGNOSIS — F1910 Other psychoactive substance abuse, uncomplicated: Secondary | ICD-10-CM | POA: Diagnosis present

## 2022-04-02 DIAGNOSIS — F1410 Cocaine abuse, uncomplicated: Secondary | ICD-10-CM | POA: Diagnosis present

## 2022-04-02 DIAGNOSIS — R042 Hemoptysis: Secondary | ICD-10-CM | POA: Diagnosis not present

## 2022-04-02 DIAGNOSIS — Z9152 Personal history of nonsuicidal self-harm: Secondary | ICD-10-CM | POA: Diagnosis not present

## 2022-04-02 DIAGNOSIS — Z808 Family history of malignant neoplasm of other organs or systems: Secondary | ICD-10-CM

## 2022-04-02 DIAGNOSIS — R6520 Severe sepsis without septic shock: Secondary | ICD-10-CM | POA: Diagnosis present

## 2022-04-02 DIAGNOSIS — A419 Sepsis, unspecified organism: Secondary | ICD-10-CM | POA: Diagnosis not present

## 2022-04-02 DIAGNOSIS — Z1152 Encounter for screening for COVID-19: Secondary | ICD-10-CM | POA: Diagnosis not present

## 2022-04-02 DIAGNOSIS — Z8261 Family history of arthritis: Secondary | ICD-10-CM | POA: Diagnosis not present

## 2022-04-02 DIAGNOSIS — F1914 Other psychoactive substance abuse with psychoactive substance-induced mood disorder: Secondary | ICD-10-CM | POA: Diagnosis not present

## 2022-04-02 DIAGNOSIS — F1414 Cocaine abuse with cocaine-induced mood disorder: Secondary | ICD-10-CM | POA: Diagnosis not present

## 2022-04-02 DIAGNOSIS — Z803 Family history of malignant neoplasm of breast: Secondary | ICD-10-CM | POA: Diagnosis not present

## 2022-04-02 DIAGNOSIS — F1994 Other psychoactive substance use, unspecified with psychoactive substance-induced mood disorder: Secondary | ICD-10-CM | POA: Diagnosis present

## 2022-04-02 DIAGNOSIS — F17210 Nicotine dependence, cigarettes, uncomplicated: Secondary | ICD-10-CM | POA: Diagnosis not present

## 2022-04-02 DIAGNOSIS — F17200 Nicotine dependence, unspecified, uncomplicated: Secondary | ICD-10-CM | POA: Diagnosis not present

## 2022-04-02 DIAGNOSIS — R319 Hematuria, unspecified: Secondary | ICD-10-CM | POA: Diagnosis not present

## 2022-04-02 DIAGNOSIS — J188 Other pneumonia, unspecified organism: Secondary | ICD-10-CM | POA: Diagnosis present

## 2022-04-02 DIAGNOSIS — F319 Bipolar disorder, unspecified: Secondary | ICD-10-CM | POA: Diagnosis not present

## 2022-04-02 DIAGNOSIS — Z79899 Other long term (current) drug therapy: Secondary | ICD-10-CM | POA: Diagnosis not present

## 2022-04-02 DIAGNOSIS — Z820 Family history of epilepsy and other diseases of the nervous system: Secondary | ICD-10-CM | POA: Diagnosis not present

## 2022-04-02 DIAGNOSIS — J9601 Acute respiratory failure with hypoxia: Secondary | ICD-10-CM | POA: Diagnosis not present

## 2022-04-02 DIAGNOSIS — J189 Pneumonia, unspecified organism: Secondary | ICD-10-CM | POA: Diagnosis not present

## 2022-04-02 DIAGNOSIS — R000 Tachycardia, unspecified: Secondary | ICD-10-CM | POA: Diagnosis not present

## 2022-04-02 DIAGNOSIS — F1110 Opioid abuse, uncomplicated: Secondary | ICD-10-CM | POA: Diagnosis not present

## 2022-04-02 DIAGNOSIS — R0902 Hypoxemia: Secondary | ICD-10-CM | POA: Diagnosis not present

## 2022-04-02 LAB — COMPREHENSIVE METABOLIC PANEL
ALT: 14 U/L (ref 0–44)
AST: 10 U/L — ABNORMAL LOW (ref 15–41)
Albumin: 4 g/dL (ref 3.5–5.0)
Alkaline Phosphatase: 56 U/L (ref 38–126)
Anion gap: 11 (ref 5–15)
BUN: 12 mg/dL (ref 6–20)
CO2: 25 mmol/L (ref 22–32)
Calcium: 9.6 mg/dL (ref 8.9–10.3)
Chloride: 99 mmol/L (ref 98–111)
Creatinine, Ser: 0.71 mg/dL (ref 0.44–1.00)
GFR, Estimated: >60 mL/min (ref >60)
Glucose, Bld: 106 mg/dL — ABNORMAL HIGH (ref 70–99)
Potassium: 3.5 mmol/L (ref 3.5–5.1)
Sodium: 135 mmol/L (ref 135–145)
Total Bilirubin: 0.5 mg/dL (ref 0.3–1.2)
Total Protein: 7.8 g/dL (ref 6.5–8.1)

## 2022-04-02 LAB — CBC WITH DIFFERENTIAL/PLATELET
Abs Immature Granulocytes: 0.09 10*3/uL — ABNORMAL HIGH (ref 0.00–0.07)
Basophils Absolute: 0 10*3/uL (ref 0.0–0.1)
Basophils Relative: 0 %
Eosinophils Absolute: 0 10*3/uL (ref 0.0–0.5)
Eosinophils Relative: 0 %
HCT: 47.2 % — ABNORMAL HIGH (ref 36.0–46.0)
Hemoglobin: 16 g/dL — ABNORMAL HIGH (ref 12.0–15.0)
Immature Granulocytes: 1 %
Lymphocytes Relative: 7 %
Lymphs Abs: 1 10*3/uL (ref 0.7–4.0)
MCH: 29.6 pg (ref 26.0–34.0)
MCHC: 33.9 g/dL (ref 30.0–36.0)
MCV: 87.2 fL (ref 80.0–100.0)
Monocytes Absolute: 1.5 10*3/uL — ABNORMAL HIGH (ref 0.1–1.0)
Monocytes Relative: 10 %
Neutro Abs: 12.4 10*3/uL — ABNORMAL HIGH (ref 1.7–7.7)
Neutrophils Relative %: 82 %
Platelets: 280 10*3/uL (ref 150–400)
RBC: 5.41 MIL/uL — ABNORMAL HIGH (ref 3.87–5.11)
RDW: 15 % (ref 11.5–15.5)
WBC: 15.1 10*3/uL — ABNORMAL HIGH (ref 4.0–10.5)
nRBC: 0 % (ref 0.0–0.2)

## 2022-04-02 LAB — RAPID URINE DRUG SCREEN, HOSP PERFORMED
Amphetamines: NOT DETECTED
Barbiturates: NOT DETECTED
Benzodiazepines: NOT DETECTED
Cocaine: POSITIVE — AB
Opiates: NOT DETECTED
Tetrahydrocannabinol: NOT DETECTED

## 2022-04-02 LAB — PROTIME-INR
INR: 1.2 (ref 0.8–1.2)
Prothrombin Time: 15.2 seconds (ref 11.4–15.2)

## 2022-04-02 LAB — URINALYSIS, ROUTINE W REFLEX MICROSCOPIC
Bacteria, UA: NONE SEEN
Bilirubin Urine: NEGATIVE
Glucose, UA: NEGATIVE mg/dL
Ketones, ur: NEGATIVE mg/dL
Leukocytes,Ua: NEGATIVE
Nitrite: NEGATIVE
Protein, ur: 30 mg/dL — AB
RBC / HPF: >50 RBC/hpf (ref 0–5)
Specific Gravity, Urine: 1.032 — ABNORMAL HIGH (ref 1.005–1.030)
pH: 6 (ref 5.0–8.0)

## 2022-04-02 LAB — I-STAT VENOUS BLOOD GAS, ED
Acid-base deficit: 2 mmol/L (ref 0.0–2.0)
Bicarbonate: 23.3 mmol/L (ref 20.0–28.0)
Calcium, Ion: 1.17 mmol/L (ref 1.15–1.40)
HCT: 47 % — ABNORMAL HIGH (ref 36.0–46.0)
Hemoglobin: 16 g/dL — ABNORMAL HIGH (ref 12.0–15.0)
O2 Saturation: 86 %
Potassium: 3.5 mmol/L (ref 3.5–5.1)
Sodium: 135 mmol/L (ref 135–145)
TCO2: 25 mmol/L (ref 22–32)
pCO2, Ven: 40.2 mmHg — ABNORMAL LOW (ref 44–60)
pH, Ven: 7.372 (ref 7.25–7.43)
pO2, Ven: 53 mmHg — ABNORMAL HIGH (ref 32–45)

## 2022-04-02 LAB — RESP PANEL BY RT-PCR (RSV, FLU A&B, COVID)  RVPGX2
Influenza A by PCR: NEGATIVE
Influenza B by PCR: NEGATIVE
Resp Syncytial Virus by PCR: NEGATIVE
SARS Coronavirus 2 by RT PCR: NEGATIVE

## 2022-04-02 LAB — ETHANOL: Alcohol, Ethyl (B): <10 mg/dL (ref <10)

## 2022-04-02 LAB — APTT: aPTT: 25 seconds (ref 24–36)

## 2022-04-02 LAB — PROCALCITONIN: Procalcitonin: 1.91 ng/mL

## 2022-04-02 LAB — TROPONIN I (HIGH SENSITIVITY)
Troponin I (High Sensitivity): 7 ng/L (ref <18)
Troponin I (High Sensitivity): 8 ng/L (ref <18)

## 2022-04-02 LAB — MRSA NEXT GEN BY PCR, NASAL: MRSA by PCR Next Gen: NOT DETECTED

## 2022-04-02 LAB — HCG, QUANTITATIVE, PREGNANCY: hCG, Beta Chain, Quant, S: <1 m[IU]/mL (ref <5)

## 2022-04-02 LAB — BRAIN NATRIURETIC PEPTIDE: B Natriuretic Peptide: 164 pg/mL — ABNORMAL HIGH (ref 0.0–100.0)

## 2022-04-02 MED ORDER — ONDANSETRON HCL 4 MG/2ML IJ SOLN: 4.0000 mg | Freq: Three times a day (TID) | INTRAMUSCULAR | Status: DC | PRN

## 2022-04-02 MED ORDER — SODIUM CHLORIDE 0.9% FLUSH: 3.0000 mL | INTRAVENOUS | Status: DC | PRN

## 2022-04-02 MED ORDER — ACETAMINOPHEN 650 MG RE SUPP: 650.0000 mg | Freq: Four times a day (QID) | RECTAL | Status: DC | PRN

## 2022-04-02 MED ORDER — ALBUTEROL SULFATE (2.5 MG/3ML) 0.083% IN NEBU: 3.0000 mL | INHALATION_SOLUTION | Freq: Four times a day (QID) | RESPIRATORY_TRACT | Status: DC | PRN

## 2022-04-02 MED ORDER — QUETIAPINE FUMARATE 100 MG PO TABS
100.0000 mg | ORAL_TABLET | Freq: Every day | ORAL | Status: DC
  Administered 2022-04-02: 100 mg via ORAL
  Filled 2022-04-02: qty 1

## 2022-04-02 MED ORDER — SODIUM CHLORIDE 0.9 % IV SOLN
500.0000 mg | INTRAVENOUS | Status: DC
  Administered 2022-04-02: 500 mg via INTRAVENOUS

## 2022-04-02 MED ORDER — VANCOMYCIN HCL IN DEXTROSE 1-5 GM/200ML-% IV SOLN
1000.0000 mg | Freq: Once | INTRAVENOUS | Status: AC
  Administered 2022-04-02: 1000 mg via INTRAVENOUS
  Filled 2022-04-02: qty 200

## 2022-04-02 MED ORDER — SODIUM CHLORIDE 0.9% FLUSH
3.0000 mL | Freq: Two times a day (BID) | INTRAVENOUS | Status: DC
  Administered 2022-04-02 – 2022-04-03 (×2): 3 mL via INTRAVENOUS

## 2022-04-02 MED ORDER — PIPERACILLIN-TAZOBACTAM 3.375 G IVPB 30 MIN
3.3750 g | Freq: Once | INTRAVENOUS | Status: AC
  Administered 2022-04-02: 3.375 g via INTRAVENOUS
  Filled 2022-04-02: qty 50

## 2022-04-02 MED ORDER — SODIUM CHLORIDE 0.9 % IV BOLUS (SEPSIS): 1000.0000 mL | Freq: Once | INTRAVENOUS | Status: DC

## 2022-04-02 MED ORDER — ACETAMINOPHEN 325 MG PO TABS
650.0000 mg | ORAL_TABLET | Freq: Four times a day (QID) | ORAL | Status: DC | PRN
  Administered 2022-04-02: 650 mg via ORAL
  Filled 2022-04-02: qty 2

## 2022-04-02 MED ORDER — ACETAMINOPHEN 500 MG PO TABS
1000.0000 mg | ORAL_TABLET | Freq: Once | ORAL | Status: AC
  Administered 2022-04-02: 1000 mg via ORAL
  Filled 2022-04-02: qty 2

## 2022-04-02 MED ORDER — VANCOMYCIN HCL IN DEXTROSE 750-5 MG/150ML-% IV SOLN
750.0000 mg | Freq: Three times a day (TID) | INTRAVENOUS | Status: DC
  Filled 2022-04-02 (×2): qty 150

## 2022-04-02 MED ORDER — LOPERAMIDE HCL 2 MG PO CAPS: 2.0000 mg | ORAL_CAPSULE | ORAL | Status: DC | PRN

## 2022-04-02 MED ORDER — SODIUM CHLORIDE 0.9 % IV SOLN
2.0000 g | Freq: Three times a day (TID) | INTRAVENOUS | Status: DC
  Administered 2022-04-02 – 2022-04-03 (×3): 2 g via INTRAVENOUS
  Filled 2022-04-02 (×3): qty 12.5

## 2022-04-02 MED ORDER — NICOTINE 21 MG/24HR TD PT24
21.0000 mg | MEDICATED_PATCH | Freq: Every day | TRANSDERMAL | Status: DC
  Administered 2022-04-02 – 2022-04-03 (×2): 21 mg via TRANSDERMAL
  Filled 2022-04-02 (×2): qty 1

## 2022-04-02 MED ORDER — SODIUM CHLORIDE 0.9 % IV SOLN: INTRAVENOUS | Status: DC

## 2022-04-02 MED ORDER — SODIUM CHLORIDE 0.9 % IV SOLN
500.0000 mg | INTRAVENOUS | Status: DC
  Filled 2022-04-02: qty 5

## 2022-04-02 MED ORDER — SODIUM CHLORIDE 0.9 % IV SOLN: Freq: Once | INTRAVENOUS | Status: AC

## 2022-04-02 MED ORDER — FLUOXETINE HCL 20 MG PO CAPS
20.0000 mg | ORAL_CAPSULE | Freq: Every day | ORAL | Status: DC
  Administered 2022-04-03: 20 mg via ORAL
  Filled 2022-04-02: qty 1

## 2022-04-02 MED ORDER — METHOCARBAMOL 500 MG PO TABS
500.0000 mg | ORAL_TABLET | Freq: Three times a day (TID) | ORAL | Status: DC | PRN
  Administered 2022-04-02: 500 mg via ORAL
  Filled 2022-04-02: qty 1

## 2022-04-02 MED ORDER — CHLORHEXIDINE GLUCONATE CLOTH 2 % EX PADS
6.0000 | MEDICATED_PAD | Freq: Every day | CUTANEOUS | Status: DC
  Administered 2022-04-02 – 2022-04-03 (×2): 6 via TOPICAL

## 2022-04-02 MED ORDER — SODIUM CHLORIDE 0.9 % IV SOLN: 250.0000 mL | INTRAVENOUS | Status: DC | PRN

## 2022-04-02 MED ORDER — SODIUM CHLORIDE 0.9 % IV SOLN: 500.0000 mg | INTRAVENOUS | Status: DC

## 2022-04-02 MED ORDER — DICYCLOMINE HCL 10 MG PO CAPS: 20.0000 mg | ORAL_CAPSULE | Freq: Four times a day (QID) | ORAL | Status: DC | PRN

## 2022-04-02 MED ORDER — LACTATED RINGERS IV BOLUS
1000.0000 mL | Freq: Once | INTRAVENOUS | Status: AC
  Administered 2022-04-02: 1000 mL via INTRAVENOUS

## 2022-04-02 MED ORDER — VANCOMYCIN HCL 750 MG/150ML IV SOLN
750.0000 mg | Freq: Three times a day (TID) | INTRAVENOUS | Status: DC
  Administered 2022-04-02 – 2022-04-03 (×2): 750 mg via INTRAVENOUS
  Filled 2022-04-02 (×4): qty 150

## 2022-04-02 MED ORDER — DEXAMETHASONE SODIUM PHOSPHATE 10 MG/ML IJ SOLN
10.0000 mg | Freq: Once | INTRAMUSCULAR | Status: AC
  Administered 2022-04-02: 10 mg via INTRAVENOUS
  Filled 2022-04-02: qty 1

## 2022-04-02 MED ORDER — GABAPENTIN 400 MG PO CAPS
400.0000 mg | ORAL_CAPSULE | Freq: Three times a day (TID) | ORAL | Status: DC
  Administered 2022-04-02 – 2022-04-03 (×3): 400 mg via ORAL
  Filled 2022-04-02 (×3): qty 1

## 2022-04-02 MED ORDER — ORAL CARE MOUTH RINSE: 15.0000 mL | OROMUCOSAL | Status: DC | PRN

## 2022-04-02 MED ORDER — SODIUM CHLORIDE 0.9 % IV BOLUS (SEPSIS)
1000.0000 mL | Freq: Once | INTRAVENOUS | Status: AC
  Administered 2022-04-02: 1000 mL via INTRAVENOUS

## 2022-04-02 MED ORDER — IOHEXOL 350 MG/ML SOLN
100.0000 mL | Freq: Once | INTRAVENOUS | Status: AC | PRN
  Administered 2022-04-02: 75 mL via INTRAVENOUS

## 2022-04-02 MED ORDER — HYDROXYZINE HCL 25 MG PO TABS
25.0000 mg | ORAL_TABLET | Freq: Four times a day (QID) | ORAL | Status: DC | PRN
  Administered 2022-04-03: 25 mg via ORAL
  Filled 2022-04-02: qty 1

## 2022-04-02 NOTE — ED Notes (Signed)
[name] with cl called for transport

## 2022-04-02 NOTE — Progress Notes (Signed)
Pharmacy Antibiotic Note  [name] is a 32 y.o. female admitted on 04/02/2022 with pneumonia.  Pharmacy has been consulted for cefepime dosing.  Plan: Cefepime 2gm iv q8h.  Height: 5' 10.5" (179.1 cm) Weight: 68.2 kg (150 lb 5.7 oz) IBW/kg (Calculated) : 69.65  Temp (24hrs), Avg:99.2 F (37.3 C), Min:98.6 F (37 C), Max:99.7 F (37.6 C)  Recent Labs  Lab 04/02/22 1346  WBC 15.1*  CREATININE 0.71    Estimated Creatinine Clearance: 108.7 mL/min (by C-G formula based on SCr of 0.71 mg/dL).    No Known Allergies  Antimicrobials this admission: 3/7 cefepime >> 3/12 3/7 vancomyicin >>    Microbiology results: 3/7 BCx: pending 3/7 Sputum: pending 3/7 MRSA PCR: negative  Thank you for allowing pharmacy to be a part of this patient's care.  [name] 04/02/2022 8:52 PM

## 2022-04-02 NOTE — Assessment & Plan Note (Signed)
Repeat UA Check to make sure not on period

## 2022-04-02 NOTE — Assessment & Plan Note (Signed)
Sepsis criteria met and secondary to multifocal pneumonia Received IVF in ED, will continue maintenance dose HR/RR have normalized Continue to trend WBC Continue abx per above

## 2022-04-02 NOTE — Assessment & Plan Note (Addendum)
32 year old presenting with 1 day history of acute onset of shortness of breath found to be septic and hypoxic to 78% on room air due to multifocal pneumonia in setting of polysubstance abuse -admit to stepdown -initially requiring 10L HFNC>6L. When I walked into room she had pulled oxygen off and was sleeping and oxygen was 90-91% on RA. Placed her back on 4L HFNC. Wean as tolerated -CTA with no PE, but multifocal pneumonia with sepsis criteria -continue vanc and will do cefepime and azithromycin to cover for MRSA and pseudomonas since just left jail and drug abuse.  -check urinary antigens -BC pending -albuterol inhaler PRN -TB quant pending -check/trend PCT -covid/flu/rsv negative -Uds +cocaine ? contributing to hemoptysis

## 2022-04-02 NOTE — Plan of Care (Signed)
[facility] -> [name]  [name] (DOB (1990/06/09)  Requesting Physician: Georgina Snell, MD  [facility] for new hemoptysis and shortness of breath with worsening cough.  She is just released from prison yesterday and started having hemoptysis this morning about half a cup worth.  Currently not on anticoagulation antiplatelets.  She had some chest discomfort with coughing and breathing and had some subjective fevers.  Of note she smoked some crack cocaine this morning and snorted heroin and was found to be hypoxic upon admission.  Further workup revealed multifocal pneumonia.  COVID and RSV are negative.  Given that she had just been released from prison TB needs to be ruled out QuantiFERON gold is pending.  She is extremely hypoxic on room air and is now on 10 L high flow salter.  She was extremely tachycardic and tachypneic and was given sepsis fluid protocol as of 2 L and is now on maintenance IV fluids and received broad-spectrum antibiotics with IV Zosyn and vancomycin.  Chest x-ray showed bilateral infiltrates and a CT PE is very was done showed no evidence of PE or active lung hemorrhage.  WBC was elevated at 15.1.  UDS is pending and blood cultures are pending as well.  She was accepted to the [facility] step[facility] unit as an inpatient given her severity of illness.

## 2022-04-02 NOTE — ED Notes (Signed)
VBG completed

## 2022-04-02 NOTE — ED Provider Notes (Signed)
[facility] Provider Note   CSN: [id number] Arrival date & time: 04/02/22  1305     History  Chief Complaint  Patient presents with   Cough    [name] is a 32 y.o. female.  With PMH of polysubstance abuse, bipolar disorder, atrial septal defect who presents with worsening cough, shortness of breath and new hemoptysis over the past few days.  She was just released from prison yesterday.  She started having hemoptysis today about 1/2 cup worth.  She is not on any anticoagulation or antiplatelets.  She has had some chest pain worse with coughing and breathing as well as some subjective fevers.  No hematemesis or melena.  She has had no syncopal episodes.  No leg pain or swelling.  No history of PE or DVT.  She last used drugs today.  She will intermittently smoke crack cocaine which she did do today as well as snorted heroin.  Denies any alcohol use.   Cough      Home Medications Prior to Admission medications   Medication Sig Start Date End Date Taking? Authorizing Provider  FLUoxetine (PROZAC) 20 MG capsule Take 1 capsule (20 mg total) by mouth daily. 08/29/21  Yes Eulis Canner E, NP  gabapentin (NEURONTIN) 400 MG capsule Take 1 capsule (400 mg total) by mouth 3 (three) times daily. 08/29/21  Yes Eulis Canner E, NP  QUEtiapine (SEROQUEL) 100 MG tablet TAKE 1 TABLET BY MOUTH EVERYDAY AT BEDTIME 11/16/21  Yes Eulis Canner E, NP  QUEtiapine (SEROQUEL) 200 MG tablet TAKE 1 TABLET BY MOUTH AT BEDTIME. 11/16/21  Yes Salley Slaughter, NP      Allergies    Patient has no known allergies.    Review of Systems   Review of Systems  Respiratory:  Positive for cough.     Physical Exam Updated Vital Signs BP (!) 143/69 (BP Location: Right Arm)   Pulse 77   Temp 98.6 F (37 C) (Oral)   Resp 17   Ht 5' 10.5" (1.791 m)   Wt 68.2 kg   LMP 04/02/2022 (Exact Date)   SpO2 100%   BMI 21.27 kg/m  Physical  Exam Constitutional: Alert and oriented.  Ill-appearing female, slightly intoxicated on exam Eyes: Conjunctivae are normal. [id number] ENT      Head: Normocephalic and atraumatic.      Nose: No congestion.      Mouth/Throat: Mucous membranes are moist.      Neck: No stridor. Cardiovascular: S1, S2, tachycardic, regular rhythm.Warm and well perfused. Respiratory: Tachypneic, fine crackles bilaterally, O2 sat 78% on room air Gastrointestinal: Soft and nontender.  Musculoskeletal: Normal range of motion in all extremities.      Right lower leg: No tenderness or edema.      Left lower leg: No tenderness or edema. Neurologic: Mild slurred speech.  Moving moving all extremities equally.  Sensation grossly intact.  No facial droop.  No gross focal neurologic deficits are appreciated. Skin: Skin is warm, dry and intact. No rash noted. Psychiatric: Mood and affect are normal. Speech and behavior are normal.  ED Results / Procedures / Treatments   Labs (all labs ordered are listed, but only abnormal results are displayed) Labs Reviewed  COMPREHENSIVE METABOLIC PANEL - Abnormal; Notable for the following components:      Result Value   Glucose, Bld 106 (*)    AST 10 (*)    All other components within normal limits  CBC WITH DIFFERENTIAL/PLATELET -  Abnormal; Notable for the following components:   WBC 15.1 (*)    RBC 5.41 (*)    Hemoglobin 16.0 (*)    HCT 47.2 (*)    Neutro Abs 12.4 (*)    Monocytes Absolute 1.5 (*)    Abs Immature Granulocytes 0.09 (*)    All other components within normal limits  BRAIN NATRIURETIC PEPTIDE - Abnormal; Notable for the following components:   B Natriuretic Peptide 164.0 (*)    All other components within normal limits  RAPID URINE DRUG SCREEN, HOSP PERFORMED - Abnormal; Notable for the following components:   Cocaine POSITIVE (*)    All other components within normal limits  URINALYSIS, ROUTINE W REFLEX MICROSCOPIC - Abnormal; Notable for the following  components:   APPearance HAZY (*)    Specific Gravity, Urine 1.032 (*)    Hgb urine dipstick LARGE (*)    Protein, ur 30 (*)    All other components within normal limits  I-STAT VENOUS BLOOD GAS, ED - Abnormal; Notable for the following components:   pCO2, Ven 40.2 (*)    pO2, Ven 53 (*)    HCT 47.0 (*)    Hemoglobin 16.0 (*)    All other components within normal limits  RESP PANEL BY RT-PCR (RSV, FLU A&B, COVID)  RVPGX2  MRSA NEXT GEN BY PCR, NASAL  CULTURE, BLOOD (ROUTINE X 2)  CULTURE, BLOOD (ROUTINE X 2)  ETHANOL  HCG, QUANTITATIVE, PREGNANCY  PROTIME-INR  APTT  QUANTIFERON-TB GOLD PLUS  PROCALCITONIN  PROCALCITONIN  TROPONIN I (HIGH SENSITIVITY)  TROPONIN I (HIGH SENSITIVITY)    EKG EKG Interpretation  Date/Time:  Thursday April 02 2022 13:22:18 EST Ventricular Rate:  136 PR Interval:  128 QRS Duration: 72 QT Interval:  364 QTC Calculation: 547 R Axis:   95 Text Interpretation: Sinus tachycardia Rightward axis Cannot rule out Anterior infarct (cited on or before 03-Aug-2020) Abnormal ECG When compared with ECG of 03-Aug-2020 12:08, Vent. rate has increased BY  59 BPM ST now depressed in Inferior leads ST now depressed in Anterolateral leads Nonspecific T wave abnormality no longer evident in Anterior leads T wave inversion now evident in Lateral leads Confirmed by Georgina Snell [id number]) on 04/02/2022 1:51:54 PM  Radiology CT Angio Chest PE W and/or Wo Contrast  Result Date: 04/02/2022 CLINICAL DATA:  Hypoxia, hemoptysis, cough EXAM: CT ANGIOGRAPHY CHEST WITH CONTRAST TECHNIQUE: Multidetector CT imaging of the chest was performed using the standard protocol during bolus administration of intravenous contrast. Multiplanar CT image reconstructions and MIPs were obtained to evaluate the vascular anatomy. RADIATION DOSE REDUCTION: This exam was performed according to the departmental dose-optimization program which includes automated exposure control, adjustment of the mA  and/or kV according to patient size and/or use of iterative reconstruction technique. CONTRAST:  15m OMNIPAQUE IOHEXOL 350 MG/ML SOLN COMPARISON:  Same day chest x-ray FINDINGS: Cardiovascular: Satisfactory opacification of the pulmonary arteries to the segmental level. No evidence of pulmonary embolism. Thoracic aorta is normal in course and caliber. Normal heart size. No pericardial effusion. Mediastinum/Nodes: Mildly prominent mediastinal and bilateral hilar lymph nodes are likely reactive. Reference node includes 11 mm precarinal node (series 4, image 57). No axillary lymphadenopathy. Thyroid, trachea, and esophagus within normal limits. Lungs/Pleura: Dense airspace consolidations with air bronchograms in the right middle lobe, lingula, and posterior basal segment of the right lower lobe. Additional patchy airspace consolidations within the left lower lobe. Additional areas of ground-glass opacity and tree-in-bud nodularity within the upper lobes bilaterally. Diffuse bronchial wall thickening.  No pleural effusion or pneumothorax. Upper Abdomen: No acute abnormality. Musculoskeletal: Prior median sternotomy. No acute osseous abnormality. No chest wall abnormality. Review of the MIP images confirms the above findings. IMPRESSION: 1. No evidence of pulmonary embolism. 2. Findings most consistent with multifocal pneumonia, most severely involving the right middle lobe, lingula, and right lower lobe. 3. Mildly prominent mediastinal and bilateral hilar lymph nodes, likely reactive. Electronically Signed   By: Davina Poke D.O.   On: 04/02/2022 15:41   DG Chest Portable 1 View  Result Date: 04/02/2022 CLINICAL DATA:  hypoxia, tachycardia EXAM: PORTABLE CHEST 1 VIEW COMPARISON:  Radiograph 08/06/2017 FINDINGS: Prior median sternotomy and clip in the anterior mediastinum. Unchanged cardiomediastinal silhouette. There are patchy airspace disease in the lower lungs. No pleural effusion or evidence of pneumothorax.  No acute osseous abnormality. IMPRESSION: Patchy airspace disease in the lower lungs consistent with multifocal pneumonia. Recommend imaging follow-up to resolution. Electronically Signed   By: Maurine Simmering M.D.   On: 04/02/2022 13:58    Procedures .Critical Care  Performed by: Elgie Congo, MD Authorized by: Elgie Congo, MD   Critical care provider statement:    Critical care time (minutes):  74   Critical care was necessary to treat or prevent imminent or life-threatening deterioration of the following conditions:  Respiratory failure and sepsis   Critical care was time spent personally by me on the following activities:  Development of treatment plan with patient or surrogate, discussions with consultants, evaluation of patient's response to treatment, examination of patient, ordering and review of laboratory studies, ordering and review of radiographic studies, ordering and performing treatments and interventions, pulse oximetry, re-evaluation of patient's condition and obtaining history from patient or surrogate   Care discussed with: admitting provider       Medications Ordered in ED Medications  ondansetron (ZOFRAN) injection 4 mg (has no administration in time range)  dicyclomine (BENTYL) capsule 20 mg (has no administration in time range)  hydrOXYzine (ATARAX) tablet 25 mg (has no administration in time range)  loperamide (IMODIUM) capsule 2-4 mg (has no administration in time range)  methocarbamol (ROBAXIN) tablet 500 mg (has no administration in time range)  Chlorhexidine Gluconate Cloth 2 % PADS 6 each (6 each Topical Given 04/02/22 1749)  Oral care mouth rinse (has no administration in time range)  vancomycin (VANCOREADY) IVPB 750 mg/150 mL (has no administration in time range)  lactated ringers bolus 1,000 mL (0 mLs Intravenous Stopped 04/02/22 1518)  piperacillin-tazobactam (ZOSYN) IVPB 3.375 g (0 g Intravenous Stopped 04/02/22 1617)  dexamethasone (DECADRON)  injection 10 mg (10 mg Intravenous Given 04/02/22 1411)  vancomycin (VANCOCIN) IVPB 1000 mg/200 mL premix (0 mg Intravenous Stopped 04/02/22 1518)  sodium chloride 0.9 % bolus 1,000 mL (1,000 mLs Intravenous New Bag/Given 04/02/22 1512)  acetaminophen (TYLENOL) tablet 1,000 mg (1,000 mg Oral Given 04/02/22 1511)  iohexol (OMNIPAQUE) 350 MG/ML injection 100 mL (75 mLs Intravenous Contrast Given 04/02/22 1526)  0.9 %  sodium chloride infusion ( Intravenous Infusion Verify 04/02/22 1800)    ED Course/ Medical Decision Making/ A&P Clinical Course as of 04/02/22 2019  Thu Apr 02, 2022  1427 10 L 58% on high flow [place] [VB]  1557 Heart rate in the low 100s, satting 96% on high flow nasal cannula 10 L, stable blood pressure, significant improvement with symptom control. [VB]    Clinical Course User Index [VB] Elgie Congo, MD  Medical Decision Making WYLLA MEDLEY is a 32 y.o. female.  With PMH of polysubstance abuse, bipolar disorder, atrial septal defect who presents with worsening cough, shortness of breath and new hemoptysis over the past few days.   Patient presents with hypoxic respiratory failure satting 78% on room air tachypneic and tachycardic to the 140s with stable BP 122/93.  Based on her presentation and history, there could be multiple underlying causes of patient's presentation today including but not limited to hemorrhagic alveolitis from crack cocaine use, pneumonia, TB, PE among multiple other etiologies.   Chest x-ray obtained which I personally reviewed showing bilateral infiltrates concerning for multifocal pneumonia.  This was followed up with CTA PE study which showed no evidence of PE or active hemorrhagic lung disease.  Consistent with multifocal pneumonia.   Labs remarkable for leukocytosis 15.1 with left shift, hemoglobin 16, normal PT and PTT, no concern for coagulopathy.  VBG unremarkable.  BNP slightly elevated 164.  Troponin 7 and  reassuring.  Quantiferon gold sent and pending. RVP negative.  She was started on IV fluids, broad-spectrum antibiotics with vancomycin and Zosyn, given IV steroids and started on high flow nasal cannula 10 L at 58% with improvement.  Admitted to hospitalist under stepdown status for continued management.  Amount and/or Complexity of Data Reviewed Labs: ordered. Radiology: ordered.  Risk OTC drugs. Prescription drug management. Decision regarding hospitalization.    Final Clinical Impression(s) / ED Diagnoses Final diagnoses:  Multifocal pneumonia  Acute respiratory failure with hypoxia [facility])    Rx / DC Orders ED Discharge Orders     None         Elgie Congo, MD 04/02/22 2019

## 2022-04-02 NOTE — Assessment & Plan Note (Signed)
Per chart reported she coughed up about 1/2 cup of blood. She didn't initially remember this, but then stated it happened x1.  No more episodes With history of jail time, quant TB pending Continue airborne/contact precautions Only in jail x3 days ? If more cocaine related  SCDs for now, hgb stable

## 2022-04-02 NOTE — Assessment & Plan Note (Signed)
-  cocaine + in UDS and admits to smoking crack cocaine this AM -heroine abuse. History of suboxone in 2023, but continued to use heroine. No f/u after 09/2021  -reports snorting heroine, but UDS negative for opiates  -SW consult

## 2022-04-02 NOTE — ED Triage Notes (Addendum)
Pt reports cough and congestion x 2 days. Pt reports she did see blood in her sputum. Denies fevers. Pt reports heroine and cocaine use today.

## 2022-04-02 NOTE — Progress Notes (Signed)
Patient was placed a on 10L [place] to maintain a saturation 93%. She is tolerating well at this time.

## 2022-04-02 NOTE — Progress Notes (Signed)
Pharmacy Antibiotic Note  [name] is a 32 y.o. female for which pharmacy has been consulted for vancomycin dosing for pneumonia.  Patient with a history of polysubstance abuse, bipolar disorder, atrial septal defect. Patient presenting with worsening cough, SOB and hemoptysis.  SCr 0.71 WBC 15.1; T 99; HR 107; RR 16 COVID neg / flu neg  Plan: Zosyn per MD Vancomycin 1000 mg once then 750 mg q8hr (eAUC 487) unless change in renal function Trend WBC, Fever, Renal function F/u cultures, clinical course, WBC De-escalate when able Levels at steady state F/u MRSA PCR     Temp (24hrs), Avg:99.7 F (37.6 C), Min:99.7 F (37.6 C), Max:99.7 F (37.6 C)  No results for input(s): "WBC", "CREATININE", "LATICACIDVEN", "VANCOTROUGH", "VANCOPEAK", "VANCORANDOM", "GENTTROUGH", "GENTPEAK", "GENTRANDOM", "TOBRATROUGH", "TOBRAPEAK", "TOBRARND", "AMIKACINPEAK", "AMIKACINTROU", "AMIKACIN" in the last 168 hours.  CrCl cannot be calculated (Patient's most recent lab result is older than the maximum 21 days allowed.).    No Known Allergies  Microbiology results: Pending  Thank you for allowing pharmacy to be a part of this patient's care.  Lorelei Pont, PharmD, BCPS 04/02/2022 1:56 PM ED Clinical Pharmacist -  [phone]
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
## 2022-04-02 NOTE — H&P (Signed)
History and Physical    Patient: [name] [id number] DOB: 15-Jan-1991 DOA: 04/02/2022 DOS: the patient was seen and examined on 04/02/2022 PCP: Patient, No Pcp Per  Patient coming from:  [facility]  - lives with her landlord    Chief Complaint: hemoptysis and shortness of breath   HPI: [name] is a 32 y.o. female with medical history significant of polysubstance abuse (cocaine/tobacco/heroine), ADD, mood disorder, ASD (corrected) who was recently released from prison yesterday who presented to ED with hemoptysis and worsening shortness of breath and cough. She states she smoked crack cocaine this AM and snorted heroin and started to have shortness of breath and couldn't breath. She doesn't immediately recall coughing up blood, but when asked again she said, "yah,  just one time." She states she couldn't breath and thought she was dying so her friends took her to the ED. History is difficult as she is tangential in her speech at times and somewhat sleepy. She states she was only in jail x 3 days after she had a mental break down and burned herself. No SI/HI.   She smoked crack cocaine this morning and snorted heroin.  She smokes 1/2 PPD or more of cigarettes.     Denies any fever/chills, vision changes/headaches, chest pain or palpitations,  abdominal pain, N/V/D, dysuria or leg swelling.    ER Course:  vitals: afebrile, bp: 122/93, HR: 142, RR: 24, oxygen: 78%RA Pertinent labs: WBC: 15.1, hgb: 16, bnp: 164, UDS: +cocaine,  CXR: Patchy airspace disease in the lower lungs consistent with multifocal pneumonia. Recommend imaging follow-up to resolution. CTA chest: No PE. Findings most consistent with multifocal pneumonia, most severely involving the right middle lobe, lingula and RLL. Mildly prominent mediastinal and bilateral hilar lymph nodes, likely reactive. In ED: tested for TB and put on airborne/contact precautions. Vanc/zosyn started. Given 2L bolus, decadron injection TRH  asked to admit.     Review of Systems: As mentioned in the history of present illness. All other systems reviewed and are negative. Past Medical History:  Diagnosis Date   Anxiety    Atrial septal defect    corrected 1999   Bipolar disorder ([place])    Depression    Heart murmur    Mental health problem    Self-mutilation    Substance abuse ([place])    Past Surgical History:  Procedure Laterality Date   ASD REPAIR     Social History:  reports that she has been smoking cigarettes. She has a 14.00 pack-year smoking history. She has never used smokeless tobacco. She reports current alcohol use.  Frequency: 3.00 times per week. Drugs: Marijuana and Cocaine.  No Known Allergies  Family History  Problem Relation Age of Onset   Cancer Mother        breast - both   Arthritis Maternal Grandmother        rheumatoid   Cancer Maternal Grandfather        brain   Alzheimer's disease Paternal Grandmother    Mental illness Neg Hx    Drug abuse Neg Hx    Suicidality Neg Hx     Prior to Admission medications   Medication Sig Start Date End Date Taking? Authorizing Provider  FLUoxetine (PROZAC) 20 MG capsule Take 1 capsule (20 mg total) by mouth daily. 08/29/21  Yes Eulis Canner E, NP  gabapentin (NEURONTIN) 400 MG capsule Take 1 capsule (400 mg total) by mouth 3 (three) times daily. 08/29/21  Yes Salley Slaughter, NP  QUEtiapine (SEROQUEL) 100 MG tablet TAKE 1 TABLET BY MOUTH EVERYDAY AT BEDTIME 11/16/21  Yes Eulis Canner E, NP  QUEtiapine (SEROQUEL) 200 MG tablet TAKE 1 TABLET BY MOUTH AT BEDTIME. 11/16/21  Yes Salley Slaughter, NP    Physical Exam: Vitals:   04/02/22 1748 04/02/22 1800 04/02/22 2000 04/02/22 2035  BP:  (!) 143/69 139/67   Pulse:  77 82 91  Resp:  17 11 (!) 21  Temp: 98.6 F (37 C)  98.7 F (37.1 C)   TempSrc: Oral  Oral   SpO2:  100% 91% 100%  Weight: 68.2 kg     Height: 5' 10.5" (1.791 m)      General:  Appears calm and comfortable and is in NAD.  Disheveled, thin.  Eyes:  PERRL, EOMI, normal lids, iris ENT:  grossly normal hearing, lips & tongue, mmm; appropriate dentition Neck:  no LAD, masses or thyromegaly; no carotid bruits Cardiovascular:  RRR, no m/r/g. No LE edema.  Respiratory:  crackles and decreased air movement in RLL.   Normal respiratory effort. Abdomen:  soft, NT, ND, NABS Back:   normal alignment, no CVAT Skin:  no rash or induration seen on limited exam. Large scabs on bilateral forearms and legs. Scratches all over her lower legs.  Musculoskeletal:  grossly normal tone BUE/BLE, good ROM, no bony abnormality Lower extremity:  No LE edema.  Limited foot exam with no ulcerations.  2+ distal pulses. Psychiatric:  grossly normal mood and affect, speech fluent and appropriate, AOx3 Neurologic:  CN 2-12 grossly intact, moves all extremities in coordinated fashion, sensation intact   Radiological Exams on Admission: Independently reviewed - see discussion in A/P where applicable  CT Angio Chest PE W and/or Wo Contrast  Result Date: 04/02/2022 CLINICAL DATA:  Hypoxia, hemoptysis, cough EXAM: CT ANGIOGRAPHY CHEST WITH CONTRAST TECHNIQUE: Multidetector CT imaging of the chest was performed using the standard protocol during bolus administration of intravenous contrast. Multiplanar CT image reconstructions and MIPs were obtained to evaluate the vascular anatomy. RADIATION DOSE REDUCTION: This exam was performed according to the departmental dose-optimization program which includes automated exposure control, adjustment of the mA and/or kV according to patient size and/or use of iterative reconstruction technique. CONTRAST:  84m OMNIPAQUE IOHEXOL 350 MG/ML SOLN COMPARISON:  Same day chest x-ray FINDINGS: Cardiovascular: Satisfactory opacification of the pulmonary arteries to the segmental level. No evidence of pulmonary embolism. Thoracic aorta is normal in course and caliber. Normal heart size. No pericardial effusion.  Mediastinum/Nodes: Mildly prominent mediastinal and bilateral hilar lymph nodes are likely reactive. Reference node includes 11 mm precarinal node (series 4, image 57). No axillary lymphadenopathy. Thyroid, trachea, and esophagus within normal limits. Lungs/Pleura: Dense airspace consolidations with air bronchograms in the right middle lobe, lingula, and posterior basal segment of the right lower lobe. Additional patchy airspace consolidations within the left lower lobe. Additional areas of ground-glass opacity and tree-in-bud nodularity within the upper lobes bilaterally. Diffuse bronchial wall thickening. No pleural effusion or pneumothorax. Upper Abdomen: No acute abnormality. Musculoskeletal: Prior median sternotomy. No acute osseous abnormality. No chest wall abnormality. Review of the MIP images confirms the above findings. IMPRESSION: 1. No evidence of pulmonary embolism. 2. Findings most consistent with multifocal pneumonia, most severely involving the right middle lobe, lingula, and right lower lobe. 3. Mildly prominent mediastinal and bilateral hilar lymph nodes, likely reactive. Electronically Signed   By: NDavina PokeD.O.   On: 04/02/2022 15:41   DG Chest Portable 1 View  Result Date: 04/02/2022  CLINICAL DATA:  hypoxia, tachycardia EXAM: PORTABLE CHEST 1 VIEW COMPARISON:  Radiograph 08/06/2017 FINDINGS: Prior median sternotomy and clip in the anterior mediastinum. Unchanged cardiomediastinal silhouette. There are patchy airspace disease in the lower lungs. No pleural effusion or evidence of pneumothorax. No acute osseous abnormality. IMPRESSION: Patchy airspace disease in the lower lungs consistent with multifocal pneumonia. Recommend imaging follow-up to resolution. Electronically Signed   By: Maurine Simmering M.D.   On: 04/02/2022 13:58    EKG: Independently reviewed.  Sinus tachycardia with rate 136; nonspecific ST changes with no evidence of acute ischemia. Some T wave depression new.    Labs  on Admission: I have personally reviewed the available labs and imaging studies at the time of the admission.  Pertinent labs:   WBC: 15.1,  hgb: 16,  bnp: 164,  UDS: +cocaine,  Assessment and Plan: Principal Problem:   Acute respiratory failure with hypoxia due to multifocal pneumonia and drug abuse Active Problems:   Sepsis due to multifocal pneumonia   Hemoptysis   Hematuria   Polysubstance abuse (HCC)   Substance induced mood disorder (HCC)   Multifocal pneumonia    Assessment and Plan: * Acute respiratory failure with hypoxia due to multifocal pneumonia and drug abuse 32 year old presenting with 1 day history of acute onset of shortness of breath found to be septic and hypoxic to 78% on room air due to multifocal pneumonia in setting of polysubstance abuse -admit to stepdown -initially requiring 10L HFNC>6L. When I walked into room she had pulled oxygen off and was sleeping and oxygen was 90-91% on RA. Placed her back on 4L HFNC. Wean as tolerated -CTA with no PE, but multifocal pneumonia with sepsis criteria -continue vanc and will do cefepime and azithromycin to cover for MRSA and pseudomonas since just left jail and drug abuse.  -check urinary antigens -BC pending -albuterol inhaler PRN -TB quant pending -check/trend PCT -covid/flu/rsv negative -Uds +cocaine ? contributing to hemoptysis   Sepsis due to multifocal pneumonia Sepsis criteria met and secondary to multifocal pneumonia Received IVF in ED, will continue maintenance dose HR/RR have normalized Continue to trend WBC Continue abx per above   Hemoptysis Per chart reported she coughed up about 1/2 cup of blood. She didn't initially remember this, but then stated it happened x1.  No more episodes With history of jail time, quant TB pending Continue airborne/contact precautions Only in jail x3 days ? If more cocaine related  SCDs for now, hgb stable   Hematuria Repeat UA Check to make sure not on period    Polysubstance abuse (HCC) -cocaine + in UDS and admits to smoking crack cocaine this AM -heroine abuse. History of suboxone in 2023, but continued to use heroine. No f/u after 09/2021  -reports snorting heroine, but UDS negative for opiates  -SW consult   Substance induced mood disorder ([place]) -bipolar d/o -continue prozac and seroquel at night -states she had a mental breakdown and burned herself prior to going to jail x 3 days. Cuts all over lower legs, but denies cutting herself.  -No Si/HI  -psychiatry consult     Advance Care Planning:   Code Status: Full Code   Consults: SW and psychiatry   DVT Prophylaxis: SCDs  Family Communication: none   Severity of Illness: The appropriate patient status for this patient is INPATIENT. Inpatient status is judged to be reasonable and necessary in order to provide the required intensity of service to ensure the patient's safety. The patient's presenting symptoms,  physical exam findings, and initial radiographic and laboratory data in the context of their chronic comorbidities is felt to place them at high risk for further clinical deterioration. Furthermore, it is not anticipated that the patient will be medically stable for discharge from the hospital within 2 midnights of admission.   * I certify that at the point of admission it is my clinical judgment that the patient will require inpatient hospital care spanning beyond 2 midnights from the point of admission due to high intensity of service, high risk for further deterioration and high frequency of surveillance required.*  Author: Orma Flaming, MD 04/02/2022 9:20 PM  For on call review [email or web address].

## 2022-04-02 NOTE — Progress Notes (Signed)
Patient was placed on 2L [place] because her oxygen saturation was 78% on room air.

## 2022-04-03 ENCOUNTER — Other Ambulatory Visit (HOSPITAL_COMMUNITY): Payer: Self-pay

## 2022-04-03 DIAGNOSIS — A419 Sepsis, unspecified organism: Secondary | ICD-10-CM | POA: Diagnosis not present

## 2022-04-03 DIAGNOSIS — J9601 Acute respiratory failure with hypoxia: Secondary | ICD-10-CM | POA: Diagnosis not present

## 2022-04-03 DIAGNOSIS — R042 Hemoptysis: Secondary | ICD-10-CM

## 2022-04-03 DIAGNOSIS — F1914 Other psychoactive substance abuse with psychoactive substance-induced mood disorder: Secondary | ICD-10-CM

## 2022-04-03 DIAGNOSIS — F1410 Cocaine abuse, uncomplicated: Secondary | ICD-10-CM | POA: Diagnosis not present

## 2022-04-03 DIAGNOSIS — F1910 Other psychoactive substance abuse, uncomplicated: Secondary | ICD-10-CM

## 2022-04-03 DIAGNOSIS — R319 Hematuria, unspecified: Secondary | ICD-10-CM

## 2022-04-03 DIAGNOSIS — F17200 Nicotine dependence, unspecified, uncomplicated: Secondary | ICD-10-CM

## 2022-04-03 LAB — CBC
HCT: 38.6 % (ref 36.0–46.0)
Hemoglobin: 12.2 g/dL (ref 12.0–15.0)
MCH: 29.2 pg (ref 26.0–34.0)
MCHC: 31.6 g/dL (ref 30.0–36.0)
MCV: 92.3 fL (ref 80.0–100.0)
Platelets: 211 10*3/uL (ref 150–400)
RBC: 4.18 MIL/uL (ref 3.87–5.11)
RDW: 15.3 % (ref 11.5–15.5)
WBC: 14.1 10*3/uL — ABNORMAL HIGH (ref 4.0–10.5)
nRBC: 0 % (ref 0.0–0.2)

## 2022-04-03 LAB — BASIC METABOLIC PANEL
Anion gap: 5 (ref 5–15)
BUN: 11 mg/dL (ref 6–20)
CO2: 26 mmol/L (ref 22–32)
Calcium: 8 mg/dL — ABNORMAL LOW (ref 8.9–10.3)
Chloride: 104 mmol/L (ref 98–111)
Creatinine, Ser: 0.68 mg/dL (ref 0.44–1.00)
GFR, Estimated: >60 mL/min (ref >60)
Glucose, Bld: 98 mg/dL (ref 70–99)
Potassium: 3.7 mmol/L (ref 3.5–5.1)
Sodium: 135 mmol/L (ref 135–145)

## 2022-04-03 LAB — EXPECTORATED SPUTUM ASSESSMENT W GRAM STAIN, RFLX TO RESP C

## 2022-04-03 LAB — LACTIC ACID, PLASMA
Lactic Acid, Venous: 2.1 mmol/L (ref 0.5–1.9)
Lactic Acid, Venous: 2.7 mmol/L (ref 0.5–1.9)

## 2022-04-03 LAB — STREP PNEUMONIAE URINARY ANTIGEN: Strep Pneumo Urinary Antigen: NEGATIVE

## 2022-04-03 LAB — HIV ANTIBODY (ROUTINE TESTING W REFLEX): HIV Screen 4th Generation wRfx: NONREACTIVE

## 2022-04-03 LAB — PROCALCITONIN: Procalcitonin: 2.37 ng/mL

## 2022-04-03 MED ORDER — ALBUTEROL SULFATE HFA 108 (90 BASE) MCG/ACT IN AERS: 2.0000 | INHALATION_SPRAY | RESPIRATORY_TRACT | Status: DC | PRN

## 2022-04-03 MED ORDER — LACTATED RINGERS IV BOLUS
1000.0000 mL | Freq: Once | INTRAVENOUS | Status: AC
  Administered 2022-04-03: 1000 mL via INTRAVENOUS

## 2022-04-03 MED ORDER — ALBUTEROL SULFATE HFA 108 (90 BASE) MCG/ACT IN AERS
2.0000 | INHALATION_SPRAY | RESPIRATORY_TRACT | 0 refills | Status: AC | PRN
  Filled 2022-04-03: qty 6.7, 17d supply, fill #0

## 2022-04-03 MED ORDER — AMOXICILLIN-POT CLAVULANATE 875-125 MG PO TABS
1.0000 | ORAL_TABLET | Freq: Two times a day (BID) | ORAL | 0 refills | Status: AC
  Filled 2022-04-03: qty 14, 7d supply, fill #0

## 2022-04-03 MED ORDER — ALBUTEROL SULFATE (2.5 MG/3ML) 0.083% IN NEBU: 2.5000 mg | INHALATION_SOLUTION | RESPIRATORY_TRACT | Status: DC | PRN

## 2022-04-03 MED ORDER — QUETIAPINE FUMARATE 100 MG PO TABS: 200.0000 mg | ORAL_TABLET | Freq: Every day | ORAL | Status: DC

## 2022-04-03 MED ORDER — DOXYCYCLINE HYCLATE 100 MG PO CAPS
100.0000 mg | ORAL_CAPSULE | Freq: Two times a day (BID) | ORAL | 0 refills | Status: AC
  Filled 2022-04-03: qty 14, 7d supply, fill #0

## 2022-04-03 NOTE — Progress Notes (Incomplete)
PROGRESS NOTE   [name]  [id number] DOB: 08/14/1990 DOA: 04/02/2022 PCP: Patient, No Pcp Per   Date of Service: the patient was seen and examined on 04/03/2022  Brief Narrative:  32 y.o. female with medical history significant for nicotine dependence, polysubstance abuse (cocaine/tobacco/heroine), ADD, mood disorder recently released from prison who presented to ED with hemoptysis and worsening shortness of breath and cough after smoking crack cocaine and snorting heroin.    Upon evaluation in the emergency department chest x-ray revealed multifocal infiltrates concerning for pneumonia.  CT angiogram of the chest was additionally performed consistent with multifocal pneumonia worse in the right middle lobe, lingula and right lower lobe.  Due to concerns for tuberculosis patient was placed on airborne precautions.  Vancomycin and Zosyn were initiated.  Hospitalist group was then called to assess the patient for admission the hospital.     Assessment and Plan: * Acute respiratory failure with hypoxia due to multifocal pneumonia and drug abuse 32 year old presenting with 1 day history of acute onset of shortness of breath found to be septic and hypoxic to 78% on room air due to multifocal pneumonia in setting of polysubstance abuse -admit to stepdown -initially requiring 10L HFNC>6L. When I walked into room she had pulled oxygen off and was sleeping and oxygen was 90-91% on RA. Placed her back on 4L HFNC. Wean as tolerated -CTA with no PE, but multifocal pneumonia with sepsis criteria -continue vanc and will do cefepime and azithromycin to cover for MRSA and pseudomonas since just left jail and drug abuse.  -check urinary antigens -BC pending -albuterol inhaler PRN -TB quant pending -check/trend PCT -covid/flu/rsv negative -Uds +cocaine ? contributing to hemoptysis   Sepsis due to multifocal pneumonia Sepsis criteria met and secondary to multifocal pneumonia Received IVF in ED,  will continue maintenance dose HR/RR have normalized Continue to trend WBC Continue abx per above   Hemoptysis Per chart reported she coughed up about 1/2 cup of blood. She didn't initially remember this, but then stated it happened x1.  No more episodes With history of jail time, quant TB pending Continue airborne/contact precautions Only in jail x3 days ? If more cocaine related  SCDs for now, hgb stable   Hematuria Repeat UA Check to make sure not on period   Polysubstance abuse (HCC) -cocaine + in UDS and admits to smoking crack cocaine this AM -heroine abuse. History of suboxone in 2023, but continued to use heroine. No f/u after 09/2021  -reports snorting heroine, but UDS negative for opiates  -SW consult   Substance induced mood disorder ([place]) -bipolar d/o -continue prozac and seroquel at night -states she had a mental breakdown and burned herself prior to going to jail x 3 days. Cuts all over lower legs, but denies cutting herself.  -No Si/HI  -psychiatry consult         Subjective:  ***  Physical Exam:  Vitals:   04/03/22 0440 04/03/22 0500 04/03/22 0600 04/03/22 0700  BP:  (!) 109/51 125/65 (!) 154/97  Pulse:  71 63 90  Resp:  15 16 (!) 21  Temp: 98.3 F (36.8 C)     TempSrc: Oral     SpO2:  97% 97% 96%  Weight:      Height:        *** Constitutional: Awake alert and oriented x3, no associated distress.   Skin: no rashes, no lesions, good skin turgor noted. Eyes: Pupils are equally reactive to light.  No evidence  of scleral icterus or conjunctival pallor.  ENMT: Moist mucous membranes noted.  Posterior pharynx clear of any exudate or lesions.   Respiratory: clear to auscultation bilaterally, no wheezing, no crackles. Normal respiratory effort. No accessory muscle use.  Cardiovascular: Regular rate and rhythm, no murmurs / rubs / gallops. No extremity edema. 2+ pedal pulses. No carotid bruits.  Abdomen: Abdomen is soft and nontender.  No evidence  of intra-abdominal masses.  Positive bowel sounds noted in all quadrants.   Musculoskeletal: No joint deformity upper and lower extremities. Good ROM, no contractures. Normal muscle tone.    Data Reviewed:  I have personally reviewed and interpreted labs, imaging.  Significant findings are ***  CBC: Recent Labs  Lab 04/02/22 1346 04/02/22 1403 04/03/22 0259  WBC 15.1*  --  14.1*  NEUTROABS 12.4*  --   --   HGB 16.0* 16.0* 12.2  HCT 47.2* 47.0* 38.6  MCV 87.2  --  92.3  PLT 280  --  123456   Basic Metabolic Panel: Recent Labs  Lab 04/02/22 1346 04/02/22 1403 04/03/22 0259  NA 135 135 135  K 3.5 3.5 3.7  CL 99  --  104  CO2 25  --  26  GLUCOSE 106*  --  98  BUN 12  --  11  CREATININE 0.71  --  0.68  CALCIUM 9.6  --  8.0*   GFR: Estimated Creatinine Clearance: 108.7 mL/min (by C-G formula based on SCr of 0.68 mg/dL). Liver Function Tests: Recent Labs  Lab 04/02/22 1346  AST 10*  ALT 14  ALKPHOS 56  BILITOT 0.5  PROT 7.8  ALBUMIN 4.0    Coagulation Profile: Recent Labs  Lab 04/02/22 1346  INR 1.2     EKG/Telemetry: Personally reviewed.  Rhythm is *** with heart rate of ***.  No dynamic ST segment changes appreciated.   Code Status:  {Palliative Code status:23503}.  Code status decision has been confirmed with: *** Family Communication: ***    Severity of Illness:  {Observation/Inpatient:21159}  Time spent:  *** minutes  Author:  Vernelle Emerald MD  04/03/2022 8:10 AM

## 2022-04-03 NOTE — Discharge Instructions (Addendum)
Please take all prescribed antibiotics exactly as instructed to completion Please make a new primary care provider appointment and follow-up in the next 1 to 2 weeks if possible. Please abstain from illicit drug use You may consume a regular diet as tolerated You may increase your physical activity as physically tolerated. Please return to the emergency department if you develop worsening shortness of breath, fever, weakness or inability to tolerate oral intake

## 2022-04-03 NOTE — TOC Initial Note (Addendum)
Transition of Care [facility]) - Initial/Assessment Note    Patient Details  Name: [name] MRN: [id number] Date of Birth: 25-Sep-1990  Transition of [facility] CM/SW Contact:    Roseanne Kaufman, RN Phone Number: 04/03/2022, 4:32 PM  Clinical Narrative:   This RNCM consulted for SA resources and received secure chat from MD regarding medication assistance. Patient has medical insurance coverage and is not eligible for [facility] program. This RNCM attached SA resources to AVS. MD, RN notified. This RNCM attempted multiple PCP providers: [facility] & [facility] both are closed for the day. Attached [facility] for follow up PCP/ establishment appointment. This RNCM spoke with patient to encourage calling on Monday to make PCP follow up appointment.  No additional TOC needs at this time.                Expected Discharge Plan: Home/Self Care Barriers to Discharge: No Barriers Identified   Patient Goals and CMS Choice Patient states their goals for this hospitalization and ongoing recovery are:: cough, congestion, blood in sputum CMS Medicare.gov Compare Post Acute Care list provided to:: Patient Choice offered to / list presented to : Patient [place] ownership interest in [facility].provided to:: Patient    Expected Discharge Plan and Services In-house Referral: NA Discharge Planning Services: CM Consult Post Acute Care Choice: NA Living arrangements for the past 2 months: Single Family Home                 DME Arranged: N/A DME Agency: NA       HH Arranged: NA HH Agency: NA        Prior Living Arrangements/Services Living arrangements for the past 2 months: [place] with:: Self   Do you feel safe going back to the place where you live?: Yes          Current home services: Other (comment) (none) Criminal Activity/Legal Involvement Pertinent to Current Situation/Hospitalization: No - Comment as needed  Activities of Daily Living       Permission Sought/Granted Permission sought to share information with : Case Manager    Share Information with NAME: Case manager           Emotional Assessment Appearance:: Appears stated age Attitude/Demeanor/Rapport: Unable to Assess Affect (typically observed): Unable to Assess Orientation: : Oriented to Self, Oriented to Place, Oriented to  Time Alcohol / Substance Use: Illicit Drugs, Tobacco Use (crack cocaine, heroine) Psych Involvement: No (comment)  Admission diagnosis:  Acute respiratory failure with hypoxia (HCC) [J96.01] Multifocal pneumonia [J18.9] Patient Active Problem List   Diagnosis Date Noted   Multifocal pneumonia 04/02/2022   Acute respiratory failure with hypoxia due to multifocal pneumonia and drug abuse 04/02/2022   Sepsis due to multifocal pneumonia 04/02/2022   Hematuria 04/02/2022   Hemoptysis 04/02/2022   Substance induced mood disorder ([place]) 04/10/2021   Bipolar disorder, current episode manic w/o psychotic features, severe ([place]) 09/25/2015   Cannabis use disorder, severe, dependence ([place]) 09/25/2015   Opioid use disorder, moderate, dependence ([place]) 09/25/2015   Polysubstance abuse ([place])    Attention deficit disorder 05/01/2014   Chronic back pain 03/05/2013   BMI 27.0-27.9,adult 03/05/2013   PCP:  Patient, No Pcp Per Pharmacy:   [id number] [place], [id number]: [phone]: [phone]    Social Determinants of Health (SDOH) Social History: SDOH Screenings  Depression (PHQ2-9): Low Risk  (08/29/2021)  Tobacco Use: High Risk (04/02/2022)   SDOH Interventions:     Readmission Risk Interventions     No data to display

## 2022-04-03 NOTE — Consult Note (Signed)
[facility] Face-to-Face Psychiatry Consult   Reason for Consult:  Self harm and substance use Referring Physician:  Dr. Rogers Blocker Patient Identification: [name] MRN:  [id number] Principal Diagnosis: Acute respiratory failure with hypoxia [facility]) Diagnosis:  Principal Problem:   Acute respiratory failure with hypoxia due to multifocal pneumonia and drug abuse Active Problems:   Polysubstance abuse ([place])   Substance induced mood disorder ([place])   Multifocal pneumonia   Sepsis due to multifocal pneumonia   Hematuria   Hemoptysis   Total Time spent with patient: 30 minutes  Subjective:   [name] is a 32 y.o. female patient admitted with acute respiratory failure following substance use.   On today's assessment, patient was observed sitting up in bed, dressed in a hospital gown, and she's disheveled, anxious and very talkative. She was cooperative during interview, engaging , makes eye contact, but exhibits akathisia. Her mood is normal and her affect is dysphoric. Her speech is clear, coherent but fast rate. She reports having a mental breakdown on Sunday prior to going to jail for 3 days. She endorses history of self mutilation, has 3 healing wounds to her right fore arm, but denies suicidal attempt, suicidal nor homicidal ideation. 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 She reports a long standing history of polysubstance use. She reports using 1 g of cocaine daily since September 2023, using 1 g of Heroin daily since the age of 22, occasional marijuana use, has been smoking since she was 17 years but cut back to 1/4 ppd last year, denies alcohol use.   On assessing for psychosis, she denies auditory/ visual hallucination, delusion, nor disorganized behavior. She states that she follows up at [facility] with Mr Wynelle Bourgeois, and vehemently denies any interest in substance use rehabilitation this time. Her thought content is linear, insight into her condition and judgement is  poor, though she stated that she will benefit from taking 1 mg of Ativan twice daily. She is not at risk for self harm and does not meet the criteria for inpatient psychiatric admission. Patient appeared to be under the influence of a substance at the time of this evaluation. She is reminded of [facility] policy on substance use and contraband.   HPI:  32 y.o. female with medical history significant for nicotine dependence, polysubstance abuse (cocaine/tobacco/heroine), ADD, mood disorder recently released from prison who presented to ED with hemoptysis and worsening shortness of breath and cough after smoking crack cocaine and snorting heroin.   Past Psychiatric History:   Risk to Self:   Denies Risk to Others:   Denies Prior Inpatient Therapy:   Denies Prior Outpatient Therapy:   Wynelle Bourgeois at [facility]  Past Medical History:  Past Medical History:  Diagnosis Date   Anxiety    Atrial septal defect    corrected 1999   Bipolar disorder ([place])    Depression    Heart murmur    Mental health problem    Self-mutilation    Substance abuse ([place])     Past Surgical History:  Procedure Laterality Date   ASD REPAIR     Family History:  Family History  Problem Relation Age of Onset   Cancer Mother        breast - both   Arthritis Maternal Grandmother        rheumatoid   Cancer Maternal Grandfather        brain   Alzheimer's disease Paternal Grandmother    Mental illness Neg Hx  Drug abuse Neg Hx    Suicidality Neg Hx    Family Psychiatric  History: Denies Social History:  Social History   Substance and Sexual Activity  Alcohol Use Yes   Comment: a few a week     Social History   Substance and Sexual Activity  Drug Use Not on file   Comment: States no per pt    Social History   Socioeconomic History   Marital status: Single    Spouse name: Not on file   Number of children: Not on file   Years of education: Not on file   Highest education level: Not on file  Occupational  History   Not on file  Tobacco Use   Smoking status: Every Day    Packs/day: 2.00    Years: 7.00    Total pack years: 14.00    Types: Cigarettes   Smokeless tobacco: Never  Substance and Sexual Activity   Alcohol use: Yes    Comment: a few a week   Drug use: Not on file    Comment: States no per pt   Sexual activity: Yes    Birth control/protection: None    Comment: number of sex partners in the last 12 months 1  Other Topics Concern   Not on file  Social History Narrative   Not on file   Social Determinants of Health   Financial Resource Strain: Not on file  Food Insecurity: Not on file  Transportation Needs: Not on file  Physical Activity: Not on file  Stress: Not on file  Social Connections: Not on file   Additional Social History:    Allergies:  No Known Allergies  Labs:  Results for orders placed or performed during the hospital encounter of 04/02/22 (from the past 48 hour(s))  Resp panel by RT-PCR (RSV, Flu A&B, Covid) Anterior Nasal Swab     Status: None   Collection Time: 04/02/22  1:34 PM   Specimen: Anterior Nasal Swab  Result Value Ref Range   SARS Coronavirus 2 by RT PCR NEGATIVE NEGATIVE    Comment: (NOTE) SARS-CoV-2 target nucleic acids are NOT DETECTED.  The SARS-CoV-2 RNA is generally detectable in upper respiratory specimens during the acute phase of infection. The lowest concentration of SARS-CoV-2 viral copies this assay can detect is 138 copies/mL. A negative result does not preclude SARS-Cov-2 infection and should not be used as the sole basis for treatment or other patient management decisions. A negative result may occur with  improper specimen collection/handling, submission of specimen other than nasopharyngeal swab, presence of viral mutation(s) within the areas targeted by this assay, and inadequate number of viral copies(<138 copies/mL). A negative result must be combined with clinical observations, patient history, and  epidemiological information. The expected result is Negative.  Fact Sheet for Patients:  EntrepreneurPulse.com.au  Fact Sheet for Healthcare Providers:  IncredibleEmployment.be  This test is no t yet approved or cleared by the [place] FDA and  has been authorized for detection and/or diagnosis of SARS-CoV-2 by FDA under an Emergency Use Authorization (EUA). This EUA will remain  in effect (meaning this test can be used) for the duration of the COVID-19 declaration under Section 564(b)(1) of the Act, 21 U.S.C.section 360bbb-3(b)(1), unless the authorization is terminated  or revoked sooner.       Influenza A by PCR NEGATIVE NEGATIVE   Influenza B by PCR NEGATIVE NEGATIVE    Comment: (NOTE) The Xpert Xpress SARS-CoV-2/FLU/RSV plus assay is intended as an aid  in the diagnosis of influenza from Nasopharyngeal swab specimens and should not be used as a sole basis for treatment. Nasal washings and aspirates are unacceptable for Xpert Xpress SARS-CoV-2/FLU/RSV testing.  Fact Sheet for Patients: EntrepreneurPulse.com.au  Fact Sheet for Healthcare Providers: IncredibleEmployment.be  This test is not yet approved or cleared by the [place] FDA and has been authorized for detection and/or diagnosis of SARS-CoV-2 by FDA under an Emergency Use Authorization (EUA). This EUA will remain in effect (meaning this test can be used) for the duration of the COVID-19 declaration under Section 564(b)(1) of the Act, 21 U.S.C. section 360bbb-3(b)(1), unless the authorization is terminated or revoked.     Resp Syncytial Virus by PCR NEGATIVE NEGATIVE    Comment: (NOTE) Fact Sheet for Patients: EntrepreneurPulse.com.au  Fact Sheet for Healthcare Providers: IncredibleEmployment.be  This test is not yet approved or cleared by the [place] FDA and has been authorized for  detection and/or diagnosis of SARS-CoV-2 by FDA under an Emergency Use Authorization (EUA). This EUA will remain in effect (meaning this test can be used) for the duration of the COVID-19 declaration under Section 564(b)(1) of the Act, 21 U.S.C. section 360bbb-3(b)(1), unless the authorization is terminated or revoked.  Performed at [facility], [address] [place], [place] [address]   Comprehensive metabolic panel     Status: Abnormal   Collection Time: 04/02/22  1:46 PM  Result Value Ref Range   Sodium 135 135 - 145 mmol/L   Potassium 3.5 3.5 - 5.1 mmol/L   Chloride 99 98 - 111 mmol/L   CO2 25 22 - 32 mmol/L   Glucose, Bld 106 (H) 70 - 99 mg/dL    Comment: Glucose reference range applies only to samples taken after fasting for at least 8 hours.   BUN 12 6 - 20 mg/dL   Creatinine, Ser 0.71 0.44 - 1.00 mg/dL   Calcium 9.6 8.9 - 10.3 mg/dL   Total Protein 7.8 6.5 - 8.1 g/dL   Albumin 4.0 3.5 - 5.0 g/dL   AST 10 (L) 15 - 41 U/L   ALT 14 0 - 44 U/L   Alkaline Phosphatase 56 38 - 126 U/L   Total Bilirubin 0.5 0.3 - 1.2 mg/dL   GFR, Estimated >60 >60 mL/min    Comment: (NOTE) Calculated using the CKD-EPI Creatinine Equation (2021)    Anion gap 11 5 - 15    Comment: Performed at [facility], [place], [place] [address]  CBC with Differential     Status: Abnormal   Collection Time: 04/02/22  1:46 PM  Result Value Ref Range   WBC 15.1 (H) 4.0 - 10.5 K/uL   RBC 5.41 (H) 3.87 - 5.11 MIL/uL   Hemoglobin 16.0 (H) 12.0 - 15.0 g/dL   HCT 47.2 (H) 36.0 - 46.0 %   MCV 87.2 80.0 - 100.0 fL   MCH 29.6 26.0 - 34.0 pg   MCHC 33.9 30.0 - 36.0 g/dL   RDW 15.0 11.5 - 15.5 %   Platelets 280 150 - 400 K/uL   nRBC 0.0 0.0 - 0.2 %   Neutrophils Relative % 82 %   Neutro Abs 12.4 (H) 1.7 - 7.7 K/uL   Lymphocytes Relative 7 %   Lymphs Abs 1.0 0.7 - 4.0 K/uL   Monocytes Relative 10 %   Monocytes Absolute 1.5 (H) 0.1 - 1.0 K/uL    Eosinophils Relative 0 %   Eosinophils Absolute 0.0 0.0 - 0.5 K/uL  Basophils Relative 0 %   Basophils Absolute 0.0 0.0 - 0.1 K/uL   Immature Granulocytes 1 %   Abs Immature Granulocytes 0.09 (H) 0.00 - 0.07 K/uL    Comment: Performed at [facility], [address] [place], [place] [address]  Ethanol     Status: None   Collection Time: 04/02/22  1:46 PM  Result Value Ref Range   Alcohol, Ethyl (B) <10 <10 mg/dL    Comment: (NOTE) Lowest detectable limit for serum alcohol is 10 mg/dL.  For medical purposes only. Performed at [facility], [address] [place], [place] [address]   Brain natriuretic peptide     Status: Abnormal   Collection Time: 04/02/22  1:46 PM  Result Value Ref Range   B Natriuretic Peptide 164.0 (H) 0.0 - 100.0 pg/mL    Comment: Performed at [facility], [place], [place] [address]  Troponin I (High Sensitivity)     Status: None   Collection Time: 04/02/22  1:46 PM  Result Value Ref Range   Troponin I (High Sensitivity) 7 <18 ng/L    Comment: (NOTE) Elevated high sensitivity troponin I (hsTnI) values and significant  changes across serial measurements may suggest ACS but many other  chronic and acute conditions are known to elevate hsTnI results.  Refer to the "Links" section for chest pain algorithms and additional  guidance. Performed at [facility], [address] [place], [place] [address]   hCG, quantitative, pregnancy     Status: None   Collection Time: 04/02/22  1:46 PM  Result Value Ref Range   hCG, Beta Chain, Quant, S <1 <5 mIU/mL    Comment:          GEST. AGE      CONC.  (mIU/mL)   <=1 WEEK        5 - 50     2 WEEKS       50 - 500     3 WEEKS       100 - 10,000     4 WEEKS     1,000 - 30,000     5 WEEKS     3,500 - 115,000   6-8 WEEKS     12,000 - 270,000    12 WEEKS     15,000 - 220,000        FEMALE AND NON-PREGNANT FEMALE:     LESS  THAN 5 mIU/mL Performed at [facility], [address] [place], [place] [address]   Blood culture (routine x 2)     Status: None (Preliminary result)   Collection Time: 04/02/22  1:46 PM   Specimen: BLOOD RIGHT WRIST  Result Value Ref Range   Specimen Description      BLOOD RIGHT WRIST Performed at [facility], [address], [place], [place] [address]    Special Requests      BOTTLES DRAWN AEROBIC AND ANAEROBIC Blood Culture adequate volume Performed at [facility], [address] [place], [place] [address]    Culture      NO GROWTH < 24 HOURS Performed at [facility] [address] [place],  [address]    Report Status PENDING   Blood culture (routine x 2)     Status: None (Preliminary result)   Collection Time: 04/02/22  1:46 PM   Specimen: BLOOD RIGHT ARM  Result Value Ref Range   Specimen Description      BLOOD  RIGHT ARM Performed at [facility], [address], [place], [place] [address]    Special Requests      BOTTLES DRAWN AEROBIC AND ANAEROBIC Blood Culture adequate volume Performed at [facility], [address] [place], [place] [address]    Culture      NO GROWTH < 24 HOURS Performed at [facility] [address], [place], [place] [address]    Report Status PENDING   Protime-INR     Status: None   Collection Time: 04/02/22  1:46 PM  Result Value Ref Range   Prothrombin Time 15.2 11.4 - 15.2 seconds   INR 1.2 0.8 - 1.2    Comment: (NOTE) INR goal varies based on device and disease states. Performed at [facility], [address], [place], [place] [address]   APTT     Status: None   Collection Time: 04/02/22  1:46 PM  Result Value Ref Range   aPTT 25 24 - 36 seconds    Comment: Performed at [facility], [address], [place], [place] [address]  I-Stat venous blood gas, ED      Status: Abnormal   Collection Time: 04/02/22  2:03 PM  Result Value Ref Range   pH, Ven 7.372 7.25 - 7.43   pCO2, Ven 40.2 (L) 44 - 60 mmHg   pO2, Ven 53 (H) 32 - 45 mmHg   Bicarbonate 23.3 20.0 - 28.0 mmol/L   TCO2 25 22 - 32 mmol/L   O2 Saturation 86 %   Acid-base deficit 2.0 0.0 - 2.0 mmol/L   Sodium 135 135 - 145 mmol/L   Potassium 3.5 3.5 - 5.1 mmol/L   Calcium, Ion 1.17 1.15 - 1.40 mmol/L   HCT 47.0 (H) 36.0 - 46.0 %   Hemoglobin 16.0 (H) 12.0 - 15.0 g/dL   Sample type VENOUS   Urine rapid drug screen (hosp performed)     Status: Abnormal   Collection Time: 04/02/22  3:40 PM  Result Value Ref Range   Opiates NONE DETECTED NONE DETECTED   Cocaine POSITIVE (A) NONE DETECTED   Benzodiazepines NONE DETECTED NONE DETECTED   Amphetamines NONE DETECTED NONE DETECTED   Tetrahydrocannabinol NONE DETECTED NONE DETECTED   Barbiturates NONE DETECTED NONE DETECTED    Comment: (NOTE) DRUG SCREEN FOR MEDICAL PURPOSES ONLY.  IF CONFIRMATION IS NEEDED FOR ANY PURPOSE, NOTIFY LAB WITHIN 5 DAYS.  LOWEST DETECTABLE LIMITS FOR URINE DRUG SCREEN Drug Class                     Cutoff (ng/mL) Amphetamine and metabolites    1000 Barbiturate and metabolites    200 Benzodiazepine                 200 Opiates and metabolites        300 Cocaine and metabolites        300 THC                            50 Performed at [facility], [place], [place] [address]   Troponin I (High Sensitivity)     Status: None   Collection Time: 04/02/22  3:40 PM  Result Value Ref Range   Troponin I (High Sensitivity) 8 <18 ng/L    Comment: (NOTE) Elevated high sensitivity troponin I (hsTnI) values and significant  changes across serial measurements may suggest ACS but many other  chronic and acute conditions are known to elevate hsTnI results.  Refer to the "Links" section for chest pain algorithms and additional  guidance. Performed at [facility], [address], [place], [place] [address]   Urinalysis, Routine w reflex microscopic -Urine, Clean Catch     Status: Abnormal   Collection Time: 04/02/22  4:04 PM  Result Value Ref Range   Color, Urine YELLOW YELLOW   APPearance HAZY (A) CLEAR   Specific Gravity, Urine 1.032 (H) 1.005 - 1.030   pH 6.0 5.0 - 8.0   Glucose, UA NEGATIVE NEGATIVE mg/dL   Hgb urine dipstick LARGE (A) NEGATIVE   Bilirubin Urine NEGATIVE NEGATIVE   Ketones, ur NEGATIVE NEGATIVE mg/dL   Protein, ur 30 (A) NEGATIVE mg/dL   Nitrite NEGATIVE NEGATIVE   Leukocytes,Ua NEGATIVE NEGATIVE   RBC / HPF >50 0 - 5 RBC/hpf   WBC, UA 0-5 0 - 5 WBC/hpf   Bacteria, UA NONE SEEN NONE SEEN   Squamous Epithelial / HPF 0-5 0 - 5 /HPF   Mucus PRESENT     Comment: Performed at [facility], [address] [place], [place] [address]  MRSA Next Gen by PCR, Nasal     Status: None   Collection Time: 04/02/22  5:30 PM   Specimen: Nasal Mucosa; Nasal Swab  Result Value Ref Range   MRSA by PCR Next Gen NOT DETECTED NOT DETECTED    Comment: (NOTE) The GeneXpert MRSA Assay (FDA approved for NASAL specimens only), is one component of a comprehensive MRSA colonization surveillance program. It is not intended to diagnose MRSA infection nor to guide or monitor treatment for MRSA infections. Test performance is not FDA approved in patients less than 60 years old. Performed at [facility], [place] [address] [place], [place] [address]   Procalcitonin     Status: None   Collection Time: 04/02/22  8:55 PM  Result Value Ref Range   Procalcitonin 1.91 ng/mL    Comment:        Interpretation: PCT > 0.5 ng/mL and <= 2 ng/mL: Systemic infection (sepsis) is possible, but other conditions are known to elevate PCT as well. (NOTE)       Sepsis PCT Algorithm           Lower Respiratory Tract                                      Infection PCT Algorithm    ----------------------------      ----------------------------         PCT < 0.25 ng/mL                PCT < 0.10 ng/mL          Strongly encourage             Strongly discourage   discontinuation of antibiotics    initiation of antibiotics    ----------------------------     -----------------------------       PCT 0.25 - 0.50 ng/mL            PCT 0.10 - 0.25 ng/mL               OR       >80% decrease in PCT            Discourage initiation of  antibiotics      Encourage discontinuation           of antibiotics    ----------------------------     -----------------------------         PCT >= 0.50 ng/mL              PCT 0.26 - 0.50 ng/mL                AND       <80% decrease in PCT             Encourage initiation of                                             antibiotics       Encourage continuation           of antibiotics    ----------------------------     -----------------------------        PCT >= 0.50 ng/mL                  PCT > 0.50 ng/mL               AND         increase in PCT                  Strongly encourage                                      initiation of antibiotics    Strongly encourage escalation           of antibiotics                                     -----------------------------                                           PCT <= 0.25 ng/mL                                                 OR                                        > 80% decrease in PCT                                      Discontinue / Do not initiate                                             antibiotics  Performed at [facility] [address], [place], [place] [address]   HIV Antibody (routine testing w rflx)     Status: None  Collection Time: 04/02/22  8:55 PM  Result Value Ref Range   HIV Screen 4th Generation wRfx Non Reactive Non Reactive    Comment: Performed at [facility], [place] [address] [place], [place] [address]  Procalcitonin      Status: None   Collection Time: 04/03/22  2:59 AM  Result Value Ref Range   Procalcitonin 2.37 ng/mL    Comment:        Interpretation: PCT > 2 ng/mL: Systemic infection (sepsis) is likely, unless other causes are known. (NOTE)       Sepsis PCT Algorithm           Lower Respiratory Tract                                      Infection PCT Algorithm    ----------------------------     ----------------------------         PCT < 0.25 ng/mL                PCT < 0.10 ng/mL          Strongly encourage             Strongly discourage   discontinuation of antibiotics    initiation of antibiotics    ----------------------------     -----------------------------       PCT 0.25 - 0.50 ng/mL            PCT 0.10 - 0.25 ng/mL               OR       >80% decrease in PCT            Discourage initiation of                                            antibiotics      Encourage discontinuation           of antibiotics    ----------------------------     -----------------------------         PCT >= 0.50 ng/mL              PCT 0.26 - 0.50 ng/mL               AND       <80% decrease in PCT              Encourage initiation of                                             antibiotics       Encourage continuation           of antibiotics    ----------------------------     -----------------------------        PCT >= 0.50 ng/mL                  PCT > 0.50 ng/mL               AND         increase in PCT                  Strongly encourage  initiation of antibiotics    Strongly encourage escalation           of antibiotics                                     -----------------------------                                           PCT <= 0.25 ng/mL                                                 OR                                        > 80% decrease in PCT                                      Discontinue / Do not initiate                                              antibiotics  Performed at [facility] [address] [place], [place] [id number]   Basic metabolic panel     Status: Abnormal   Collection Time: 04/03/22  2:59 AM  Result Value Ref Range   Sodium 135 135 - 145 mmol/L   Potassium 3.7 3.5 - 5.1 mmol/L   Chloride 104 98 - 111 mmol/L   CO2 26 22 - 32 mmol/L   Glucose, Bld 98 70 - 99 mg/dL    Comment: Glucose reference range applies only to samples taken after fasting for at least 8 hours.   BUN 11 6 - 20 mg/dL   Creatinine, Ser 0.68 0.44 - 1.00 mg/dL   Calcium 8.0 (L) 8.9 - 10.3 mg/dL   GFR, Estimated >60 >60 mL/min    Comment: (NOTE) Calculated using the CKD-EPI Creatinine Equation (2021)    Anion gap 5 5 - 15    Comment: Performed at [facility], [place] [address] [place], [place] [address]  CBC     Status: Abnormal   Collection Time: 04/03/22  2:59 AM  Result Value Ref Range   WBC 14.1 (H) 4.0 - 10.5 K/uL   RBC 4.18 3.87 - 5.11 MIL/uL   Hemoglobin 12.2 12.0 - 15.0 g/dL   HCT 38.6 36.0 - 46.0 %   MCV 92.3 80.0 - 100.0 fL   MCH 29.2 26.0 - 34.0 pg   MCHC 31.6 30.0 - 36.0 g/dL   RDW 15.3 11.5 - 15.5 %   Platelets 211 150 - 400 K/uL   nRBC 0.0 0.0 - 0.2 %    Comment: Performed at [facility] [place] [address], [place], [place] [address]  Expectorated Sputum Assessment w Gram Stain, Rflx to Resp Cult     Status: None   Collection Time: 04/03/22  7:15 AM  Specimen: Expectorated Sputum  Result Value Ref Range   Specimen Description EXPECTORATED SPUTUM    Special Requests NONE    Sputum evaluation      THIS SPECIMEN IS ACCEPTABLE FOR SPUTUM CULTURE Performed at [facility], [place] [address] [place], [place] [address]    Report Status 04/03/2022 FINAL   Culture, Respiratory w Gram Stain     Status: None (Preliminary result)   Collection Time: 04/03/22  7:15 AM  Result Value Ref Range   Specimen Description      EXPECTORATED SPUTUM Performed at  [facility], [place] [address] [place], [place] [address]    Special Requests      NONE Reflexed from [id number] Performed at [facility], [place] [address], [place], [place] [address]    Gram Stain      ABUNDANT WBC PRESENT, PREDOMINANTLY PMN RARE YEAST WITH PSEUDOHYPHAE RARE GRAM NEGATIVE RODS FEW GRAM POSITIVE COCCI IN PAIRS Performed at [facility], [place] [address], [place], [place] [address]    Culture PENDING    Report Status PENDING   Strep pneumoniae urinary antigen     Status: None   Collection Time: 04/03/22  7:33 AM  Result Value Ref Range   Strep Pneumo Urinary Antigen NEGATIVE NEGATIVE    Comment:        Infection due to S. pneumoniae cannot be absolutely ruled out since the antigen present may be below the detection limit of the test. Performed at [facility], [place] [address] [place], [place] [address]   Lactic acid, plasma     Status: Abnormal   Collection Time: 04/03/22  8:31 AM  Result Value Ref Range   Lactic Acid, Venous 2.1 (HH) 0.5 - 1.9 mmol/L    Comment: CRITICAL RESULT CALLED TO, READ BACK BY AND VERIFIED WITH URQUIJO,I. RN AT [phone] ON 04/03/2022 BY MECIAL J. Performed at [facility], [place] [address] [place] [address], [place], [place] [address]   Lactic acid, plasma     Status: Abnormal   Collection Time: 04/03/22 10:43 AM  Result Value Ref Range   Lactic Acid, Venous 2.7 (HH) 0.5 - 1.9 mmol/L    Comment: CRITICAL RESULT CALLED TO, READ BACK BY AND VERIFIED WITH Loma Messing. RN AT 1205 ON 04/03/2022 BY MECIAL J. Performed at [facility], [place] [address], [place],  [address]     Current Facility-Administered Medications  Medication Dose Route Frequency Provider Last Rate Last Admin   0.9 %  sodium chloride infusion  250 mL Intravenous PRN Orma Flaming, MD       0.9 %  sodium chloride infusion   Intravenous Continuous Orma Flaming, MD   Stopped at 04/03/22 0915    acetaminophen (TYLENOL) tablet 650 mg  650 mg Oral Q6H PRN Orma Flaming, MD   650 mg at 04/02/22 2205   Or   acetaminophen (TYLENOL) suppository 650 mg  650 mg Rectal Q6H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

## 2022-04-03 NOTE — Discharge Summary (Signed)
Physician Discharge Summary   Patient: [name] MRN: [id number] DOB: 1990/05/30  Admit date:     04/02/2022  Discharge date: 04/03/22  Discharge Physician: Vernelle Emerald   PCP: Patient, No Pcp Per   Recommendations at discharge:   Please take all prescribed antibiotics exactly as instructed to completion Please make a new primary care provider appointment and follow-up in the next 1 to 2 weeks if possible. Please abstain from illicit drug use You may consume a regular diet as tolerated You may increase your physical activity as physically tolerated. Please return to the emergency department if you develop worsening shortness of breath, fever, weakness or inability to tolerate oral intake  Discharge Diagnoses: Principal Problem:   Acute respiratory failure with hypoxia due to multifocal pneumonia and drug abuse Active Problems:   Sepsis due to multifocal pneumonia   Hemoptysis   Hematuria   Polysubstance abuse (HCC)   Substance induced mood disorder (HCC)   Multifocal pneumonia  Resolved Problems:   Cocaine use disorder, mild, abuse ([place])   Tobacco use disorder   Hospital Course: 32 y.o. female with medical history significant for nicotine dependence, polysubstance abuse (cocaine/tobacco/heroine), ADD, mood disorder recently released from prison who presented to ED with hemoptysis and worsening shortness of breath and cough after smoking crack cocaine and snorting heroin.    Upon evaluation in the emergency department chest x-ray revealed multifocal infiltrates concerning for pneumonia.  CT angiogram of the chest was additionally performed consistent with multifocal pneumonia worse in the right middle lobe, lingula and right lower lobe.  Vancomycin and Zosyn were initiated.  Hospitalist group was then called to assess the patient for admission the hospital.    While there was some initial concern for possible tuberculosis after further review of the patient's disease  course and risk factors it was felt that tuberculosis was extremely unlikely and therefore AFB testing was not performed.  Over the next 24 hours the patient rapidly clinically improved.  Patient was able to be weaned off of supplemental oxygen to room air.  Patient was able to ambulate and tolerate oral intake without difficulty.  Patient's leukocytosis was downtrending.    Prior to discharge the patient was counseled on cessation of use of illicit substances and was provided with a list of resources in the area to assist her with her efforts to quit.Patient ended up being discharged home in improved and stable condition on 04/03/2022.         Consultants: Caryl Asp with Psychiatry Procedures performed: None  Disposition: Home Diet recommendation:  Discharge Diet Orders (From admission, onward)     Start     Ordered   04/03/22 0000  Diet general        04/03/22 1636           Regular diet  DISCHARGE MEDICATION: Allergies as of 04/03/2022   No Known Allergies      Medication List     TAKE these medications    albuterol 108 (90 Base) MCG/ACT inhaler Commonly known as: VENTOLIN HFA Inhale 2 puffs into the lungs every 4 (four) hours as needed for wheezing or shortness of breath.   amoxicillin-clavulanate 875-125 MG tablet Commonly known as: AUGMENTIN Take 1 tablet by mouth 2 (two) times daily for 7 days.   doxycycline 100 MG capsule Commonly known as: VIBRAMYCIN Take 1 capsule (100 mg total) by mouth 2 (two) times daily for 7 days.   FLUoxetine 20 MG capsule Commonly known as: PROZAC Take  1 capsule (20 mg total) by mouth daily.   gabapentin 400 MG capsule Commonly known as: Neurontin Take 1 capsule (400 mg total) by mouth 3 (three) times daily.   QUEtiapine 100 MG tablet Commonly known as: SEROQUEL TAKE 1 TABLET BY MOUTH EVERYDAY AT BEDTIME   QUEtiapine 200 MG tablet Commonly known as: SEROQUEL TAKE 1 TABLET BY MOUTH AT BEDTIME.         Follow-up [place] Follow up today.   Why: Call to make an appointment Contact information: [address] [place] [phone]                Discharge Exam: Danley Danker Weights   04/02/22 1615 04/02/22 1748  Weight: 68 kg 68.2 kg    Constitutional: Awake alert and oriented x3, somewhat labile affect Respiratory: Scattered rhonchi bilaterally with intermittent mild expiratory wheezing and bibasilar rales.   Normal respiratory effort. No accessory muscle use.  Cardiovascular: Regular rate and rhythm, no murmurs / rubs / gallops. No extremity edema. 2+ pedal pulses. No carotid bruits.  Abdomen: Abdomen is soft and nontender.  No evidence of intra-abdominal masses.  Positive bowel sounds noted in all quadrants.   Musculoskeletal: No joint deformity upper and lower extremities. Good ROM, no contractures. Normal muscle tone.     Condition at discharge: fair  The results of significant diagnostics from this hospitalization (including imaging, microbiology, ancillary and laboratory) are listed below for reference.   Imaging Studies: CT Angio Chest PE W and/or Wo Contrast  Result Date: 04/02/2022 CLINICAL DATA:  Hypoxia, hemoptysis, cough EXAM: CT ANGIOGRAPHY CHEST WITH CONTRAST TECHNIQUE: Multidetector CT imaging of the chest was performed using the standard protocol during bolus administration of intravenous contrast. Multiplanar CT image reconstructions and MIPs were obtained to evaluate the vascular anatomy. RADIATION DOSE REDUCTION: This exam was performed according to the departmental dose-optimization program which includes automated exposure control, adjustment of the mA and/or kV according to patient size and/or use of iterative reconstruction technique. CONTRAST:  40m OMNIPAQUE IOHEXOL 350 MG/ML SOLN COMPARISON:  Same day chest x-ray FINDINGS: Cardiovascular: Satisfactory opacification of the pulmonary arteries  to the segmental level. No evidence of pulmonary embolism. Thoracic aorta is normal in course and caliber. Normal heart size. No pericardial effusion. Mediastinum/Nodes: Mildly prominent mediastinal and bilateral hilar lymph nodes are likely reactive. Reference node includes 11 mm precarinal node (series 4, image 57). No axillary lymphadenopathy. Thyroid, trachea, and esophagus within normal limits. Lungs/Pleura: Dense airspace consolidations with air bronchograms in the right middle lobe, lingula, and posterior basal segment of the right lower lobe. Additional patchy airspace consolidations within the left lower lobe. Additional areas of ground-glass opacity and tree-in-bud nodularity within the upper lobes bilaterally. Diffuse bronchial wall thickening. No pleural effusion or pneumothorax. Upper Abdomen: No acute abnormality. Musculoskeletal: Prior median sternotomy. No acute osseous abnormality. No chest wall abnormality. Review of the MIP images confirms the above findings. IMPRESSION: 1. No evidence of pulmonary embolism. 2. Findings most consistent with multifocal pneumonia, most severely involving the right middle lobe, lingula, and right lower lobe. 3. Mildly prominent mediastinal and bilateral hilar lymph nodes, likely reactive. Electronically Signed   By: NDavina PokeD.O.   On: 04/02/2022 15:41   DG Chest Portable 1 View  Result Date: 04/02/2022 CLINICAL DATA:  hypoxia, tachycardia EXAM: PORTABLE CHEST 1 VIEW COMPARISON:  Radiograph 08/06/2017 FINDINGS: Prior median sternotomy and clip in the anterior mediastinum. Unchanged cardiomediastinal silhouette. There are  patchy airspace disease in the lower lungs. No pleural effusion or evidence of pneumothorax. No acute osseous abnormality. IMPRESSION: Patchy airspace disease in the lower lungs consistent with multifocal pneumonia. Recommend imaging follow-up to resolution. Electronically Signed   By: Maurine Simmering M.D.   On: 04/02/2022 13:58     Microbiology: Results for orders placed or performed during the hospital encounter of 04/02/22  Resp panel by RT-PCR (RSV, Flu A&B, Covid) Anterior Nasal Swab     Status: None   Collection Time: 04/02/22  1:34 PM   Specimen: Anterior Nasal Swab  Result Value Ref Range Status   SARS Coronavirus 2 by RT PCR NEGATIVE NEGATIVE Final    Comment: (NOTE) SARS-CoV-2 target nucleic acids are NOT DETECTED.  The SARS-CoV-2 RNA is generally detectable in upper respiratory specimens during the acute phase of infection. The lowest concentration of SARS-CoV-2 viral copies this assay can detect is 138 copies/mL. A negative result does not preclude SARS-Cov-2 infection and should not be used as the sole basis for treatment or other patient management decisions. A negative result may occur with  improper specimen collection/handling, submission of specimen other than nasopharyngeal swab, presence of viral mutation(s) within the areas targeted by this assay, and inadequate number of viral copies(<138 copies/mL). A negative result must be combined with clinical observations, patient history, and epidemiological information. The expected result is Negative.  Fact Sheet for Patients:  EntrepreneurPulse.com.au  Fact Sheet for Healthcare Providers:  IncredibleEmployment.be  This test is no t yet approved or cleared by the [place] FDA and  has been authorized for detection and/or diagnosis of SARS-CoV-2 by FDA under an Emergency Use Authorization (EUA). This EUA will remain  in effect (meaning this test can be used) for the duration of the COVID-19 declaration under Section 564(b)(1) of the Act, 21 U.S.C.section 360bbb-3(b)(1), unless the authorization is terminated  or revoked sooner.       Influenza A by PCR NEGATIVE NEGATIVE Final   Influenza B by PCR NEGATIVE NEGATIVE Final    Comment: (NOTE) The Xpert Xpress SARS-CoV-2/FLU/RSV plus assay is intended  as an aid in the diagnosis of influenza from Nasopharyngeal swab specimens and should not be used as a sole basis for treatment. Nasal washings and aspirates are unacceptable for Xpert Xpress SARS-CoV-2/FLU/RSV testing.  Fact Sheet for Patients: EntrepreneurPulse.com.au  Fact Sheet for Healthcare Providers: IncredibleEmployment.be  This test is not yet approved or cleared by the [place] FDA and has been authorized for detection and/or diagnosis of SARS-CoV-2 by FDA under an Emergency Use Authorization (EUA). This EUA will remain in effect (meaning this test can be used) for the duration of the COVID-19 declaration under Section 564(b)(1) of the Act, 21 U.S.C. section 360bbb-3(b)(1), unless the authorization is terminated or revoked.     Resp Syncytial Virus by PCR NEGATIVE NEGATIVE Final    Comment: (NOTE) Fact Sheet for Patients: EntrepreneurPulse.com.au  Fact Sheet for Healthcare Providers: IncredibleEmployment.be  This test is not yet approved or cleared by the [place] FDA and has been authorized for detection and/or diagnosis of SARS-CoV-2 by FDA under an Emergency Use Authorization (EUA). This EUA will remain in effect (meaning this test can be used) for the duration of the COVID-19 declaration under Section 564(b)(1) of the Act, 21 U.S.C. section 360bbb-3(b)(1), unless the authorization is terminated or revoked.  Performed at [facility], [address] [place], [place] [address]   Blood culture (routine x 2)     Status: None (Preliminary result)  Collection Time: 04/02/22  1:46 PM   Specimen: BLOOD RIGHT WRIST  Result Value Ref Range Status   Specimen Description   Final    BLOOD RIGHT WRIST Performed at [facility], [address] [place], [place] [address]    Special Requests   Final    BOTTLES DRAWN AEROBIC AND ANAEROBIC Blood  Culture adequate volume Performed at [facility], [address] [place], [place] [address]    Culture   Final    NO GROWTH < 24 HOURS Performed at [facility], [place] [address], [place], [place] [address]    Report Status PENDING  Incomplete  Blood culture (routine x 2)     Status: None (Preliminary result)   Collection Time: 04/02/22  1:46 PM   Specimen: BLOOD RIGHT ARM  Result Value Ref Range Status   Specimen Description   Final    BLOOD RIGHT ARM Performed at [facility], [address], [place], [place] [address]    Special Requests   Final    BOTTLES DRAWN AEROBIC AND ANAEROBIC Blood Culture adequate volume Performed at [facility], [address] [place], [place] [address]    Culture   Final    NO GROWTH < 24 HOURS Performed at [facility], [place] [address], [place], [place] [address]    Report Status PENDING  Incomplete  MRSA Next Gen by PCR, Nasal     Status: None   Collection Time: 04/02/22  5:30 PM   Specimen: Nasal Mucosa; Nasal Swab  Result Value Ref Range Status   MRSA by PCR Next Gen NOT DETECTED NOT DETECTED Final    Comment: (NOTE) The GeneXpert MRSA Assay (FDA approved for NASAL specimens only), is one component of a comprehensive MRSA colonization surveillance program. It is not intended to diagnose MRSA infection nor to guide or monitor treatment for MRSA infections. Test performance is not FDA approved in patients less than 53 years old. Performed at [facility], [place] [address] [place], [place] [address]   Expectorated Sputum Assessment w Gram Stain, Rflx to Resp Cult     Status: None   Collection Time: 04/03/22  7:15 AM   Specimen: Expectorated Sputum  Result Value Ref Range Status   Specimen Description EXPECTORATED SPUTUM  Final   Special Requests NONE  Final   Sputum evaluation   Final    THIS SPECIMEN IS ACCEPTABLE FOR SPUTUM CULTURE Performed at  [facility], [place] [address], [place], [place] [address]    Report Status 04/03/2022 FINAL  Final  Culture, Respiratory w Gram Stain     Status: None (Preliminary result)   Collection Time: 04/03/22  7:15 AM  Result Value Ref Range Status   Specimen Description   Final    EXPECTORATED SPUTUM Performed at [facility] [address], [place], [place] [address]    Special Requests   Final    NONE Reflexed from [id number] Performed at [facility], [place] [address], [place],  [address]    Gram Stain   Final    ABUNDANT WBC PRESENT, PREDOMINANTLY PMN RARE YEAST WITH PSEUDOHYPHAE RARE GRAM NEGATIVE RODS FEW GRAM POSITIVE COCCI IN PAIRS Performed at [facility], [place] [address] [place],  [address]    Culture PENDING  Incomplete   Report Status PENDING  Incomplete    Labs: CBC: Recent Labs  Lab 04/02/22 1346 04/02/22 1403 04/03/22 0259  WBC  15.1*  --  14.1*  NEUTROABS 12.4*  --   --   HGB 16.0* 16.0* 12.2  HCT 47.2* 47.0* 38.6  MCV 87.2  --  92.3  PLT 280  --  123456   Basic Metabolic Panel: Recent Labs  Lab 04/02/22 1346 04/02/22 1403 04/03/22 0259  NA 135 135 135  K 3.5 3.5 3.7  CL 99  --  104  CO2 25  --  26  GLUCOSE 106*  --  98  BUN 12  --  11  CREATININE 0.71  --  0.68  CALCIUM 9.6  --  8.0*   Liver Function Tests: Recent Labs  Lab 04/02/22 1346  AST 10*  ALT 14  ALKPHOS 56  BILITOT 0.5  PROT 7.8  ALBUMIN 4.0   CBG: No results for input(s): "GLUCAP" in the last 168 hours.  Discharge time spent: greater than 30 minutes.  Signed: Vernelle Emerald, MD [facility] 04/03/2022

## 2022-04-03 NOTE — Hospital Course (Addendum)
32 y.o. female with medical history significant for nicotine dependence, polysubstance abuse (cocaine/tobacco/heroine), ADD, mood disorder recently released from prison who presented to ED with hemoptysis and worsening shortness of breath and cough after smoking crack cocaine and snorting heroin.    Upon evaluation in the emergency department chest x-ray revealed multifocal infiltrates concerning for pneumonia.  CT angiogram of the chest was additionally performed consistent with multifocal pneumonia worse in the right middle lobe, lingula and right lower lobe.  Vancomycin and Zosyn were initiated.  Hospitalist group was then called to assess the patient for admission the hospital.    While there was some initial concern for possible tuberculosis after further review of the patient's disease course and risk factors it was felt that tuberculosis was extremely unlikely and therefore AFB testing was not performed.  Over the next 24 hours the patient rapidly clinically improved.  Patient was able to be weaned off of supplemental oxygen to room air.  Patient was able to ambulate and tolerate oral intake without difficulty.  Patient's leukocytosis was downtrending.    Prior to discharge the patient was counseled on cessation of use of illicit substances and was provided with a list of resources in the area to assist her with her efforts to quit.Patient ended up being discharged home in improved and stable condition on 04/03/2022.

## 2022-04-05 LAB — CULTURE, RESPIRATORY W GRAM STAIN: Culture: NORMAL

## 2022-04-07 LAB — QUANTIFERON-TB GOLD PLUS: QuantiFERON-TB Gold Plus: UNDETERMINED — AB

## 2022-04-07 LAB — CULTURE, BLOOD (ROUTINE X 2)
Culture: NO GROWTH
Culture: NO GROWTH
Special Requests: ADEQUATE
Special Requests: ADEQUATE

## 2022-04-07 LAB — LEGIONELLA PNEUMOPHILA SEROGP 1 UR AG: L. pneumophila Serogp 1 Ur Ag: NEGATIVE

## 2022-04-07 LAB — QUANTIFERON-TB GOLD PLUS (RQFGPL)
QuantiFERON Mitogen Value: 0.06 IU/mL
QuantiFERON Nil Value: 0.02 IU/mL
QuantiFERON TB1 Ag Value: 0.01 IU/mL
QuantiFERON TB2 Ag Value: 0.02 IU/mL

## 2022-04-23 ENCOUNTER — Other Ambulatory Visit (HOSPITAL_COMMUNITY): Payer: Self-pay | Admitting: Psychiatry

## 2022-04-24 ENCOUNTER — Other Ambulatory Visit (HOSPITAL_COMMUNITY): Payer: Self-pay | Admitting: Psychiatry

## 2022-04-24 DIAGNOSIS — F1994 Other psychoactive substance use, unspecified with psychoactive substance-induced mood disorder: Secondary | ICD-10-CM

## 2022-05-26 ENCOUNTER — Other Ambulatory Visit (HOSPITAL_COMMUNITY): Payer: Self-pay | Admitting: Psychiatry

## 2022-05-26 DIAGNOSIS — F1994 Other psychoactive substance use, unspecified with psychoactive substance-induced mood disorder: Secondary | ICD-10-CM

## 2022-06-20 ENCOUNTER — Other Ambulatory Visit (HOSPITAL_COMMUNITY): Payer: Self-pay | Admitting: Psychiatry

## 2022-06-20 DIAGNOSIS — F1994 Other psychoactive substance use, unspecified with psychoactive substance-induced mood disorder: Secondary | ICD-10-CM

## 2022-06-24 ENCOUNTER — Other Ambulatory Visit (HOSPITAL_COMMUNITY): Payer: Self-pay | Admitting: Psychiatry

## 2022-06-24 DIAGNOSIS — F1994 Other psychoactive substance use, unspecified with psychoactive substance-induced mood disorder: Secondary | ICD-10-CM

## 2022-07-16 ENCOUNTER — Other Ambulatory Visit (HOSPITAL_COMMUNITY): Payer: Self-pay | Admitting: Psychiatry

## 2022-08-19 ENCOUNTER — Encounter (HOSPITAL_COMMUNITY): Payer: Self-pay | Admitting: Licensed Clinical Social Worker

## 2022-08-19 ENCOUNTER — Ambulatory Visit (INDEPENDENT_AMBULATORY_CARE_PROVIDER_SITE_OTHER): Payer: 59 | Admitting: Licensed Clinical Social Worker

## 2022-08-19 DIAGNOSIS — F3113 Bipolar disorder, current episode manic without psychotic features, severe: Secondary | ICD-10-CM

## 2022-08-19 DIAGNOSIS — F909 Attention-deficit hyperactivity disorder, unspecified type: Secondary | ICD-10-CM

## 2022-08-19 NOTE — Progress Notes (Signed)
Comprehensive Clinical Assessment (CCA) Note  08/19/2022 [name] [id number]  Chief Complaint:  Chief Complaint  Patient presents with   ADHD   Anxiety   Depression   Visit Diagnosis: Bipolar disorder and ADHD    Client is a 32 year old female . Client is referred by self  for a depression, anxiety, and lack of focus.   Client states mental health symptoms as evidenced by:       Depression Change in energy/activity; Irritability; Difficulty Concentrating; Weight gain/loss Change in energy/activity; Irritability; Difficulty Concentrating; Weight gain/lossDepression. Change in energy/activity; Irritability; Difficulty Concentrating; Weight gain/loss. Last Filed Value  Duration of Depressive Symptoms -- Greater than two weeksDuration of Depressive Symptoms. Greater than two weeks. Data is from another encounter. Last Filed Value  Mania Change in energy/activity; Increased Energy; Irritability; Recklessness; Racing thoughts Change in energy/activity; Increased Energy; Irritability; Recklessness; Racing thoughtsMania. Change in energy/activity; Increased Energy; Irritability; Recklessness; Racing thoughts. Last Filed Value  Anxiety Irritability; Worrying; Tension Irritability; Worrying; TensionAnxiety. Irritability; Worrying; Tension. Last Filed Value  Psychosis None NonePsychosis. None. Last Filed Value  Trauma None NoneTrauma. None. Last Filed Value  Obsessions None NoneObsessions. None. Last Filed Value  Compulsions None NoneCompulsions. None. Last Filed Value  Inattention Disorganized; Fails to pay attention/makes careless mistakes; Poor follow-through on tasks; Avoids/dislikes activities that require focus Disorganized; Fails to pay attention/makes careless mistakes; Poor follow-through on tasks; Avoids/dislikes activities that require focusInattention. Disorganized; Fails to pay attention/makes careless mistakes; Poor follow-through on tasks; Avoids/dislikes activities that require  focus. Last Filed Value  Hyperactivity/Impulsivity Always on the go; Blurts out answers Always on the go; Blurts out answersHyperactivity/Impulsivity. Always on the go; Blurts out answers. Last Filed Value  Oppositional/Defiant Behaviors N/A N/AOppositional/Defiant Behaviors. N/A. Last Filed Value  Emotional Irregularity None NoneEmotional Irregularity. None. Last Filed Value  Other Mood/Personality Symptoms NA NAOther Mood/Personality Symptoms. NA. Last Filed Value     Client denies suicidal and homicidal ideations at this time  Client denies hallucinations and delusions at this time   Client was screened for the following SDOH: Smoking, financials, exercise, stress\tension, social interaction, depression, health literacy    Assessment Information that integrates subjective and objective details with a therapist's professional interpretation:    Patient was alert and oriented x 5.  Patient was pleasant, cooperative, maintained good eye contact.  She engaged well in comprehensive clinical assessment.  She was dressed casually.  She presented today with depressed and anxious mood\affect.  Patient comes in to reestablish care.  Patient was being seen by medication providers at [facility] by physician assistant and nurse practitioner.  Patient reports medications being prescribed her Prozac, Neurontin, and Seroquel.  Patient reports also wanting to add on medications for "lack of focus".  Patient reports that she is struggling reading things and cannot remember them when she does.  Patient also endorses symptoms for lack of motivation, fatigue, oversleeping, and worthlessness feelings.  Lovey reports history of bipolar disorder.  LCSW addressed substance-induced mood disorder that was listed on patient's chart and patient reports no history of any drugs.  LCSW sees an extensive history per chart review of drug use for cocaine and heroin.  Patient would like to  reestablish care so she can restart her medication management.  Client states use of the following substances: Per chart review Hx of heroin, cocaine, and marijuana.  Patient denies following drug use at this time.      Client was in agreement with treatment recommendations.   CCA Screening,  Triage and Referral (STR)  Patient Reported Information Referral name: Pt trying to restblish care  Whom do you see for routine medical problems? Primary Care; I don't have a doctor  What Is the Reason for Your Visit/Call Today? No data recorded How Long Has This Been Causing You Problems? > than 6 months  What Do You Feel Would Help You the Most Today? Treatment for Depression or other mood problem  Have You Recently Been in Any Inpatient Treatment (Hospital/Detox/Crisis Center/28-Day Program)? No  Have You Ever Received Services From [employer] Before? No   Have You Recently Had Any Thoughts About Hurting Yourself? No  Are You Planning to Commit Suicide/Harm Yourself At This time? No   Have you Recently Had Thoughts About Hurting Someone Karolee Ohs? No   Have You Used Any Alcohol or Drugs in the Past 24 Hours? No  Do You Currently Have a Therapist/Psychiatrist? No   Have You Been Recently Discharged From Any Office Practice or Programs? No     CCA Screening Triage Referral Assessment Type of Contact: Face-to-Face   Collateral Involvement: None available   Is CPS involved or ever been involved? Never  Is APS involved or ever been involved? Never  Patient Determined To Be At Risk for Harm To Self or Others Based on Review of Patient Reported Information or Presenting Complaint? No  Method: No Plan  Availability of Means: No access or NA  Intent: Vague intent or NA  Notification Required: No need or identified person  Are There Guns or Other Weapons in Your Home? No  Are These Weapons Safely Secured?                            No  Location of Assessment: [facility]  Assessment Services   Does Patient Present under Involuntary Commitment? No   [place] of Residence: [place]    CCA Biopsychosocial   Mental Health Symptoms Depression:   Change in energy/activity; Irritability; Difficulty Concentrating; Weight gain/loss   Duration of Depressive symptoms: No data recorded  Mania:   Change in energy/activity; Increased Energy; Irritability; Recklessness; Racing thoughts   Anxiety:    Irritability; Worrying; Tension   Psychosis:   None   Duration of Psychotic symptoms: No data recorded  Trauma:   None   Obsessions:   None   Compulsions:   None   Inattention:   Disorganized; Fails to pay attention/makes careless mistakes; Poor follow-through on tasks; Avoids/dislikes activities that require focus   Hyperactivity/Impulsivity:   Always on the go; Blurts out answers   Oppositional/Defiant Behaviors:   N/A   Emotional Irregularity:   None   Other Mood/Personality Symptoms:   NA    Mental Status Exam Appearance and self-care  Stature:   Average   Weight:   Average weight   Clothing:   -- (Scrubs)   Grooming:   Normal   Cosmetic use:   Age appropriate   Posture/gait:   Normal   Motor activity:   Not Remarkable   Sensorium  Attention:   Inattentive   Concentration:   Variable   Orientation:   Object; Person; Place; Situation   Recall/memory:   Defective in Immediate   Affect and Mood  Affect:   Constricted   Mood:   Irritable   Relating  Eye contact:   Fleeting   Facial expression:   Tense   Attitude toward examiner:   Uninterested   Thought and Language  Speech  flow:  Paucity   Thought content:   Appropriate to Mood and Circumstances   Preoccupation:   None   Hallucinations:   None   Organization:  No data recorded  [employer] of Knowledge:   Average   Intelligence:   Average   Abstraction:   Normal   Judgement:   Impaired   Reality Testing:    Adequate   Insight:   Lacking   Decision Making:   Impulsive   Social Functioning  Social Maturity:   Impulsive   Social Judgement:   Heedless   Stress  Stressors:   Other (Comment) (Unable to assess - Pt uncooperative)   Coping Ability:   Exhausted   Skill Deficits:   None   Supports:   Family     Religion: Religion/Spirituality Are You A Religious Person?: No (Unable to assess - Pt uncooperative)  Leisure/Recreation: Leisure / Recreation Do You Have Hobbies?: Yes (Unable to assess - Pt uncooperative) Leisure and Hobbies: exercise, outdoor activities, running, biking, soccer, volleyball  Exercise/Diet: Exercise/Diet Do You Exercise?: No (Unable to assess - Pt uncooperative) Have You Gained or Lost A Significant Amount of Weight in the Past Six Months?: No (Unable to assess - Pt uncooperative) Do You Follow a Special Diet?: No (Unable to assess - Pt uncooperative) Do You Have Any Trouble Sleeping?: No   CCA Employment/Education Employment/Work Situation: Employment / Work Situation Employment Situation: Unemployed Patient's Job has Been Impacted by Current Illness: Yes Describe how Patient's Job has Been Impacted: fatigue and lack of mtoivation What is the Longest Time Patient has Held a Job?: 3 years 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?: No  Education: Education Is Patient Currently Attending School?: No Last Grade Completed: 10 Did Garment/textile technologist From [employer]?: Yes (GED) Did You Attend College?: Yes What Type of College Degree Do you Have?: did not graduate Did You Attend Graduate School?: No Did You Have An Individualized Education Program (IIEP): No Did You Have Any Difficulty At School?: No Patient's Education Has Been Impacted by Current Illness: No   CCA Family/Childhood History Family and Relationship History: Family history Marital status: Single Are you sexually  active?: Yes What is your sexual orientation?: heterosexual Has your sexual activity been affected by drugs, alcohol, medication, or emotional stress?: none reported Does patient have children?: No  Childhood History:  Childhood History By whom was/is the patient raised?: Both parents Additional childhood history information: parents divorced when she was 10 Description of patient's relationship with caregiver when they were a child: good Patient's description of current relationship with people who raised him/her: good How were you disciplined when you got in trouble as a child/adolescent?: unknown Does patient have siblings?: Yes Number of Siblings: 2 Description of patient's current relationship with siblings: wounderful Did patient suffer any verbal/emotional/physical/sexual abuse as a child?: No Did patient suffer from severe childhood neglect?: No Has patient ever been sexually abused/assaulted/raped as an adolescent or adult?: No Was the patient ever a victim of a crime or a disaster?: No Witnessed domestic violence?: No Has patient been affected by domestic violence as an adult?: No  Child/Adolescent Assessment:     DSM5 Diagnoses: Patient Active Problem List   Diagnosis Date Noted   Multifocal pneumonia 04/02/2022   Acute respiratory failure with hypoxia due to multifocal pneumonia and drug abuse 04/02/2022   Sepsis due to multifocal pneumonia 04/02/2022   Hematuria 04/02/2022   Hemoptysis  04/02/2022   Substance induced mood disorder (HCC) 04/10/2021   Bipolar disorder, current episode manic w/o psychotic features, severe (HCC) 09/25/2015   Cannabis use disorder, severe, dependence (HCC) 09/25/2015   Opioid use disorder, moderate, dependence (HCC) 09/25/2015   Polysubstance abuse (HCC)    Attention deficit disorder 05/01/2014   Chronic back pain 03/05/2013   BMI 27.0-27.9,adult 03/05/2013      Referrals to Alternative Service(s): Referred to Alternative  Service(s):   Place:   Date:   Time:    Referred to Alternative Service(s):   Place:   Date:   Time:    Referred to Alternative Service(s):   Place:   Date:   Time:    Referred to Alternative Service(s):   Place:   Date:   Time:      Collaboration of Care: Other Referral to medication provider to reestablish care  Patient/Guardian was advised Release of Information must be obtained prior to any record release in order to collaborate their care with an outside provider. Patient/Guardian was advised if they have not already done so to contact the registration department to sign all necessary forms in order for [place] to release information regarding their care.   Consent: Patient/Guardian gives verbal consent for treatment and assignment of benefits for services provided during this visit. Patient/Guardian expressed understanding and agreed to proceed.   Weber Cooks, LCSW

## 2022-09-18 ENCOUNTER — Encounter (HOSPITAL_COMMUNITY): Payer: Self-pay

## 2022-09-18 ENCOUNTER — Telehealth (HOSPITAL_COMMUNITY): Payer: Self-pay | Admitting: Physician Assistant

## 2022-09-18 NOTE — Telephone Encounter (Signed)
Called PT to remind of appt on 09/22/22. Voicemail was full, couldn't leave msg. Sent MyChart reminder to call by noon on Monday 09/21/22 to confirm or appt will be cancelled

## 2022-09-22 ENCOUNTER — Encounter (HOSPITAL_COMMUNITY): Payer: Self-pay | Admitting: Physician Assistant

## 2022-09-22 ENCOUNTER — Ambulatory Visit (INDEPENDENT_AMBULATORY_CARE_PROVIDER_SITE_OTHER): Payer: 59 | Admitting: Physician Assistant

## 2022-09-22 VITALS — BP 125/70 | HR 80 | Temp 97.8°F | Ht 70.5 in | Wt 180.4 lb

## 2022-09-22 DIAGNOSIS — F411 Generalized anxiety disorder: Secondary | ICD-10-CM

## 2022-09-22 DIAGNOSIS — F3132 Bipolar disorder, current episode depressed, moderate: Secondary | ICD-10-CM

## 2022-09-22 DIAGNOSIS — F909 Attention-deficit hyperactivity disorder, unspecified type: Secondary | ICD-10-CM

## 2022-09-22 MED ORDER — FLUOXETINE HCL 20 MG PO CAPS: 20.0000 mg | ORAL_CAPSULE | Freq: Every day | ORAL | 1 refills | Status: DC

## 2022-09-22 MED ORDER — GABAPENTIN 100 MG PO CAPS: 200.0000 mg | ORAL_CAPSULE | Freq: Three times a day (TID) | ORAL | 1 refills | Status: DC

## 2022-09-22 MED ORDER — QUETIAPINE FUMARATE 50 MG PO TABS: 50.0000 mg | ORAL_TABLET | Freq: Every day | ORAL | 1 refills | Status: DC

## 2022-09-22 MED ORDER — ATOMOXETINE HCL 10 MG PO CAPS: 10.0000 mg | ORAL_CAPSULE | Freq: Every day | ORAL | 1 refills | Status: DC

## 2022-09-22 NOTE — Progress Notes (Signed)
Psychiatric Initial Adult Assessment   Patient Identification: [name] MRN:  [id number] Date of Evaluation:  09/22/2022 Referral Source: To reestablish psychiatric care Chief Complaint:   Chief Complaint  Patient presents with   Establish Care   Medication Management   Visit Diagnosis:    ICD-10-CM   1. Attention deficit hyperactivity disorder (ADHD), unspecified ADHD type  F90.9 atomoxetine (STRATTERA) 10 MG capsule    2. Bipolar affective disorder, currently depressed, moderate (HCC)  F31.32 FLUoxetine (PROZAC) 20 MG capsule    QUEtiapine (SEROQUEL) 50 MG tablet    3. Generalized anxiety disorder  F41.1 FLUoxetine (PROZAC) 20 MG capsule    gabapentin (NEURONTIN) 100 MG capsule      History of Present Illness:    [name] is a 32 year old female with a past psychiatric history significant for generalized anxiety disorder, attention deficit hyperactivity disorder (unspecified ADHD type), and bipolar affective disorder (depressed, moderate) who presents to [facility] to reestablish psychiatric care.  Patient presents to the encounter stating that she has been struggling with depression and trying to get her life together.  She also reports that she has been struggling with attention and focus.  In regards to her depression, patient states that she has been struggling with depression for 16 years.  She endorses a past history significant for PTSD, depression, ADHD, and bipolar disorder.  Patient reports that whenever she is taking medications regularly, she experiences no symptoms related to her depression.  Patient reports that she has been on the following psychiatric medications: Fluoxetine, gabapentin, and Seroquel.  Patient rates her depression a 9 out of 10 with 10 being most severe.  Patient states that the severity of her depression depends on the situation she is N.  Patient endorses depressive episodes every day.  Patient  endorses the following depressive symptoms: sadness, decreased concentration, lack of motivation, irritability, excessive worrying, feelings of guilt/worthlessness, and hopelessness.  Patient reports that her depression is alleviated by exercising and being around level once.  She reports that on some days, she wakes up in a different mood but unable to pinpoint what makes her depressive symptoms worse.  In addition to depression, patient endorses anxiety and rates her anxiety at 10 out of 10.  Triggers to her anxiety include not getting things done properly due to her ADHD and currently looking for work.  In regards to her ADHD symptoms, patient reports that she has a hard time maintaining focus.  Patient also reports difficulty with staying on task or getting activities done effectively.  Patient endorses a past history of hospitalization due to mental health but denies a past history of suicide attempts.  A PHQ-9 screen was performed with the patient scoring a 17.  A GAD-7 screen was also performed with the patient scoring a 16.  Patient is alert and oriented x 4, calm, cooperative, and fully engaged in conversation during the encounter.  Patient endorses good mood.  Patient denies suicidal or homicidal ideations.  She further denies auditory or visual hallucinations and does not appear to be responding to internal/external stimuli.  Patient denies paranoia or delusional thoughts.  Patient endorses good sleep and receives on average 7 hours of sleep per night.  Patient endorses fair appetite and eats on average 2 meals per day.  Patient denies alcohol consumption or illicit drug use.  Patient endorses tobacco use and smokes on average a pack per day.  Associated Signs/Symptoms: Depression Symptoms:  depressed mood, anhedonia, psychomotor agitation,  psychomotor retardation, fatigue, feelings of worthlessness/guilt, difficulty concentrating, impaired memory, anxiety, weight gain, decreased  appetite, (Hypo) Manic Symptoms:  Distractibility, Flight of Ideas, Impulsivity, Irritable Mood, Anxiety Symptoms:  Excessive Worry, Psychotic Symptoms:   Patient denies PTSD Symptoms: Negative  Past Psychiatric History:  Patient endorses a past psychiatric history significant for PTSD, bipolar disorder, depression, and ADHD  Patient endorses a past history of hospitalization due to mental health.  Patient reports that she was hospitalized at [facility] voluntarily after her ex was released from prison.  Patient states that she voluntarily admitted herself to receive resources that would "increase her and ex's chances for success."  Patient denies a past history of suicide attempt  Patient denies a past history of homicide attempts  Previous Psychotropic Medications: Yes   Substance Abuse History in the last 12 months:  Yes.    Consequences of Substance Abuse: Patient reports that 2 years ago she used opiates.  Per chart review, patient had a urine drug screen performed on 04/02/2022 that was significant for the presence of cocaine. Medical Consequences:  Patient denies Legal Consequences:  Patient endorses receiving charges for stealing from stores while under the influence Family Consequences:  Patient reports that her familyWhen she was on illicit substances Blackouts:  Patient denies DT's: Patient denies Withdrawal Symptoms:   None Patient alluded to being enrolled in rehab facilities but never finishing.  Past Medical History:  Past Medical History:  Diagnosis Date   Anxiety    Atrial septal defect    corrected 1999   Bipolar disorder (HCC)    Depression    Heart murmur    Mental health problem    Self-mutilation    Substance abuse (HCC)     Past Surgical History:  Procedure Laterality Date   ASD REPAIR      Family Psychiatric History:  Patient denies family history of psychiatric illness  Family history of suicide attempts: Patient denies Family  history of homicide attempts: Patient denies Family history of substance abuse: Patient denies  Family History:  Family History  Problem Relation Age of Onset   Cancer Mother        breast - both   Arthritis Maternal Grandmother        rheumatoid   Cancer Maternal Grandfather        brain   Alzheimer's disease Paternal Grandmother    Mental illness Neg Hx    Drug abuse Neg Hx    Suicidality Neg Hx     Social History:   Social History   Socioeconomic History   Marital status: Single    Spouse name: Not on file   Number of children: Not on file   Years of education: Not on file   Highest education level: Not on file  Occupational History   Not on file  Tobacco Use   Smoking status: Every Day    Current packs/day: 2.00    Average packs/day: 2.0 packs/day for 7.0 years (14.0 ttl pk-yrs)    Types: Cigarettes   Smokeless tobacco: Never  Substance and Sexual Activity   Alcohol use: Not Currently    Comment: a few a week   Drug use: Not Currently    Frequency: 3.0 times per week    Types: Marijuana, Cocaine    Comment: States no per pt   Sexual activity: Yes    Birth control/protection: None    Comment: 2 partners  Other Topics Concern   Not on file  Social History Narrative  Not on file   Social Determinants of Health   Financial Resource Strain: Medium Risk (08/19/2022)   Overall Financial Resource Strain (CARDIA)    Difficulty of Paying Living Expenses: Somewhat hard  Food Insecurity: No Food Insecurity (10/01/2021)   Received from [facility]   Hunger Vital Sign    Worried About Running Out of Food in the Last Year: Never true    Ran Out of Food in the Last Year: Never true  Transportation Needs: No Transportation Needs (08/19/2022)   PRAPARE - Administrator, Civil Service (Medical): No    Lack of Transportation (Non-Medical): No  Physical Activity: Inactive (08/19/2022)   Exercise Vital Sign    Days of Exercise per Week: 0 days     Minutes of Exercise per Session: 0 min  Stress: Stress Concern Present (08/19/2022)   [employer] of Occupational Health - Occupational Stress Questionnaire    Feeling of Stress : Rather much  Social Connections: Socially Isolated (08/19/2022)   Social Connection and Isolation Panel [NHANES]    Frequency of Communication with Friends and Family: More than three times a week    Frequency of Social Gatherings with Friends and Family: More than three times a week    Attends Religious Services: Never    Database administrator or Organizations: No    Attends Banker Meetings: Never    Marital Status: Never married    Additional Social History:  Patient endorses social support through his family members and friends.  Patient denies having children of her own.  Patient endorses housing.  Patient denies being employed at this time.  Patient denies a past history of military experience.  Patient denies a past history of prison or jail time.  Highest education earned by the patient is her GED.  Patient states that she has completed some courses at [facility] in [place].  Patient denies access to weapons.  Allergies:  No Known Allergies  Metabolic Disorder Labs: Lab Results  Component Value Date   HGBA1C 5.2 09/26/2015   MPG 103 09/26/2015   Lab Results  Component Value Date   PROLACTIN 32.7 (H) 09/26/2015   Lab Results  Component Value Date   CHOL 139 09/26/2015   TRIG 88 09/26/2015   HDL 56 09/26/2015   CHOLHDL 2.5 09/26/2015   VLDL 18 09/26/2015   LDLCALC 65 09/26/2015   Lab Results  Component Value Date   TSH 0.907 09/26/2015    Therapeutic Level Labs: Lab Results  Component Value Date   LITHIUM 1.14 02/20/2007   No results found for: "CBMZ" Lab Results  Component Value Date   VALPROATE 66 09/30/2015    Current Medications: Current Outpatient Medications  Medication Sig Dispense Refill   atomoxetine (STRATTERA) 10 MG capsule Take 1 capsule (10 mg  total) by mouth daily. 30 capsule 1   FLUoxetine (PROZAC) 20 MG capsule Take 1 capsule (20 mg total) by mouth daily. 30 capsule 1   albuterol (VENTOLIN HFA) 108 (90 Base) MCG/ACT inhaler Inhale 2 puffs into the lungs every 4 (four) hours as needed for wheezing or shortness of breath. 6.7 g 0   gabapentin (NEURONTIN) 100 MG capsule Take 2 capsules (200 mg total) by mouth 3 (three) times daily. 180 capsule 1   QUEtiapine (SEROQUEL) 50 MG tablet Take 1 tablet (50 mg total) by mouth at bedtime. 30 tablet 1   No current facility-administered medications for this visit.    Musculoskeletal: Strength & Muscle  Tone: within normal limits Gait & Station: normal Patient leans: N/A  Psychiatric Specialty Exam: Review of Systems  Psychiatric/Behavioral:  Positive for decreased concentration. Negative for dysphoric mood, hallucinations, self-injury, sleep disturbance and suicidal ideas. The patient is nervous/anxious. The patient is not hyperactive.     There were no vitals taken for this visit.There is no height or weight on file to calculate BMI.  General Appearance: Casual  Eye Contact:  Good  Speech:  Clear and Coherent and Normal Rate  Volume:  Normal  Mood:  Anxious and Depressed  Affect:  Congruent  Thought Process:  Coherent, Goal Directed, and Descriptions of Associations: Intact  Orientation:  Full (Time, Place, and Person)  Thought Content:  Tangential  Suicidal Thoughts:  No  Homicidal Thoughts:  No  Memory:  Immediate;   Good Recent;   Good Remote;   Fair  Judgement:  Fair  Insight:  Fair  Psychomotor Activity:  Normal  Concentration:  Concentration: Fair and Attention Span: Fair  Recall:  Good  Fund of Knowledge:Good  Language: Good  Akathisia:  No  Handed:  Left  AIMS (if indicated):  not done  Assets:  Communication Skills Desire for Improvement Housing Physical Health Social Support  ADL's:  Intact  Cognition: WNL  Sleep:  Good   Screenings: AIMS    Flowsheet  Row ED to [facility]  AIMS Total Score 0      AUDIT    Flowsheet Row ED to [facility]osp-Ad[facility] (Dis[facility]  Alcohol Use Disorder Identification Test Final Score (AUDIT) 0      GAD-7    Flowsheet Row Office Visit from 09/22/2022 in [facility] Counselor from 08/19/2022 in [facility] Video Visit from 08/29/2021 in [facility] Office Visit from 05/27/2021 in [facility] Office Visit from 04/10/2021 in [facility]  Total GAD-7 Score 16 7 3  0 8      PHQ2-9    Flowsheet Row Office Visit from 09/22/2022 in [facility] Counselor from 08/19/2022 in [facility] Video Visit from 08/29/2021 in [facility] Office Visit from 05/27/2021 in [facility] Office Visit from 04/10/2021 in [facility]  PHQ-2 Total Score 4 4 0 0 6  PHQ-9 Total Score 17 13 4  -- 24      Flowsheet Row Office Visit from 09/22/2022 in [facility] ED to Hosp-Admission (Discharged) from 04/02/2022 in [place]  [facility]ICU[facility] Office Visit from 05/27/2021 in [facility]  C-SSRS RISK CATEGORY No Risk No Risk Low Risk       Assessment and Plan:   Leeza E. Swearingen is a 32 year old female with a past psychiatric history significant for generalized anxiety disorder, attention deficit hyperactivity disorder (unspecified ADHD type), and bipolar affective disorder (depressed, moderate) who presents to [facility] to reestablish psychiatric care.  Patient presents today encounter reporting issues with her  attention/concentration.  In addition to issues with focus, patient also endorses ongoing depressive symptoms and elevated anxiety.  Patient was previously seen by this provider and prescribed the following psychiatric medications: Gabapentin 400 mg 3 times daily, Prozac 20 mg daily, and Seroquel 150 mg at bedtime.  Patient was not currently taking any medications at this time,  but states that when she was on medications, she reports that the medications were helpful in managing her symptoms.  Patient inquired about being placed on Adderall for the management of her ADHD.  Provider informed patient that due to her past history of substance abuse, she would not be placed on any controlled substances.  Patient vocalized understanding.  Provider recommended patient being placed on Strattera 10 mg daily for the management of her focus/concentration.  Patient was also placed back on Prozac 20 mg daily and Seroquel 50 mg at bedtime for the management of her depressive symptoms and anxiety.  Patient was agreeable to recommendations.  Patient's medications to be e-prescribed to pharmacy of choice.  Collaboration of Care: Medication Management AEB provider managing patient's psychiatric medications and Psychiatrist AEB patient being followed by mental health provider at this facility  Patient/Guardian was advised Release of Information must be obtained prior to any record release in order to collaborate their care with an outside provider. Patient/Guardian was advised if they have not already done so to contact the registration department to sign all necessary forms in order for [place] to release information regarding their care.   Consent: Patient/Guardian gives verbal consent for treatment and assignment of benefits for services provided during this visit. Patient/Guardian expressed understanding and agreed to proceed.   1. Attention deficit hyperactivity disorder (ADHD), unspecified ADHD type  - atomoxetine  (STRATTERA) 10 MG capsule; Take 1 capsule (10 mg total) by mouth daily.  Dispense: 30 capsule; Refill: 1  2. Bipolar affective disorder, currently depressed, moderate (HCC)  - FLUoxetine (PROZAC) 20 MG capsule; Take 1 capsule (20 mg total) by mouth daily.  Dispense: 30 capsule; Refill: 1 - QUEtiapine (SEROQUEL) 50 MG tablet; Take 1 tablet (50 mg total) by mouth at bedtime.  Dispense: 30 tablet; Refill: 1  3. Generalized anxiety disorder  - FLUoxetine (PROZAC) 20 MG capsule; Take 1 capsule (20 mg total) by mouth daily.  Dispense: 30 capsule; Refill: 1 - gabapentin (NEURONTIN) 100 MG capsule; Take 2 capsules (200 mg total) by mouth 3 (three) times daily.  Dispense: 180 capsule; Refill: 1  Patient to follow up in 6 weeks Provider spent a total of 41 minutes with the patient/reviewing patient's chart  Meta Hatchet, PA 8/27/20247:26 PM

## 2022-10-13 DIAGNOSIS — F1120 Opioid dependence, uncomplicated: Secondary | ICD-10-CM | POA: Diagnosis not present

## 2022-10-14 ENCOUNTER — Other Ambulatory Visit (HOSPITAL_COMMUNITY): Payer: Self-pay | Admitting: Physician Assistant

## 2022-10-14 DIAGNOSIS — F3132 Bipolar disorder, current episode depressed, moderate: Secondary | ICD-10-CM

## 2022-10-14 DIAGNOSIS — F909 Attention-deficit hyperactivity disorder, unspecified type: Secondary | ICD-10-CM

## 2022-10-14 DIAGNOSIS — F411 Generalized anxiety disorder: Secondary | ICD-10-CM

## 2022-10-20 DIAGNOSIS — F1120 Opioid dependence, uncomplicated: Secondary | ICD-10-CM | POA: Diagnosis not present

## 2022-10-21 ENCOUNTER — Telehealth (HOSPITAL_COMMUNITY): Payer: Self-pay | Admitting: Physician Assistant

## 2022-10-22 NOTE — Telephone Encounter (Signed)
Message acknowledged and reviewed.  Patient should have one refill left at her preferred pharmacy.

## 2022-10-27 DIAGNOSIS — F1120 Opioid dependence, uncomplicated: Secondary | ICD-10-CM | POA: Diagnosis not present

## 2022-11-03 ENCOUNTER — Encounter (HOSPITAL_COMMUNITY): Payer: 59 | Admitting: Physician Assistant

## 2022-11-03 DIAGNOSIS — F1120 Opioid dependence, uncomplicated: Secondary | ICD-10-CM | POA: Diagnosis not present

## 2022-11-10 DIAGNOSIS — F1120 Opioid dependence, uncomplicated: Secondary | ICD-10-CM | POA: Diagnosis not present

## 2022-11-13 ENCOUNTER — Telehealth (HOSPITAL_COMMUNITY): Payer: Self-pay | Admitting: Licensed Clinical Social Worker

## 2022-11-13 NOTE — Telephone Encounter (Signed)
The therapist receives a request for information from Cosette C. Nerogic, B.S., CADC-I, [facility] Care Manager with a signed ROI.   The therapist sends the following, secure email to Ms. Nerogic:  "Good morning,  Ms. Hannasch was seen for a Comprehensive Clinical Assessment on 08/19/22 and then seen for an initial psychiatric evaluation on 09/22/22. She no showed for her follow-up med eval on 11/03/22 and currently has no return appointments scheduled with [place].  Sincerely,"  Myrna Blazer, MA, LCSW, [facility], LCAS 11/13/2022

## 2022-11-17 DIAGNOSIS — F1120 Opioid dependence, uncomplicated: Secondary | ICD-10-CM | POA: Diagnosis not present

## 2022-11-24 DIAGNOSIS — F1120 Opioid dependence, uncomplicated: Secondary | ICD-10-CM | POA: Diagnosis not present

## 2022-12-01 DIAGNOSIS — F1120 Opioid dependence, uncomplicated: Secondary | ICD-10-CM | POA: Diagnosis not present

## 2022-12-08 DIAGNOSIS — F1120 Opioid dependence, uncomplicated: Secondary | ICD-10-CM | POA: Diagnosis not present

## 2022-12-15 DIAGNOSIS — F1120 Opioid dependence, uncomplicated: Secondary | ICD-10-CM | POA: Diagnosis not present

## 2022-12-16 ENCOUNTER — Other Ambulatory Visit (HOSPITAL_COMMUNITY): Payer: Self-pay | Admitting: Physician Assistant

## 2022-12-16 DIAGNOSIS — F411 Generalized anxiety disorder: Secondary | ICD-10-CM

## 2022-12-17 ENCOUNTER — Telehealth (HOSPITAL_COMMUNITY): Payer: Self-pay | Admitting: Licensed Clinical Social Worker

## 2022-12-17 NOTE — Telephone Encounter (Signed)
The therapist receives the following email from Ms. Cosette C. Nerogic, B.S., CADC-I with [facility]:  "Good Afternoon Mr. [name],   I am reaching out in regard to [employer]. Has she reported back for medication management? I saw her in my office on 12/14/22 and she reported interest in getting in touch with a therapist.   Please let me know if you need any information, and I greatly appreciate your continued support. Thank you.    Thank You,"  The therapist responds with the following, secure email:  "Good afternoon,  If [name] is interested in being connected to a therapist, please have her call [facility] at [phone]; and they should can assist her.   She had a refill of her medications sent in yesterday by Mr. Karel Jarvis, PA; however, she has no follow-up medication appointments in our system for reasons I am unable to determine.   Please let me know if you have any additional questions or concerns about this case. If [name] has any questions or concerns, she can reach me directly at [phone].  Sincerely,"  Myrna Blazer, MA, LCSW, [facility], LCAS 12/17/2022

## 2022-12-22 DIAGNOSIS — F1120 Opioid dependence, uncomplicated: Secondary | ICD-10-CM | POA: Diagnosis not present

## 2022-12-29 DIAGNOSIS — F1120 Opioid dependence, uncomplicated: Secondary | ICD-10-CM | POA: Diagnosis not present

## 2022-12-30 ENCOUNTER — Encounter (HOSPITAL_COMMUNITY): Payer: 59 | Admitting: Physician Assistant

## 2023-01-05 DIAGNOSIS — F1120 Opioid dependence, uncomplicated: Secondary | ICD-10-CM | POA: Diagnosis not present

## 2023-01-12 DIAGNOSIS — F1120 Opioid dependence, uncomplicated: Secondary | ICD-10-CM | POA: Diagnosis not present

## 2023-01-19 DIAGNOSIS — F1120 Opioid dependence, uncomplicated: Secondary | ICD-10-CM | POA: Diagnosis not present

## 2023-01-26 DIAGNOSIS — F1120 Opioid dependence, uncomplicated: Secondary | ICD-10-CM | POA: Diagnosis not present

## 2023-02-02 DIAGNOSIS — F1120 Opioid dependence, uncomplicated: Secondary | ICD-10-CM | POA: Diagnosis not present

## 2023-02-09 DIAGNOSIS — F1120 Opioid dependence, uncomplicated: Secondary | ICD-10-CM | POA: Diagnosis not present

## 2023-02-16 ENCOUNTER — Other Ambulatory Visit (HOSPITAL_COMMUNITY): Payer: Self-pay | Admitting: Psychiatry

## 2023-02-16 DIAGNOSIS — F1120 Opioid dependence, uncomplicated: Secondary | ICD-10-CM | POA: Diagnosis not present

## 2023-02-23 DIAGNOSIS — F1120 Opioid dependence, uncomplicated: Secondary | ICD-10-CM | POA: Diagnosis not present

## 2023-03-02 DIAGNOSIS — F1120 Opioid dependence, uncomplicated: Secondary | ICD-10-CM | POA: Diagnosis not present

## 2023-03-09 DIAGNOSIS — F1120 Opioid dependence, uncomplicated: Secondary | ICD-10-CM | POA: Diagnosis not present

## 2023-03-16 DIAGNOSIS — F1120 Opioid dependence, uncomplicated: Secondary | ICD-10-CM | POA: Diagnosis not present

## 2023-03-23 DIAGNOSIS — F1120 Opioid dependence, uncomplicated: Secondary | ICD-10-CM | POA: Diagnosis not present

## 2023-03-30 DIAGNOSIS — F1120 Opioid dependence, uncomplicated: Secondary | ICD-10-CM | POA: Diagnosis not present

## 2023-04-06 DIAGNOSIS — F1120 Opioid dependence, uncomplicated: Secondary | ICD-10-CM | POA: Diagnosis not present

## 2023-04-13 DIAGNOSIS — F1120 Opioid dependence, uncomplicated: Secondary | ICD-10-CM | POA: Diagnosis not present

## 2023-04-20 DIAGNOSIS — F1120 Opioid dependence, uncomplicated: Secondary | ICD-10-CM | POA: Diagnosis not present

## 2023-04-27 DIAGNOSIS — F1120 Opioid dependence, uncomplicated: Secondary | ICD-10-CM | POA: Diagnosis not present

## 2023-05-04 DIAGNOSIS — F1120 Opioid dependence, uncomplicated: Secondary | ICD-10-CM | POA: Diagnosis not present

## 2023-05-05 ENCOUNTER — Other Ambulatory Visit (HOSPITAL_COMMUNITY): Payer: Self-pay | Admitting: Physician Assistant

## 2023-05-05 ENCOUNTER — Other Ambulatory Visit (HOSPITAL_COMMUNITY): Payer: Self-pay | Admitting: Psychiatry

## 2023-05-05 DIAGNOSIS — F411 Generalized anxiety disorder: Secondary | ICD-10-CM

## 2023-05-11 DIAGNOSIS — F1120 Opioid dependence, uncomplicated: Secondary | ICD-10-CM | POA: Diagnosis not present

## 2023-05-18 DIAGNOSIS — F1120 Opioid dependence, uncomplicated: Secondary | ICD-10-CM | POA: Diagnosis not present

## 2023-05-25 DIAGNOSIS — F1120 Opioid dependence, uncomplicated: Secondary | ICD-10-CM | POA: Diagnosis not present

## 2023-06-01 ENCOUNTER — Ambulatory Visit (INDEPENDENT_AMBULATORY_CARE_PROVIDER_SITE_OTHER): Admitting: Physician Assistant

## 2023-06-01 ENCOUNTER — Encounter (HOSPITAL_COMMUNITY): Payer: Self-pay | Admitting: Physician Assistant

## 2023-06-01 VITALS — BP 80/43 | HR 42 | Temp 97.7°F | Ht 70.5 in | Wt 180.8 lb

## 2023-06-01 DIAGNOSIS — F909 Attention-deficit hyperactivity disorder, unspecified type: Secondary | ICD-10-CM | POA: Diagnosis not present

## 2023-06-01 DIAGNOSIS — Z758 Other problems related to medical facilities and other health care: Secondary | ICD-10-CM | POA: Insufficient documentation

## 2023-06-01 DIAGNOSIS — F411 Generalized anxiety disorder: Secondary | ICD-10-CM

## 2023-06-01 DIAGNOSIS — F3132 Bipolar disorder, current episode depressed, moderate: Secondary | ICD-10-CM | POA: Insufficient documentation

## 2023-06-01 MED ORDER — ATOMOXETINE HCL 10 MG PO CAPS: 10.0000 mg | ORAL_CAPSULE | Freq: Every day | ORAL | 1 refills | Status: DC

## 2023-06-01 MED ORDER — GABAPENTIN 100 MG PO CAPS: ORAL_CAPSULE | ORAL | 0 refills | Status: AC

## 2023-06-01 MED ORDER — FLUOXETINE HCL 20 MG PO CAPS: 20.0000 mg | ORAL_CAPSULE | Freq: Every day | ORAL | 1 refills | Status: DC

## 2023-06-01 MED ORDER — QUETIAPINE FUMARATE 200 MG PO TABS: 200.0000 mg | ORAL_TABLET | Freq: Every day | ORAL | 1 refills | Status: DC

## 2023-06-01 MED ORDER — GABAPENTIN 300 MG PO CAPS: 300.0000 mg | ORAL_CAPSULE | Freq: Three times a day (TID) | ORAL | 1 refills | Status: DC

## 2023-06-01 MED ORDER — QUETIAPINE FUMARATE 50 MG PO TABS: ORAL_TABLET | ORAL | 0 refills | Status: AC

## 2023-06-01 NOTE — Progress Notes (Signed)
 [facility] MD/PA/NP OP Progress Note  06/01/2023 11:31 AM [name]  MRN:  [id number]  Chief Complaint:  Chief Complaint  Patient presents with   Follow-up   Medication Management   HPI:   [name] is a 33 year old female with past psychiatric history significant for attention deficit hyperactivity disorder (unspecified ADHD type), bipolar affective disorder (currently depressed, moderate), and generalized anxiety disorder who presents to [facility] to reestablish psychiatric care and for medication management.  Patient was last seen by this provider on 09/22/2022.  During her last encounter, patient was being managed on the following psychiatric medications:  Atomoxetine  10 mg daily Fluoxetine  20 mg daily Seroquel  50 mg at bedtime Gabapentin  100 mg 3 times daily  Patient reports that she is requesting to be placed back on her originally prescribed medications.  She reports that she has not been on some of her medications for the last month.  She reports that she was last prescribed Seroquel  50 mg at bedtime but states that the dosage was not helpful due to previously being prescribed 200 mg of Seroquel  in the past.  Patient denies the following manic symptoms: irritability, increased energy, sleep deficits, or risky behavior.  Patient endorses depression and rates her depression a 4-5 out of 10 with 10 being most severe.  Patient endorses depressive episodes 2 days out of the week.  Patient endorses the following depressive symptoms: feelings of sadness, decreased concentration, and feelings of guilt.  Patient denies lack of motivation, feelings of worthlessness, irritability, decreased energy, or hopelessness.  She does endorse anxiety and rates her anxiety in 8 out of 10.  Patient denies any stressors at this time.  A PHQ-9 screen was performed with the patient scoring a 7.  A GAD-7 screen was also performed with the patient scoring an  11.  Patient is alert and oriented x 4, calm, cooperative, and fully engaged in conversation during the encounter.  Patient endorses good mood.  Patient exhibits depressed mood with appropriate affect.  Patient denies suicidal or homicidal ideations.  Patient denies auditory or visual hallucinations and does not appear to be responding to internal soft external stimuli.  Patient endorses fair sleep and receives on average 8 to 10 hours of intermittent sleep per night.  She reports that she often wakes up roughly 3 hours at a time.  Patient endorses fair appetite and eats on average 1-1/2 meals per day.  Patient denies alcohol consumption.  Patient endorses tobacco use and smokes on average 8 cigarettes/day.  Patient denies active illicit drug use but states that she recently used fentanyl 30 days ago.  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  Visit Diagnosis:    ICD-10-CM   1. Attention deficit hyperactivity disorder (ADHD), unspecified ADHD type  F90.9 atomoxetine  (STRATTERA ) 10 MG capsule    2. Bipolar affective disorder, currently depressed, moderate (HCC)  F31.32 QUEtiapine  (SEROQUEL ) 50 MG tablet    QUEtiapine  (SEROQUEL ) 200 MG tablet    3. Generalized anxiety disorder  F41.1 gabapentin  (NEURONTIN ) 100 MG capsule    FLUoxetine  (PROZAC ) 20 MG capsule    gabapentin  (NEURONTIN ) 300 MG capsule    4. Does not have primary care provider  Z75.8 Ambulatory referral to Internal Medicine      Past Psychiatric History:  Patient endorses a past psychiatric history significant for PTSD, bipolar disorder, depression, and ADHD   Patient endorses a past history of hospitalization due  to mental health.  Patient reports that she was hospitalized at [facility] voluntarily after her ex was released from prison.  Patient states that she voluntarily admitted herself to receive resources that would "increase her and ex's chances for success."    Patient denies a past history of suicide attempt   Patient denies a past history of homicide attempts  Past Medical History:  Past Medical History:  Diagnosis Date   Anxiety    Atrial septal defect    corrected 1999   Bipolar disorder (HCC)    Depression    Heart murmur    Mental health problem    Self-mutilation    Substance abuse (HCC)     Past Surgical History:  Procedure Laterality Date   ASD REPAIR      Family Psychiatric History:  Patient denies family history of psychiatric illness   Family history of suicide attempts: Patient denies Family history of homicide attempts: Patient denies Family history of substance abuse: Patient denies  Family History:  Family History  Problem Relation Age of Onset   Cancer Mother        breast - both   Arthritis Maternal Grandmother        rheumatoid   Cancer Maternal Grandfather        brain   Alzheimer's disease Paternal Grandmother    Mental illness Neg Hx    Drug abuse Neg Hx    Suicidality Neg Hx     Social History:  Social History   Socioeconomic History   Marital status: Single    Spouse name: Not on file   Number of children: Not on file   Years of education: Not on file   Highest education level: Not on file  Occupational History   Not on file  Tobacco Use   Smoking status: Every Day    Current packs/day: 2.00    Average packs/day: 2.0 packs/day for 7.0 years (14.0 ttl pk-yrs)    Types: Cigarettes   Smokeless tobacco: Never  Substance and Sexual Activity   Alcohol use: Not Currently    Comment: a few a week   Drug use: Not Currently    Frequency: 3.0 times per week    Types: Marijuana, Cocaine    Comment: States no per pt   Sexual activity: Yes    Birth control/protection: None    Comment: 2 partners  Other Topics Concern   Not on file  Social History Narrative   Not on file   Social Drivers of Health   Financial Resource Strain: Medium Risk (08/19/2022)   Overall Financial Resource Strain  (CARDIA)    Difficulty of Paying Living Expenses: Somewhat hard  Food Insecurity: No Food Insecurity (10/01/2021)   Received from [facility]   Hunger Vital Sign    Worried About Running Out of Food in the Last Year: Never true    Ran Out of Food in the Last Year: Never true  Transportation Needs: No Transportation Needs (08/19/2022)   PRAPARE - Administrator, Civil Service (Medical): No    Lack of Transportation (Non-Medical): No  Physical Activity: Inactive (08/19/2022)   Exercise Vital Sign    Days of Exercise per Week: 0 days    Minutes of Exercise per Session: 0 min  Stress: Stress Concern Present (08/19/2022)   [employer] of Occupational Health - Occupational Stress Questionnaire    Feeling of Stress : Rather much  Social Connections: Socially Isolated (08/19/2022)  Social Connection and Isolation Panel [NHANES]    Frequency of Communication with Friends and Family: More than three times a week    Frequency of Social Gatherings with Friends and Family: More than three times a week    Attends Religious Services: Never    Database administrator or Organizations: No    Attends Engineer, structural: Never    Marital Status: Never married    Allergies: No Known Allergies  Metabolic Disorder Labs: Lab Results  Component Value Date   HGBA1C 5.2 09/26/2015   MPG 103 09/26/2015   Lab Results  Component Value Date   PROLACTIN 32.7 (H) 09/26/2015   Lab Results  Component Value Date   CHOL 139 09/26/2015   TRIG 88 09/26/2015   HDL 56 09/26/2015   CHOLHDL 2.5 09/26/2015   VLDL 18 09/26/2015   LDLCALC 65 09/26/2015   Lab Results  Component Value Date   TSH 0.907 09/26/2015   TSH 2.124 08/10/2012    Therapeutic Level Labs: Lab Results  Component Value Date   LITHIUM  1.14 02/20/2007   LITHIUM  0.29 (L) 02/16/2007   Lab Results  Component Value Date   VALPROATE 66 09/30/2015   VALPROATE 86.3 03/24/2011   No results found  for: "CBMZ"  Current Medications: Current Outpatient Medications  Medication Sig Dispense Refill   [START ON 06/04/2023] gabapentin  (NEURONTIN ) 300 MG capsule Take 1 capsule (300 mg total) by mouth 3 (three) times daily. 90 capsule 1   [START ON 06/13/2023] QUEtiapine  (SEROQUEL ) 200 MG tablet Take 1 tablet (200 mg total) by mouth at bedtime. 30 tablet 1   albuterol  (VENTOLIN  HFA) 108 (90 Base) MCG/ACT inhaler Inhale 2 puffs into the lungs every 4 (four) hours as needed for wheezing or shortness of breath. 6.7 g 0   atomoxetine  (STRATTERA ) 10 MG capsule Take 1 capsule (10 mg total) by mouth daily. 90 capsule 1   FLUoxetine  (PROZAC ) 20 MG capsule Take 1 capsule (20 mg total) by mouth daily. 30 capsule 1   gabapentin  (NEURONTIN ) 100 MG capsule Take 1 capsule (100 mg total) by mouth 3 (three) times daily for 2 days, THEN 2 capsules (200 mg total) 3 (three) times daily for 2 days. 18 capsule 0   QUEtiapine  (SEROQUEL ) 50 MG tablet Take 1 tablet (50 mg total) by mouth at bedtime for 4 days, THEN 2 tablets (100 mg total) at bedtime for 4 days, THEN 3 tablets (150 mg total) at bedtime for 4 days. 24 tablet 0   No current facility-administered medications for this visit.     Musculoskeletal: Strength & Muscle Tone: within normal limits Gait & Station: normal Patient leans: N/A  Psychiatric Specialty Exam: Review of Systems  Psychiatric/Behavioral:  Positive for dysphoric mood and sleep disturbance. Negative for decreased concentration, hallucinations, self-injury and suicidal ideas. The patient is nervous/anxious. The patient is not hyperactive.     Blood pressure (!) 80/43, pulse (!) 42, temperature 97.7 F (36.5 C), temperature source Core, height 5' 10.5" (1.791 m), weight 180 lb 12.8 oz (82 kg), SpO2 99%.Body mass index is 25.58 kg/m.  General Appearance: Casual  Eye Contact:  Good  Speech:  Clear and Coherent  Volume:  Normal  Mood:  Anxious and Depressed  Affect:  Appropriate  Thought  Process:  Coherent, Goal Directed, and Descriptions of Associations: Intact  Orientation:  Full (Time, Place, and Person)  Thought Content: WDL   Suicidal Thoughts:  No  Homicidal Thoughts:  No  Memory:  Immediate;  Good Recent;   Good Remote;   Fair  Judgement:  Fair  Insight:  Fair  Psychomotor Activity:  Normal  Concentration:  Concentration: Fair and Attention Span: Fair  Recall:  Good  Fund of Knowledge: Good  Language: Good  Akathisia:  No  Handed:  Left  AIMS (if indicated): not done  Assets:  Communication Skills Desire for Improvement Housing Physical Health Social Support  ADL's:  Intact  Cognition: WNL  Sleep:  Fair   Screenings: AIMS    Flowsheet Row Office Visit from 06/01/2023 in [facility] ED to Hosp[facility]Ad[facility] (Discharged[facility]  AIMS Total Score 3 0      AUDIT    Flowsheet Row ED to Hosp-Admission (Discharged) [facility]  Alcohol Use Disorder Identification Test Final Score (AUDIT) 0      GAD-7    Flowsheet Row Office Visit from 06/01/2023 in [facility] Office Visit from 09/22/2022 in [facility] Counselor from 08/19/2022 in [facility] Video Visit from 08/29/2021 in [facility] Office Visit from 05/27/2021 in [facility]  Total GAD-7 Score 11 16 7 3  0      PHQ2-9    Flowsheet Row Office Visit from 06/01/2023 in [facility] Office Visit from 09/22/2022 in [facility] Counselor from 08/19/2022 in [facility] Video Visit from 08/29/2021 in [facility] Office Visit from 05/27/2021 in [facility]  PHQ-2 Total Score 2 4 4  0 0  PHQ-9 Total Score 7  17 13 4  --      Flowsheet Row Office Visit from 06/01/2023 in [facility] Office Visit from 09/22/2022 in [facility] ED to Hosp-Admission (Discharged) from 04/02/2022 in [facility]STE[facility]  C-SSRS RISK CATEGORY No Risk No Risk No Risk        Assessment and Plan:   Ashling E. Fehring is a 33 year old female with past psychiatric history significant for attention deficit hyperactivity disorder (unspecified ADHD type), bipolar affective disorder (currently depressed, moderate), and generalized anxiety disorder who presents to [facility] to reestablish psychiatric care and for medication management.  Patient was last seen by this provider on 09/22/2022.  During her last encounter, patient was being managed on the following psychiatric medications:  Atomoxetine  10 mg daily Fluoxetine  20 mg daily Seroquel  50 mg at bedtime Gabapentin  100 mg 3 times daily  Patient presents to the encounter requesting to be placed back on her previously prescribed medications.  She reports that her use of Seroquel  50 mg at bedtime was ineffective in managing her mood due to a past history of being on Seroquel  200 mg at bedtime.  Patient denies manic symptoms but does endorse some depressive episodes.  She also endorses anxiety.  A PHQ-9 screen was performed with the patient scored a 7.  A GAD-7 screen was also performed with the patient scoring at an 11.  Patient would like to be placed back on Seroquel  200 mg at bedtime for mood stability.  Provider instructed patient to take Seroquel  50 mg at bedtime for 4 days, followed by 100 mg at bedtime for 4 more days, followed by 150 mg for 4 days, then continue taking 200 mg at bedtime.  Patient vocalized understanding.  In regards to her anxiety, patient requested to be placed on a higher dose of gabapentin .  Provider to increase patient's gabapentin  from 100  mg 3 times daily to 300 mg 3 times daily for the management of her anxiety.  Provider instructed patient to take gabapentin  100 mg 3 times daily for 2 days, followed by 200 mg 3 times daily for 2 more days, then continue taking gabapentin  300 mg 3 times daily.  Patient vocalized understanding.  Patient to continue taking all other medications as prescribed.  Prior to the conclusion of the encounter, patient requested to be placed on Ativan  for the management of her anxiety.  Given patient's past history of drug use (patient openly admitted to taking fentanyl 30 days ago), provider to not prescribe Ativan  to the patient.  Provider to assess patient's anxiety based on the dosage adjustment made to her Seroquel  and Gabapentin .  An aims assessment was performed with the patient scoring a 3.  Patient reports that she experiences teeth grinding but is unsure if these movements are attributed to irritability.  Due to patient only expressing involuntary movements in the form of teeth grinding, patient refused medication management at this time.  Patient denies suicidal ideations and is able to contract for safety following the conclusion of the encounter.  Due to her use of Seroquel , labs to be obtained from the patient during her next encounter.  Patient's pulse and blood pressure were obtained prior to the conclusion of the encounter.  Patient had a pulse of 42 bpm and a blood pressure of 80/43 mmHg. Patient denied experiencing any lightheadedness or dizziness prior to the conclusion of the encounter.  Due to patient not having a primary care provider, provider to refer patient out to a primary care facility.  Collaboration of Care: Collaboration of Care: Medication Management AEB provider managing patient's psychiatric medications, Primary Care Provider AEB patient to be referred to a primary care provider, and Psychiatrist AEB patient being followed by a mental health provider  Patient/Guardian was advised  Release of Information must be obtained prior to any record release in order to collaborate their care with an outside provider. Patient/Guardian was advised if they have not already done so to contact the registration department to sign all necessary forms in order for us  to release information regarding their care.   Consent: Patient/Guardian gives verbal consent for treatment and assignment of benefits for services provided during this visit. Patient/Guardian expressed understanding and agreed to proceed.   1. Bipolar affective disorder, currently depressed, moderate (HCC)  - QUEtiapine  (SEROQUEL ) 50 MG tablet; Take 1 tablet (50 mg total) by mouth at bedtime for 4 days, THEN 2 tablets (100 mg total) at bedtime for 4 days, THEN 3 tablets (150 mg total) at bedtime for 4 days.  Dispense: 24 tablet; Refill: 0 - QUEtiapine  (SEROQUEL ) 200 MG tablet; Take 1 tablet (200 mg total) by mouth at bedtime.  Dispense: 30 tablet; Refill: 1  2. Generalized anxiety disorder  - gabapentin  (NEURONTIN ) 100 MG capsule; Take 1 capsule (100 mg total) by mouth 3 (three) times daily for 2 days, THEN 2 capsules (200 mg total) 3 (three) times daily for 2 days.  Dispense: 18 capsule; Refill: 0 - FLUoxetine  (PROZAC ) 20 MG capsule; Take 1 capsule (20 mg total) by mouth daily.  Dispense: 30 capsule; Refill: 1 - gabapentin  (NEURONTIN ) 300 MG capsule; Take 1 capsule (300 mg total) by mouth 3 (three) times daily.  Dispense: 90 capsule; Refill: 1  3. Attention deficit  hyperactivity disorder (ADHD), unspecified ADHD type (Primary)  - atomoxetine  (STRATTERA ) 10 MG capsule; Take 1 capsule (10 mg total) by mouth daily.  Dispense: 90 capsule; Refill: 1  4. Does not have primary care provider  - Ambulatory referral to Internal Medicine  Patient to follow up in 5 weeks Provider spent a total of 37 minutes with the patient/reviewing the patient's chart  Gates Kasal, PA 06/01/2023, 11:31 AM

## 2023-06-08 DIAGNOSIS — F1120 Opioid dependence, uncomplicated: Secondary | ICD-10-CM | POA: Diagnosis not present

## 2023-06-15 DIAGNOSIS — F1120 Opioid dependence, uncomplicated: Secondary | ICD-10-CM | POA: Diagnosis not present

## 2023-06-22 DIAGNOSIS — F1120 Opioid dependence, uncomplicated: Secondary | ICD-10-CM | POA: Diagnosis not present

## 2023-07-06 ENCOUNTER — Ambulatory Visit (INDEPENDENT_AMBULATORY_CARE_PROVIDER_SITE_OTHER): Admitting: Physician Assistant

## 2023-07-06 ENCOUNTER — Encounter (HOSPITAL_COMMUNITY): Payer: Self-pay | Admitting: Physician Assistant

## 2023-07-06 VITALS — BP 75/52 | HR 56 | Temp 97.6°F | Ht 70.5 in | Wt 178.6 lb

## 2023-07-06 DIAGNOSIS — F3132 Bipolar disorder, current episode depressed, moderate: Secondary | ICD-10-CM

## 2023-07-06 DIAGNOSIS — Z79899 Other long term (current) drug therapy: Secondary | ICD-10-CM | POA: Diagnosis not present

## 2023-07-06 DIAGNOSIS — F411 Generalized anxiety disorder: Secondary | ICD-10-CM | POA: Diagnosis not present

## 2023-07-06 DIAGNOSIS — F909 Attention-deficit hyperactivity disorder, unspecified type: Secondary | ICD-10-CM | POA: Diagnosis not present

## 2023-07-06 MED ORDER — ATOMOXETINE HCL 10 MG PO CAPS: 10.0000 mg | ORAL_CAPSULE | Freq: Every day | ORAL | 1 refills | Status: AC

## 2023-07-06 MED ORDER — GABAPENTIN 300 MG PO CAPS: 300.0000 mg | ORAL_CAPSULE | Freq: Three times a day (TID) | ORAL | 1 refills | Status: DC

## 2023-07-06 MED ORDER — FLUOXETINE HCL 20 MG PO CAPS: 20.0000 mg | ORAL_CAPSULE | Freq: Every day | ORAL | 1 refills | Status: DC

## 2023-07-06 MED ORDER — QUETIAPINE FUMARATE 200 MG PO TABS: 200.0000 mg | ORAL_TABLET | Freq: Every day | ORAL | 1 refills | Status: DC

## 2023-07-06 NOTE — Progress Notes (Signed)
 [facility] MD/PA/NP OP Progress Note  07/06/2023 8:18 PM DARBIE BIANCARDI  MRN:  [id number]  Chief Complaint:  Chief Complaint  Patient presents with   Medication Refill   Follow-up   HPI:   Ladelle E. Michelle is a 33 year old female with past psychiatric history significant for attention deficit hyperactivity disorder (unspecified ADHD type), bipolar affective disorder (currently depressed, moderate), and generalized anxiety disorder who presents to [facility] for follow up and medication management. Patient is currently being managed on the following psychiatric medications:  Atomoxetine  10 mg daily Fluoxetine  20 mg daily Seroquel  200 mg at bedtime Gabapentin  300 mg 3 times daily  Patient presents to the encounter stating that she has been taking her medications regularly and denies experiencing any adverse side effects.  Patient denies overt depressive symptoms but does endorse some anxiety and rates her anxiety a 3 out of 10.  Patient denies any new stressors at this time.  A PHQ-9 screen was performed with the patient scoring a 0.  A GAD-7 screen was also performed with the patient scoring a 0.  Patient is alert and oriented x 4, calm, cooperative, and fully engaged in conversation during the encounter.  Patient endorses good mood.  Patient exhibits euthymic mood with appropriate affect.  Patient denies suicidal or homicidal ideations.  She further denies auditory or visual hallucinations and does not appear to be responding to internal/external stimuli.  Patient endorses good sleep and receives on average 8 to 10 hours of sleep per night.  Patient endorses fair appetite and eats on average 1-2 meals per day.  Patient denies alcohol consumption or illicit drug use.  Patient endorses tobacco use and smokes on average 5 to 10 cigarettes/day.  Visit Diagnosis:    ICD-10-CM   1. Attention deficit hyperactivity disorder (ADHD), unspecified ADHD type  F90.9  atomoxetine  (STRATTERA ) 10 MG capsule    2. Generalized anxiety disorder  F41.1 gabapentin  (NEURONTIN ) 300 MG capsule    FLUoxetine  (PROZAC ) 20 MG capsule    3. Bipolar affective disorder, currently depressed, moderate (HCC)  F31.32 QUEtiapine  (SEROQUEL ) 200 MG tablet     Past Psychiatric History:  Patient endorses a past psychiatric history significant for PTSD, bipolar disorder, depression, and ADHD   Patient endorses a past history of hospitalization due to mental health.  Patient reports that she was hospitalized at [facility] voluntarily after her ex was released from prison.  Patient states that she voluntarily admitted herself to receive resources that would increase her and ex's chances for success.   Patient denies a past history of suicide attempt   Patient denies a past history of homicide attempts  Past Medical History:  Past Medical History:  Diagnosis Date   Anxiety    Atrial septal defect    corrected 1999   Bipolar disorder (HCC)    Depression    Heart murmur    Mental health problem    Self-mutilation    Substance abuse (HCC)     Past Surgical History:  Procedure Laterality Date   ASD REPAIR      Family Psychiatric History:  Patient denies family history of psychiatric illness   Family history of suicide attempts: Patient denies Family history of homicide attempts: Patient denies Family history of substance abuse: Patient denies  Family History:  Family History  Problem Relation Age of Onset   Cancer Mother        breast - both   Arthritis Maternal Grandmother  rheumatoid   Cancer Maternal Grandfather        brain   Alzheimer's disease Paternal Grandmother    Mental illness Neg Hx    Drug abuse Neg Hx    Suicidality Neg Hx     Social History:  Social History   Socioeconomic History   Marital status: Single    Spouse name: Not on file   Number of children: Not on file   Years of education: Not on file   Highest  education level: Not on file  Occupational History   Not on file  Tobacco Use   Smoking status: Every Day    Current packs/day: 2.00    Average packs/day: 2.0 packs/day for 7.0 years (14.0 ttl pk-yrs)    Types: Cigarettes   Smokeless tobacco: Never  Substance and Sexual Activity   Alcohol use: Not Currently    Comment: a few a week   Drug use: Not Currently    Frequency: 3.0 times per week    Types: Marijuana, Cocaine    Comment: States no per pt   Sexual activity: Yes    Birth control/protection: None    Comment: 2 partners  Other Topics Concern   Not on file  Social History Narrative   Not on file   Social Drivers of Health   Financial Resource Strain: Medium Risk (08/19/2022)   Overall Financial Resource Strain (CARDIA)    Difficulty of Paying Living Expenses: Somewhat hard  Food Insecurity: No Food Insecurity (10/01/2021)   Received from [facility], [facility]   Hunger Vital Sign    Worried About Running Out of Food in the Last Year: Never true    Ran Out of Food in the Last Year: Never true  Transportation Needs: No Transportation Needs (08/19/2022)   PRAPARE - Administrator, Civil Service (Medical): No    Lack of Transportation (Non-Medical): No  Physical Activity: Inactive (08/19/2022)   Exercise Vital Sign    Days of Exercise per Week: 0 days    Minutes of Exercise per Session: 0 min  Stress: Stress Concern Present (08/19/2022)   [employer] of Occupational Health - Occupational Stress Questionnaire    Feeling of Stress : Rather much  Social Connections: Socially Isolated (08/19/2022)   Social Connection and Isolation Panel [NHANES]    Frequency of Communication with Friends and Family: More than three times a week    Frequency of Social Gatherings with Friends and Family: More than three times a week    Attends Religious Services: Never    Database administrator or Organizations: No    Attends Banker Meetings: Never     Marital Status: Never married    Allergies: No Known Allergies  Metabolic Disorder Labs: Lab Results  Component Value Date   HGBA1C 5.2 09/26/2015   MPG 103 09/26/2015   Lab Results  Component Value Date   PROLACTIN 32.7 (H) 09/26/2015   Lab Results  Component Value Date   CHOL 139 09/26/2015   TRIG 88 09/26/2015   HDL 56 09/26/2015   CHOLHDL 2.5 09/26/2015   VLDL 18 09/26/2015   LDLCALC 65 09/26/2015   Lab Results  Component Value Date   TSH 0.907 09/26/2015   TSH 2.124 08/10/2012    Therapeutic Level Labs: Lab Results  Component Value Date   LITHIUM  1.14 02/20/2007   LITHIUM  0.29 (L) 02/16/2007   Lab Results  Component Value Date   VALPROATE 66 09/30/2015   VALPROATE 86.3  03/24/2011   No results found for: CBMZ  Current Medications: Current Outpatient Medications  Medication Sig Dispense Refill   albuterol  (VENTOLIN  HFA) 108 (90 Base) MCG/ACT inhaler Inhale 2 puffs into the lungs every 4 (four) hours as needed for wheezing or shortness of breath. 6.7 g 0   atomoxetine  (STRATTERA ) 10 MG capsule Take 1 capsule (10 mg total) by mouth daily. 90 capsule 1   FLUoxetine  (PROZAC ) 20 MG capsule Take 1 capsule (20 mg total) by mouth daily. 30 capsule 1   gabapentin  (NEURONTIN ) 100 MG capsule Take 1 capsule (100 mg total) by mouth 3 (three) times daily for 2 days, THEN 2 capsules (200 mg total) 3 (three) times daily for 2 days. 18 capsule 0   gabapentin  (NEURONTIN ) 300 MG capsule Take 1 capsule (300 mg total) by mouth 3 (three) times daily. 90 capsule 1   QUEtiapine  (SEROQUEL ) 200 MG tablet Take 1 tablet (200 mg total) by mouth at bedtime. 30 tablet 1   QUEtiapine  (SEROQUEL ) 50 MG tablet Take 1 tablet (50 mg total) by mouth at bedtime for 4 days, THEN 2 tablets (100 mg total) at bedtime for 4 days, THEN 3 tablets (150 mg total) at bedtime for 4 days. 24 tablet 0   No current facility-administered medications for this visit.     Musculoskeletal: Strength & Muscle  Tone: within normal limits Gait & Station: normal Patient leans: N/A  Psychiatric Specialty Exam: Review of Systems  Psychiatric/Behavioral:  Negative for decreased concentration, dysphoric mood, hallucinations, self-injury, sleep disturbance and suicidal ideas. The patient is nervous/anxious. The patient is not hyperactive.     Blood pressure (!) 75/52, pulse (!) 56, temperature 97.6 F (36.4 C), temperature source Oral, height 5' 10.5 (1.791 m), weight 178 lb 9.6 oz (81 kg).Body mass index is 25.26 kg/m.  General Appearance: Casual  Eye Contact:  Good  Speech:  Clear and Coherent  Volume:  Normal  Mood:  Euthymic  Affect:  Appropriate  Thought Process:  Coherent, Goal Directed, and Descriptions of Associations: Intact  Orientation:  Full (Time, Place, and Person)  Thought Content: WDL   Suicidal Thoughts:  No  Homicidal Thoughts:  No  Memory:  Immediate;   Good Recent;   Good Remote;   Fair  Judgement:  Fair  Insight:  Fair  Psychomotor Activity:  Normal  Concentration:  Concentration: Fair and Attention Span: Fair  Recall:  Good  Fund of Knowledge: Good  Language: Good  Akathisia:  No  Handed:  Left  AIMS (if indicated): not done  Assets:  Communication Skills Desire for Improvement Housing Physical Health Social Support  ADL's:  Intact  Cognition: WNL  Sleep:  Good   Screenings: AIMS    Flowsheet Row Clinical Support from 07/06/2023 in [facility] Office Visit from 06/01/2023 in [facility] ED to Hosp[facility]Admission (Discharged[facility]  AIMS Total Score 0 3 0      AUDIT    Flowsheet Row ED to Hosp-Admission (Discharged) [facility]  Alcohol Use Disorder Identification Test Final Score (AUDIT) 0      GAD-7    Flowsheet Row Clinical Support from 07/06/2023 in [facility] Office Visit from 06/01/2023 in [facility] Office Visit from 09/22/2022 in [facility] Counselor from 08/19/2022 in [facility] Video Visit from 08/29/2021 in [facility]  Total GAD-7 Score 0 11 16 7 3       PHQ2-9    Flowsheet Row Clinical Support from 07/06/2023 in [facility] Office Visit from 06/01/2023 in [facility] Office Visit from 09/22/2022 in [facility] Counselor from 08/19/2022 in [facility] Video Visit from 08/29/2021 in [facility]  PHQ-2 Total Score 0 2 4 4  0  PHQ-9 Total Score -- 7 17 13 4       Flowsheet Row Clinical Support from 07/06/2023 in [facility] Office Visit from 06/01/2023 in [facility] Office Visit from 09/22/2022 in [facility]  C-SSRS RISK CATEGORY No Risk No Risk No Risk        Assessment and Plan:   Wynne Hedge. Gasiorowski is a 33 year old female with past psychiatric history significant for attention deficit hyperactivity disorder (unspecified ADHD type), bipolar affective disorder (currently depressed, moderate), and generalized anxiety disorder who presents to [facility] for follow up and medication management.  Patient presents to the encounter stating that she has been taking her medications regularly and denies experiencing any adverse side effects.  Patient denies overt depressive symptoms but still continues to endorse minimal anxiety.  A PHQ-9 screen was performed with the patient scoring a 0.  A GAD-7 screen was also performed with the patient scoring a 0.  Patient endorses stability on her current medication regimen and would like to continue taking her medications as prescribed.   Patient's medications to be e-prescribed to pharmacy of choice.  Patient's pulse and blood pressure were obtained prior to the conclusion of the encounter.  Patient had a pulse of 56 bpm and a blood pressure of 84/64 mmHg. A second blood pressure was obtained with the patient having a second reading of 75/52 mmHg. Patient denied experiencing any lightheadedness or dizziness prior to the conclusion of the encounter. Provider referred patient to primary care services during her last encounter.  Patient denies suicidal ideations and is able to contract for safety following the conclusion of the encounter.  Due to her use of Seroquel , following labs to be obtained from the patient: Hemoglobin A1c, lipid profile, complete metabolic panel, and complete blood count with differential.  Patient to be referred to cardiology for EKG.  Patient vocalized understanding.  Collaboration of Care: Collaboration of Care: Medication Management AEB provider managing patient's psychiatric medications, Primary Care Provider AEB patient to be referred to a primary care provider, and Psychiatrist AEB patient being followed by a mental health provider  Patient/Guardian was advised Release of Information must be obtained prior to any record release in order to collaborate their care with an outside provider. Patient/Guardian was advised if they have not already done so to contact the registration department to sign all necessary forms in order for us  to release information regarding their care.   Consent: Patient/Guardian gives verbal consent for treatment and assignment of benefits for services provided during this visit. Patient/Guardian expressed understanding and agreed to proceed.   1. Attention deficit hyperactivity disorder (ADHD), unspecified ADHD type  - atomoxetine  (STRATTERA ) 10 MG capsule; Take 1 capsule (10 mg total) by mouth daily.  Dispense: 90 capsule; Refill: 1  2. Generalized anxiety disorder  -  gabapentin  (NEURONTIN ) 300 MG capsule; Take 1 capsule (300 mg total) by mouth 3 (three) times daily.  Dispense: 90 capsule; Refill: 1 - FLUoxetine  (  PROZAC ) 20 MG capsule; Take 1 capsule (20 mg total) by mouth daily.  Dispense: 30 capsule; Refill: 1  3. Bipolar affective disorder, currently depressed, moderate (HCC)  - QUEtiapine  (SEROQUEL ) 200 MG tablet; Take 1 tablet (200 mg total) by mouth at bedtime.  Dispense: 30 tablet; Refill: 1  4. Long term current use of antipsychotic medication (Primary) Patient to be referred to cardiology for EKG  - Hemoglobin A1c; Future - Lipid Profile; Future - Comprehensive metabolic panel with GFR; Future - CBC with Differential/Platelet; Future - Ambulatory referral to Cardiology  Patient to follow up in 6 weeks Provider spent a total of 12 minutes with the patient/reviewing the patient's chart  Gates Kasal, PA 07/06/2023, 8:18 PM

## 2023-07-19 DIAGNOSIS — F1421 Cocaine dependence, in remission: Secondary | ICD-10-CM | POA: Diagnosis not present

## 2023-07-19 DIAGNOSIS — F319 Bipolar disorder, unspecified: Secondary | ICD-10-CM | POA: Diagnosis not present

## 2023-07-19 DIAGNOSIS — F909 Attention-deficit hyperactivity disorder, unspecified type: Secondary | ICD-10-CM | POA: Diagnosis not present

## 2023-07-19 DIAGNOSIS — F1121 Opioid dependence, in remission: Secondary | ICD-10-CM | POA: Diagnosis not present

## 2023-07-19 DIAGNOSIS — F411 Generalized anxiety disorder: Secondary | ICD-10-CM | POA: Diagnosis not present

## 2023-07-19 DIAGNOSIS — F17210 Nicotine dependence, cigarettes, uncomplicated: Secondary | ICD-10-CM | POA: Diagnosis not present

## 2023-07-19 DIAGNOSIS — M549 Dorsalgia, unspecified: Secondary | ICD-10-CM | POA: Diagnosis not present

## 2023-07-19 DIAGNOSIS — Z833 Family history of diabetes mellitus: Secondary | ICD-10-CM | POA: Diagnosis not present

## 2023-08-14 DIAGNOSIS — J029 Acute pharyngitis, unspecified: Secondary | ICD-10-CM | POA: Diagnosis not present

## 2023-08-14 DIAGNOSIS — N390 Urinary tract infection, site not specified: Secondary | ICD-10-CM | POA: Diagnosis not present

## 2023-08-14 DIAGNOSIS — R070 Pain in throat: Secondary | ICD-10-CM | POA: Diagnosis not present

## 2023-08-14 DIAGNOSIS — R82998 Other abnormal findings in urine: Secondary | ICD-10-CM | POA: Diagnosis not present

## 2023-08-19 ENCOUNTER — Encounter: Admitting: Obstetrics and Gynecology

## 2023-09-09 ENCOUNTER — Encounter (HOSPITAL_COMMUNITY): Admitting: Physician Assistant

## 2023-09-21 ENCOUNTER — Telehealth (HOSPITAL_COMMUNITY): Payer: Self-pay | Admitting: Licensed Clinical Social Worker

## 2023-09-21 NOTE — Telephone Encounter (Signed)
 The therapist receives a ROI from Ms. [name], [facility] Case Manager and responds via the following, secure email:  Good afternoon,  [name] was seen for a medication management appointment on 07/06/23 by Mr. Reginia Bolster, PA-C; however, she no showed for her follow-up appointment with him on 09/09/23.  She met with Mr. Juliene Cong, LCSW for a Comprehensive Clinical Assessment o 08/19/23 and has a scheduled therapy appointment with him on 10/26/23.  Sincerely,    Zell Maier, MA, LCSW, [facility], LCAS 09/21/2023

## 2023-10-05 ENCOUNTER — Ambulatory Visit (HOSPITAL_COMMUNITY): Admitting: Licensed Clinical Social Worker

## 2023-10-14 ENCOUNTER — Telehealth (HOSPITAL_COMMUNITY): Payer: Self-pay

## 2023-10-14 NOTE — Telephone Encounter (Signed)
 This therapist receives a ROI on this patient from Wilhemenia Lunger, [facility] with [facility] [employer] [facility] requesting an update regarding services. This therapist sends the following secure email:  Good afternoon, [name] has an upcoming Comprehensive Clinical Assessment with Juliene Cong, LCSW on 10-26-23.  She has no other scheduled appointments at this time. Thank you  Darice Simpler, MS, LMFT, LCAS

## 2023-10-26 ENCOUNTER — Encounter (HOSPITAL_COMMUNITY): Payer: Self-pay

## 2023-10-26 ENCOUNTER — Ambulatory Visit (HOSPITAL_COMMUNITY): Payer: MEDICAID | Admitting: Licensed Clinical Social Worker

## 2023-12-08 ENCOUNTER — Other Ambulatory Visit (HOSPITAL_COMMUNITY): Payer: Self-pay | Admitting: Physician Assistant

## 2023-12-08 DIAGNOSIS — F411 Generalized anxiety disorder: Secondary | ICD-10-CM

## 2024-01-01 ENCOUNTER — Other Ambulatory Visit (HOSPITAL_COMMUNITY): Payer: Self-pay | Admitting: Physician Assistant

## 2024-01-01 DIAGNOSIS — F411 Generalized anxiety disorder: Secondary | ICD-10-CM

## 2024-01-12 ENCOUNTER — Telehealth (INDEPENDENT_AMBULATORY_CARE_PROVIDER_SITE_OTHER): Payer: MEDICAID | Admitting: Physician Assistant

## 2024-01-12 ENCOUNTER — Encounter (HOSPITAL_COMMUNITY): Payer: Self-pay | Admitting: Physician Assistant

## 2024-01-12 DIAGNOSIS — F411 Generalized anxiety disorder: Secondary | ICD-10-CM | POA: Diagnosis not present

## 2024-01-12 DIAGNOSIS — F3132 Bipolar disorder, current episode depressed, moderate: Secondary | ICD-10-CM

## 2024-01-12 MED ORDER — QUETIAPINE FUMARATE 200 MG PO TABS: 200.0000 mg | ORAL_TABLET | Freq: Every day | ORAL | 1 refills | Status: AC

## 2024-01-12 MED ORDER — GABAPENTIN 400 MG PO CAPS: 400.0000 mg | ORAL_CAPSULE | Freq: Three times a day (TID) | ORAL | 1 refills | Status: AC

## 2024-01-12 MED ORDER — FLUOXETINE HCL 20 MG PO CAPS: 20.0000 mg | ORAL_CAPSULE | Freq: Every day | ORAL | 1 refills | Status: AC

## 2024-01-12 NOTE — Progress Notes (Signed)
 BH MD/PA/NP OP Progress Note  Virtual Visit via Video Note  I connected with [name] on 01/12/2024 at  5:30 PM EST by a video enabled telemedicine application and verified that I am speaking with the correct person using two identifiers.  Location: Patient: Home Provider: Clinic   I discussed the limitations of evaluation and management by telemedicine and the availability of in person appointments. The patient expressed understanding and agreed to proceed.  Follow Up Instructions:  I discussed the assessment and treatment plan with the patient. The patient was provided an opportunity to ask questions and all were answered. The patient agreed with the plan and demonstrated an understanding of the instructions.   The patient was advised to call back or seek an in-person evaluation if the symptoms worsen or if the condition fails to improve as anticipated.  I provided 19 minutes of non-face-to-face time during this encounter.  Reginia FORBES Bolster, PA    01/12/2024 7:45 PM [name]  MRN:  [id number]  Chief Complaint:  Chief Complaint  Patient presents with   Follow-up   Medication Management   HPI: ***  [name] ***  Visit Diagnosis:    ICD-10-CM   1. Generalized anxiety disorder  F41.1 gabapentin  (NEURONTIN ) 400 MG capsule    FLUoxetine  (PROZAC ) 20 MG capsule    2. Bipolar affective disorder, currently depressed, moderate (HCC)  F31.32 QUEtiapine  (SEROQUEL ) 200 MG tablet      Past Psychiatric History:  Patient endorses a past psychiatric history significant for PTSD, bipolar disorder, depression, and ADHD   Patient endorses a past history of hospitalization due to mental health.  Patient reports that she was hospitalized at [facility] voluntarily after her ex was released from prison.  Patient states that she voluntarily admitted herself to receive resources that would increase her and ex's chances for success.   Patient denies a past  history of suicide attempt   Patient denies a past history of homicide attempts  Past Medical History:  Past Medical History:  Diagnosis Date   Anxiety    Atrial septal defect    corrected 1999   Bipolar disorder (HCC)    Depression    Heart murmur    Mental health problem    Self-mutilation    Substance abuse (HCC)     Past Surgical History:  Procedure Laterality Date   ASD REPAIR      Family Psychiatric History:  Patient denies family history of psychiatric illness   Family history of suicide attempts: Patient denies Family history of homicide attempts: Patient denies Family history of substance abuse: Patient denies  Family History:  Family History  Problem Relation Age of Onset   Cancer Mother        breast - both   Arthritis Maternal Grandmother        rheumatoid   Cancer Maternal Grandfather        brain   Alzheimer's disease Paternal Grandmother    Mental illness Neg Hx    Drug abuse Neg Hx    Suicidality Neg Hx     Social History:  Social History   Socioeconomic History   Marital status: Single    Spouse name: Not on file   Number of children: Not on file   Years of education: Not on file   Highest education level: Not on file  Occupational History   Not on file  Tobacco Use   Smoking status: Every Day    Current  packs/day: 2.00    Average packs/day: 2.0 packs/day for 7.0 years (14.0 ttl pk-yrs)    Types: Cigarettes   Smokeless tobacco: Never  Substance and Sexual Activity   Alcohol use: Not Currently    Comment: a few a week   Drug use: Not Currently    Frequency: 3.0 times per week    Types: Marijuana, Cocaine    Comment: States no per pt   Sexual activity: Yes    Birth control/protection: None    Comment: 2 partners  Other Topics Concern   Not on file  Social History Narrative   Not on file   Social Drivers of Health   Tobacco Use: High Risk (01/12/2024)   Patient History    Smoking Tobacco Use: Every Day    Smokeless Tobacco  Use: Never    Passive Exposure: Not on file  Financial Resource Strain: Medium Risk (08/19/2022)   Overall Financial Resource Strain (CARDIA)    Difficulty of Paying Living Expenses: Somewhat hard  Food Insecurity: No Food Insecurity (10/01/2021)   Received from [facility]   Epic    Within the past 12 months, you worried that your food would run out before you got the money to buy more.: Never true    Within the past 12 months, the food you bought just didn't last and you didn't have money to get more.: Never true  Transportation Needs: No Transportation Needs (08/19/2022)   PRAPARE - Administrator, Civil Service (Medical): No    Lack of Transportation (Non-Medical): No  Physical Activity: Inactive (08/19/2022)   Exercise Vital Sign    Days of Exercise per Week: 0 days    Minutes of Exercise per Session: 0 min  Stress: Stress Concern Present (08/19/2022)   [employer] of Occupational Health - Occupational Stress Questionnaire    Feeling of Stress : Rather much  Social Connections: Socially Isolated (08/19/2022)   Social Connection and Isolation Panel    Frequency of Communication with Friends and Family: More than three times a week    Frequency of Social Gatherings with Friends and Family: More than three times a week    Attends Religious Services: Never    Database Administrator or Organizations: No    Attends Banker Meetings: Never    Marital Status: Never married  Depression (PHQ2-9): Low Risk (01/12/2024)   Depression (PHQ2-9)    PHQ-2 Score: 1  Alcohol Screen: Low Risk (08/19/2022)   Alcohol Screen    Last Alcohol Screening Score (AUDIT): 1  Housing: Low Risk (08/19/2022)   Housing    Last Housing Risk Score: 0  Utilities: Not At Risk (08/19/2022)   AHC Utilities    Threatened with loss of utilities: No  Health Literacy: Inadequate Health Literacy (08/19/2022)   B1300 Health Literacy    Frequency of need for help with medical instructions:  Often    Allergies: Allergies[1]  Metabolic Disorder Labs: Lab Results  Component Value Date   HGBA1C 5.2 09/26/2015   MPG 103 09/26/2015   Lab Results  Component Value Date   PROLACTIN 32.7 (H) 09/26/2015   Lab Results  Component Value Date   CHOL 139 09/26/2015   TRIG 88 09/26/2015   HDL 56 09/26/2015   CHOLHDL 2.5 09/26/2015   VLDL 18 09/26/2015   LDLCALC 65 09/26/2015   Lab Results  Component Value Date   TSH 0.907 09/26/2015   TSH 2.124 08/10/2012    Therapeutic Level Labs: Lab Results  Component Value Date   LITHIUM  1.14 02/20/2007   LITHIUM  0.29 (L) 02/16/2007   Lab Results  Component Value Date   VALPROATE 66 09/30/2015   VALPROATE 86.3 03/24/2011   No results found for: CBMZ  Current Medications: Current Outpatient Medications  Medication Sig Dispense Refill   albuterol  (VENTOLIN  HFA) 108 (90 Base) MCG/ACT inhaler Inhale 2 puffs into the lungs every 4 (four) hours as needed for wheezing or shortness of breath. 6.7 g 0   atomoxetine  (STRATTERA ) 10 MG capsule Take 1 capsule (10 mg total) by mouth daily. 90 capsule 1   FLUoxetine  (PROZAC ) 20 MG capsule Take 1 capsule (20 mg total) by mouth daily. 30 capsule 1   gabapentin  (NEURONTIN ) 100 MG capsule Take 1 capsule (100 mg total) by mouth 3 (three) times daily for 2 days, THEN 2 capsules (200 mg total) 3 (three) times daily for 2 days. 18 capsule 0   gabapentin  (NEURONTIN ) 400 MG capsule Take 1 capsule (400 mg total) by mouth 3 (three) times daily. 90 capsule 1   QUEtiapine  (SEROQUEL ) 200 MG tablet Take 1 tablet (200 mg total) by mouth at bedtime. 30 tablet 1   QUEtiapine  (SEROQUEL ) 50 MG tablet Take 1 tablet (50 mg total) by mouth at bedtime for 4 days, THEN 2 tablets (100 mg total) at bedtime for 4 days, THEN 3 tablets (150 mg total) at bedtime for 4 days. 24 tablet 0   No current facility-administered medications for this visit.     Musculoskeletal: Strength & Muscle Tone: within normal limits Gait  & Station: normal Patient leans: N/A  Psychiatric Specialty Exam: Review of Systems  Psychiatric/Behavioral:  Negative for decreased concentration, dysphoric mood, hallucinations, self-injury, sleep disturbance and suicidal ideas. The patient is nervous/anxious. The patient is not hyperactive.     There were no vitals taken for this visit.There is no height or weight on file to calculate BMI.  General Appearance: Casual  Eye Contact:  Good  Speech:  Clear and Coherent and Normal Rate  Volume:  Normal  Mood:  Euthymic  Affect:  Appropriate  Thought Process:  Coherent, Goal Directed, and Descriptions of Associations: Intact  Orientation:  Full (Time, Place, and Person)  Thought Content: WDL   Suicidal Thoughts:  No  Homicidal Thoughts:  No  Memory:  Immediate;   Good Recent;   Good Remote;   Fair  Judgement:  Fair  Insight:  Fair  Psychomotor Activity:  Normal  Concentration:  Concentration: Fair and Attention Span: Fair  Recall:  Good  Fund of Knowledge: Good  Language: Good  Akathisia:  No  Handed:  Left  AIMS (if indicated): not done  Assets:  Communication Skills Desire for Improvement Housing Physical Health Social Support  ADL's:  Intact  Cognition: WNL  Sleep:  Good   Screenings: AIMS    Flowsheet Row Video Visit from 01/12/2024 in [facility] Clinical Support from 07/06/2023 in [facility] Office Visit from 06/01/2023 in [facility] ED to Hosp-Admission (Discharged[facility]  AIMS Total Score 0 0 3 0   AUDIT    Flowsheet Row ED to Hosp-Admission (Discharged) [facility]  Alcohol Use Disorder Identification Test Final Score (AUDIT) 0   GAD-7    Flowsheet Row Video Visit from 01/12/2024 in [facility] Clinical Support from 07/06/2023 in  [facility] Office  Visit from 06/01/2023 in [facility] Office Visit from [facility] Counselor from 08/19/2022 in [facility]  Total GAD-7 Score 4 0 11 16 7    PHQ2-9    Flowsheet Row Video Visit from 01/12/2024 in [facility] Clinical Support from 07/06/2023 in [facility] Office Visit from 06/01/2023 in [facility] Office Visit from 09/22/2022 in [facility] Counselor from 08/19/2022 in [facility]  PHQ-2 Total Score 1 0 2 4 4   PHQ-9 Total Score -- -- 7 17 13    Flowsheet Row Video Visit from 01/12/2024 in [facility] Clinical Support from 07/06/2023 in [facility] Office Visit from 06/01/2023 in [facility]  C-SSRS RISK CATEGORY No Risk No Risk No Risk     Assessment and Plan: ***  ***  Collaboration of Care: Collaboration of Care: Medication Management AEB provider managing patient's psychiatric medications, Psychiatrist AEB patient being followed by a mental health provider at this facility, and Referral or follow-up with counselor/therapist AEB patient being set up with a licensed clinical social worker at this facility  Patient/Guardian was advised Release of Information must be obtained prior to any record release in order to collaborate their care with an outside provider. Patient/Guardian was advised if they have not already done so to contact the registration department to sign all necessary forms in order for us  to release information regarding their care.   Consent: Patient/Guardian gives verbal consent for treatment and assignment of benefits for services provided during this visit. Patient/Guardian expressed understanding and agreed to  proceed.   1. Generalized anxiety disorder  - gabapentin  (NEURONTIN ) 400 MG capsule; Take 1 capsule (400 mg total) by mouth 3 (three) times daily.  Dispense: 90 capsule; Refill: 1 - FLUoxetine  (PROZAC ) 20 MG capsule; Take 1 capsule (20 mg total) by mouth daily.  Dispense: 30 capsule; Refill: 1  2. Bipolar affective disorder, currently depressed, moderate (HCC)  - QUEtiapine  (SEROQUEL ) 200 MG tablet; Take 1 tablet (200 mg total) by mouth at bedtime.  Dispense: 30 tablet; Refill: 1  Patient to follow up in 6 weeks Provider spent a total of 19 minutes with the patient/reviewing the patient's chart  Reginia FORBES Bolster, PA 01/12/2024, 7:45 PM     [1] No Known Allergies

## 2024-01-17 ENCOUNTER — Encounter (HOSPITAL_COMMUNITY): Payer: Self-pay

## 2024-01-17 ENCOUNTER — Ambulatory Visit (HOSPITAL_COMMUNITY): Payer: MEDICAID

## 2024-01-18 NOTE — Progress Notes (Deleted)
 [name] did not log into session. Later in the day she came into [facility] as a walk in and was seen in person.

## 2024-01-18 NOTE — Progress Notes (Deleted)
 Patient ID: [name], female   DOB: 1990-05-03, 33 y.o.   MRN: [id number]  Patient did not log into session. Later in the day she came into [facility] as a walk in and was seen in person.

## 2024-02-23 ENCOUNTER — Telehealth (HOSPITAL_COMMUNITY): Payer: MEDICAID | Admitting: Physician Assistant

## 2024-02-23 ENCOUNTER — Encounter (HOSPITAL_COMMUNITY): Payer: Self-pay
